# Patient Record
Sex: Female | Born: 1950
Health system: Southern US, Community
[De-identification: ages and names within clinical notes are randomized; demographics above are authoritative.]

## PROBLEM LIST (undated history)

## (undated) DIAGNOSIS — J449 Chronic obstructive pulmonary disease, unspecified: Secondary | ICD-10-CM

## (undated) DIAGNOSIS — Z8719 Personal history of other diseases of the digestive system: Secondary | ICD-10-CM

## (undated) DIAGNOSIS — M545 Low back pain, unspecified: Secondary | ICD-10-CM

## (undated) DIAGNOSIS — IMO0001 Reserved for inherently not codable concepts without codable children: Secondary | ICD-10-CM

## (undated) DIAGNOSIS — Z860100 Personal history of colon polyps, unspecified: Secondary | ICD-10-CM

## (undated) DIAGNOSIS — M1990 Unspecified osteoarthritis, unspecified site: Secondary | ICD-10-CM

## (undated) DIAGNOSIS — I10 Essential (primary) hypertension: Secondary | ICD-10-CM

## (undated) DIAGNOSIS — E669 Obesity, unspecified: Secondary | ICD-10-CM

## (undated) DIAGNOSIS — F450 Somatization disorder: Secondary | ICD-10-CM

## (undated) DIAGNOSIS — N259 Disorder resulting from impaired renal tubular function, unspecified: Secondary | ICD-10-CM

## (undated) DIAGNOSIS — R5383 Other fatigue: Secondary | ICD-10-CM

## (undated) DIAGNOSIS — J45909 Unspecified asthma, uncomplicated: Secondary | ICD-10-CM

## (undated) DIAGNOSIS — J4489 Other specified chronic obstructive pulmonary disease: Secondary | ICD-10-CM

## (undated) DIAGNOSIS — K589 Irritable bowel syndrome without diarrhea: Secondary | ICD-10-CM

## (undated) DIAGNOSIS — I639 Cerebral infarction, unspecified: Secondary | ICD-10-CM

## (undated) DIAGNOSIS — N959 Unspecified menopausal and perimenopausal disorder: Secondary | ICD-10-CM

## (undated) DIAGNOSIS — E785 Hyperlipidemia, unspecified: Secondary | ICD-10-CM

## (undated) DIAGNOSIS — E739 Lactose intolerance, unspecified: Secondary | ICD-10-CM

## (undated) DIAGNOSIS — D509 Iron deficiency anemia, unspecified: Secondary | ICD-10-CM

## (undated) DIAGNOSIS — I2510 Atherosclerotic heart disease of native coronary artery without angina pectoris: Secondary | ICD-10-CM

## (undated) DIAGNOSIS — K219 Gastro-esophageal reflux disease without esophagitis: Secondary | ICD-10-CM

## (undated) DIAGNOSIS — J309 Allergic rhinitis, unspecified: Secondary | ICD-10-CM

## (undated) DIAGNOSIS — F411 Generalized anxiety disorder: Secondary | ICD-10-CM

## (undated) DIAGNOSIS — R609 Edema, unspecified: Secondary | ICD-10-CM

## (undated) DIAGNOSIS — N63 Unspecified lump in unspecified breast: Secondary | ICD-10-CM

## (undated) DIAGNOSIS — R5381 Other malaise: Secondary | ICD-10-CM

## (undated) DIAGNOSIS — E039 Hypothyroidism, unspecified: Secondary | ICD-10-CM

## (undated) DIAGNOSIS — I739 Peripheral vascular disease, unspecified: Secondary | ICD-10-CM

## (undated) HISTORY — DX: Other specified chronic obstructive pulmonary disease: J44.89

## (undated) HISTORY — PX: REPLACEMENT TOTAL KNEE: SUR1224

## (undated) HISTORY — DX: Lactose intolerance, unspecified: E73.9

## (undated) HISTORY — DX: Unspecified lump in unspecified breast: N63.0

## (undated) HISTORY — PX: COLON SURGERY: SHX602

## (undated) HISTORY — DX: Unspecified osteoarthritis, unspecified site: M19.90

## (undated) HISTORY — DX: Hypothyroidism, unspecified: E03.9

## (undated) HISTORY — DX: Allergic rhinitis, unspecified: J30.9

## (undated) HISTORY — DX: Chronic obstructive pulmonary disease, unspecified: J44.9

## (undated) HISTORY — DX: Unspecified asthma, uncomplicated: J45.909

## (undated) HISTORY — DX: Hyperlipidemia, unspecified: E78.5

## (undated) HISTORY — DX: Irritable bowel syndrome, unspecified: K58.9

## (undated) HISTORY — DX: Other fatigue: R53.83

## (undated) HISTORY — DX: Essential (primary) hypertension: I10

## (undated) HISTORY — DX: Low back pain, unspecified: M54.50

## (undated) HISTORY — DX: Somatization disorder: F45.0

## (undated) HISTORY — DX: Low back pain: M54.5

## (undated) HISTORY — DX: Generalized anxiety disorder: F41.1

## (undated) HISTORY — DX: Atherosclerotic heart disease of native coronary artery without angina pectoris: I25.10

## (undated) HISTORY — DX: Disorder resulting from impaired renal tubular function, unspecified: N25.9

## (undated) HISTORY — DX: Peripheral vascular disease, unspecified: I73.9

## (undated) HISTORY — DX: Edema, unspecified: R60.9

## (undated) HISTORY — PX: TONSILLECTOMY: SUR1361

## (undated) HISTORY — PX: CHOLECYSTECTOMY: SHX55

## (undated) HISTORY — DX: Personal history of colon polyps, unspecified: Z86.0100

## (undated) HISTORY — DX: Reserved for inherently not codable concepts without codable children: IMO0001

## (undated) HISTORY — DX: Gastro-esophageal reflux disease without esophagitis: K21.9

## (undated) HISTORY — DX: Obesity, unspecified: E66.9

## (undated) HISTORY — PX: CORONARY STENT PLACEMENT: SHX1402

## (undated) HISTORY — DX: Other fatigue: R53.81

## (undated) HISTORY — PX: ABDOMINAL HYSTERECTOMY: SHX81

## (undated) HISTORY — DX: Personal history of colonic polyps: Z86.010

## (undated) HISTORY — PX: CORONARY ANGIOPLASTY: SHX604

## (undated) HISTORY — DX: Unspecified menopausal and perimenopausal disorder: N95.9

## (undated) HISTORY — DX: Iron deficiency anemia, unspecified: D50.9

## (undated) HISTORY — PX: OOPHORECTOMY: SHX86

## (undated) HISTORY — PX: APPENDECTOMY: SHX54

## (undated) HISTORY — DX: Personal history of other diseases of the digestive system: Z87.19

---

## 1998-10-14 ENCOUNTER — Other Ambulatory Visit: Admission: RE | Admit: 1998-10-14 | Discharge: 1998-10-14 | Payer: Self-pay | Admitting: Gastroenterology

## 1998-10-20 ENCOUNTER — Observation Stay (HOSPITAL_COMMUNITY): Admission: RE | Admit: 1998-10-20 | Discharge: 1998-10-21 | Payer: Self-pay | Admitting: General Surgery

## 1999-11-16 ENCOUNTER — Ambulatory Visit (HOSPITAL_COMMUNITY): Admission: RE | Admit: 1999-11-16 | Discharge: 1999-11-16 | Payer: Self-pay | Admitting: *Deleted

## 2000-01-15 ENCOUNTER — Emergency Department (HOSPITAL_COMMUNITY): Admission: EM | Admit: 2000-01-15 | Discharge: 2000-01-15 | Payer: Self-pay | Admitting: Internal Medicine

## 2000-01-15 ENCOUNTER — Encounter: Payer: Self-pay | Admitting: Emergency Medicine

## 2001-12-19 ENCOUNTER — Encounter: Payer: Self-pay | Admitting: Internal Medicine

## 2001-12-19 ENCOUNTER — Inpatient Hospital Stay (HOSPITAL_COMMUNITY): Admission: EM | Admit: 2001-12-19 | Discharge: 2001-12-25 | Payer: Self-pay | Admitting: Emergency Medicine

## 2001-12-20 ENCOUNTER — Encounter: Payer: Self-pay | Admitting: *Deleted

## 2002-01-28 ENCOUNTER — Encounter: Admission: RE | Admit: 2002-01-28 | Discharge: 2002-01-28 | Payer: Self-pay | Admitting: Internal Medicine

## 2002-01-28 ENCOUNTER — Encounter: Payer: Self-pay | Admitting: Internal Medicine

## 2002-05-20 ENCOUNTER — Ambulatory Visit (HOSPITAL_COMMUNITY): Admission: RE | Admit: 2002-05-20 | Discharge: 2002-05-20 | Payer: Self-pay | Admitting: Specialist

## 2004-10-15 ENCOUNTER — Emergency Department (HOSPITAL_COMMUNITY): Admission: EM | Admit: 2004-10-15 | Discharge: 2004-10-15 | Payer: Self-pay

## 2004-10-23 ENCOUNTER — Ambulatory Visit (HOSPITAL_COMMUNITY): Admission: RE | Admit: 2004-10-23 | Discharge: 2004-10-23 | Payer: Self-pay | Admitting: Urology

## 2004-12-08 ENCOUNTER — Ambulatory Visit: Payer: Self-pay | Admitting: Internal Medicine

## 2004-12-15 ENCOUNTER — Ambulatory Visit: Payer: Self-pay | Admitting: Internal Medicine

## 2004-12-29 ENCOUNTER — Ambulatory Visit: Payer: Self-pay

## 2005-08-07 ENCOUNTER — Emergency Department (HOSPITAL_COMMUNITY): Admission: EM | Admit: 2005-08-07 | Discharge: 2005-08-07 | Payer: Self-pay | Admitting: Emergency Medicine

## 2006-01-18 ENCOUNTER — Ambulatory Visit: Payer: Self-pay | Admitting: Internal Medicine

## 2006-02-15 ENCOUNTER — Ambulatory Visit: Payer: Self-pay | Admitting: Internal Medicine

## 2006-02-21 ENCOUNTER — Ambulatory Visit: Payer: Self-pay | Admitting: Internal Medicine

## 2006-03-22 ENCOUNTER — Ambulatory Visit: Payer: Self-pay | Admitting: Internal Medicine

## 2006-03-25 ENCOUNTER — Inpatient Hospital Stay (HOSPITAL_COMMUNITY): Admission: RE | Admit: 2006-03-25 | Discharge: 2006-03-28 | Payer: Self-pay | Admitting: Orthopedic Surgery

## 2006-06-12 ENCOUNTER — Ambulatory Visit: Payer: Self-pay | Admitting: Cardiology

## 2006-06-12 ENCOUNTER — Inpatient Hospital Stay (HOSPITAL_COMMUNITY): Admission: EM | Admit: 2006-06-12 | Discharge: 2006-06-16 | Payer: Self-pay | Admitting: Cardiology

## 2006-06-19 ENCOUNTER — Ambulatory Visit: Payer: Self-pay | Admitting: Cardiology

## 2006-06-19 ENCOUNTER — Observation Stay (HOSPITAL_COMMUNITY): Admission: EM | Admit: 2006-06-19 | Discharge: 2006-06-20 | Payer: Self-pay | Admitting: Emergency Medicine

## 2006-07-05 ENCOUNTER — Ambulatory Visit: Payer: Self-pay | Admitting: Cardiology

## 2006-07-19 ENCOUNTER — Ambulatory Visit: Payer: Self-pay | Admitting: Cardiology

## 2006-12-13 ENCOUNTER — Ambulatory Visit: Payer: Self-pay | Admitting: Cardiology

## 2006-12-18 ENCOUNTER — Ambulatory Visit: Payer: Self-pay | Admitting: Cardiology

## 2006-12-18 LAB — CONVERTED CEMR LAB
Basophils Absolute: 0.2 10*3/uL — ABNORMAL HIGH (ref 0.0–0.1)
Basophils Relative: 2 % — ABNORMAL HIGH (ref 0.0–1.0)
CO2: 28 meq/L (ref 19–32)
Chloride: 105 meq/L (ref 96–112)
Creatinine, Ser: 0.8 mg/dL (ref 0.4–1.2)
Eosinophils Relative: 1.7 % (ref 0.0–5.0)
Glucose, Bld: 109 mg/dL — ABNORMAL HIGH (ref 70–99)
HCT: 38 % (ref 36.0–46.0)
Hemoglobin: 12.9 g/dL (ref 12.0–15.0)
INR: 0.7 — ABNORMAL LOW (ref 0.9–2.0)
MCHC: 33.8 g/dL (ref 30.0–36.0)
Monocytes Absolute: 0.2 10*3/uL (ref 0.2–0.7)
Neutrophils Relative %: 56.9 % (ref 43.0–77.0)
Potassium: 4.3 meq/L (ref 3.5–5.1)
RBC: 4.59 M/uL (ref 3.87–5.11)
RDW: 15.2 % — ABNORMAL HIGH (ref 11.5–14.6)
Sodium: 140 meq/L (ref 135–145)
WBC: 8.1 10*3/uL (ref 4.5–10.5)
aPTT: 31.7 s (ref 26.5–36.5)

## 2006-12-20 ENCOUNTER — Inpatient Hospital Stay (HOSPITAL_BASED_OUTPATIENT_CLINIC_OR_DEPARTMENT_OTHER): Admission: RE | Admit: 2006-12-20 | Discharge: 2006-12-20 | Payer: Self-pay | Admitting: Cardiology

## 2006-12-20 ENCOUNTER — Ambulatory Visit: Payer: Self-pay | Admitting: Cardiology

## 2007-01-02 ENCOUNTER — Ambulatory Visit: Payer: Self-pay | Admitting: Cardiology

## 2007-01-31 ENCOUNTER — Ambulatory Visit: Payer: Self-pay | Admitting: Cardiology

## 2007-02-12 ENCOUNTER — Ambulatory Visit: Payer: Self-pay | Admitting: Cardiology

## 2007-02-21 ENCOUNTER — Ambulatory Visit: Payer: Self-pay | Admitting: Internal Medicine

## 2007-02-21 LAB — CONVERTED CEMR LAB
ALT: 22 units/L (ref 0–40)
AST: 20 units/L (ref 0–37)
Albumin: 3.1 g/dL — ABNORMAL LOW (ref 3.5–5.2)
Alkaline Phosphatase: 85 units/L (ref 39–117)
Basophils Absolute: 0 10*3/uL (ref 0.0–0.1)
Basophils Relative: 0 % (ref 0.0–1.0)
Calcium: 8.8 mg/dL (ref 8.4–10.5)
Chloride: 109 meq/L (ref 96–112)
Creatinine, Ser: 0.7 mg/dL (ref 0.4–1.2)
Crystals: NEGATIVE
GFR calc non Af Amer: 92 mL/min
HCT: 35.8 % — ABNORMAL LOW (ref 36.0–46.0)
Hemoglobin: 11.9 g/dL — ABNORMAL LOW (ref 12.0–15.0)
MCHC: 33.3 g/dL (ref 30.0–36.0)
Monocytes Absolute: 0.7 10*3/uL (ref 0.2–0.7)
Neutrophils Relative %: 76.4 % (ref 43.0–77.0)
RBC: 4.26 M/uL (ref 3.87–5.11)
RDW: 13.6 % (ref 11.5–14.6)
Specific Gravity, Urine: >=1.03 (ref 1.000–1.03)
Total CHOL/HDL Ratio: 3.6
Total Protein, Urine: 30 mg/dL — AB
Triglycerides: 115 mg/dL (ref 0–149)
VLDL: 23 mg/dL (ref 0–40)
WBC: 11.3 10*3/uL — ABNORMAL HIGH (ref 4.5–10.5)
pH: 6 (ref 5.0–8.0)

## 2007-02-28 ENCOUNTER — Ambulatory Visit: Payer: Self-pay | Admitting: Internal Medicine

## 2007-04-11 ENCOUNTER — Ambulatory Visit: Payer: Self-pay | Admitting: Cardiology

## 2007-06-17 ENCOUNTER — Ambulatory Visit: Payer: Self-pay | Admitting: Cardiovascular Disease

## 2007-06-18 ENCOUNTER — Inpatient Hospital Stay (HOSPITAL_COMMUNITY): Admission: EM | Admit: 2007-06-18 | Discharge: 2007-06-20 | Payer: Self-pay | Admitting: Emergency Medicine

## 2007-06-18 ENCOUNTER — Ambulatory Visit: Payer: Self-pay | Admitting: Cardiology

## 2007-07-02 ENCOUNTER — Ambulatory Visit: Payer: Self-pay | Admitting: Cardiovascular Disease

## 2007-07-02 LAB — CONVERTED CEMR LAB: CK-MB: 1.5 ng/mL (ref 0.3–4.0)

## 2007-07-09 ENCOUNTER — Ambulatory Visit: Payer: Self-pay | Admitting: Internal Medicine

## 2007-07-16 ENCOUNTER — Ambulatory Visit: Payer: Self-pay | Admitting: Cardiovascular Disease

## 2007-08-11 ENCOUNTER — Inpatient Hospital Stay (HOSPITAL_COMMUNITY): Admission: RE | Admit: 2007-08-11 | Discharge: 2007-08-15 | Payer: Self-pay | Admitting: Orthopedic Surgery

## 2007-08-11 ENCOUNTER — Ambulatory Visit: Payer: Self-pay | Admitting: Cardiovascular Disease

## 2007-08-20 ENCOUNTER — Telehealth: Payer: Self-pay | Admitting: Internal Medicine

## 2007-08-20 ENCOUNTER — Telehealth (INDEPENDENT_AMBULATORY_CARE_PROVIDER_SITE_OTHER): Payer: Self-pay | Admitting: *Deleted

## 2007-08-23 ENCOUNTER — Encounter: Payer: Self-pay | Admitting: Internal Medicine

## 2007-08-23 DIAGNOSIS — E669 Obesity, unspecified: Secondary | ICD-10-CM | POA: Insufficient documentation

## 2007-08-23 DIAGNOSIS — I2510 Atherosclerotic heart disease of native coronary artery without angina pectoris: Secondary | ICD-10-CM

## 2007-08-23 DIAGNOSIS — J4489 Other specified chronic obstructive pulmonary disease: Secondary | ICD-10-CM | POA: Insufficient documentation

## 2007-08-23 DIAGNOSIS — E039 Hypothyroidism, unspecified: Secondary | ICD-10-CM | POA: Insufficient documentation

## 2007-08-23 DIAGNOSIS — F450 Somatization disorder: Secondary | ICD-10-CM

## 2007-08-23 DIAGNOSIS — M1990 Unspecified osteoarthritis, unspecified site: Secondary | ICD-10-CM | POA: Insufficient documentation

## 2007-08-23 DIAGNOSIS — E785 Hyperlipidemia, unspecified: Secondary | ICD-10-CM

## 2007-08-23 DIAGNOSIS — I252 Old myocardial infarction: Secondary | ICD-10-CM | POA: Insufficient documentation

## 2007-08-23 DIAGNOSIS — M545 Low back pain: Secondary | ICD-10-CM

## 2007-08-23 DIAGNOSIS — I10 Essential (primary) hypertension: Secondary | ICD-10-CM | POA: Insufficient documentation

## 2007-08-23 DIAGNOSIS — F411 Generalized anxiety disorder: Secondary | ICD-10-CM | POA: Insufficient documentation

## 2007-08-23 DIAGNOSIS — K589 Irritable bowel syndrome without diarrhea: Secondary | ICD-10-CM | POA: Insufficient documentation

## 2007-08-23 DIAGNOSIS — J45909 Unspecified asthma, uncomplicated: Secondary | ICD-10-CM | POA: Insufficient documentation

## 2007-08-23 DIAGNOSIS — J449 Chronic obstructive pulmonary disease, unspecified: Secondary | ICD-10-CM | POA: Insufficient documentation

## 2007-08-23 DIAGNOSIS — IMO0001 Reserved for inherently not codable concepts without codable children: Secondary | ICD-10-CM

## 2007-08-23 HISTORY — DX: Essential (primary) hypertension: I10

## 2007-08-23 HISTORY — DX: Somatization disorder: F45.0

## 2007-08-23 HISTORY — DX: Hyperlipidemia, unspecified: E78.5

## 2008-06-07 ENCOUNTER — Ambulatory Visit: Payer: Self-pay | Admitting: Internal Medicine

## 2008-06-07 ENCOUNTER — Telehealth (INDEPENDENT_AMBULATORY_CARE_PROVIDER_SITE_OTHER): Payer: Self-pay | Admitting: *Deleted

## 2008-06-07 DIAGNOSIS — E739 Lactose intolerance, unspecified: Secondary | ICD-10-CM

## 2008-06-07 DIAGNOSIS — Z8719 Personal history of other diseases of the digestive system: Secondary | ICD-10-CM

## 2008-06-07 DIAGNOSIS — R5383 Other fatigue: Secondary | ICD-10-CM

## 2008-06-07 DIAGNOSIS — K219 Gastro-esophageal reflux disease without esophagitis: Secondary | ICD-10-CM

## 2008-06-07 DIAGNOSIS — J309 Allergic rhinitis, unspecified: Secondary | ICD-10-CM | POA: Insufficient documentation

## 2008-06-07 DIAGNOSIS — R5381 Other malaise: Secondary | ICD-10-CM

## 2008-06-07 DIAGNOSIS — R079 Chest pain, unspecified: Secondary | ICD-10-CM | POA: Insufficient documentation

## 2008-06-07 DIAGNOSIS — D509 Iron deficiency anemia, unspecified: Secondary | ICD-10-CM | POA: Insufficient documentation

## 2008-06-07 DIAGNOSIS — Z860100 Personal history of colon polyps, unspecified: Secondary | ICD-10-CM | POA: Insufficient documentation

## 2008-06-07 LAB — CONVERTED CEMR LAB
Albumin: 3.8 g/dL (ref 3.5–5.2)
Alkaline Phosphatase: 70 units/L (ref 39–117)
BUN: 8 mg/dL (ref 6–23)
Basophils Absolute: 0 10*3/uL (ref 0.0–0.1)
Cholesterol: 262 mg/dL (ref 0–200)
Folate: 6.8 ng/mL
GFR calc Af Amer: 111 mL/min
GFR calc non Af Amer: 92 mL/min
HDL: 41.9 mg/dL (ref >39.0)
Hemoglobin: 13 g/dL (ref 12.0–15.0)
Hgb A1c MFr Bld: 6.5 % — ABNORMAL HIGH (ref 4.6–6.0)
Iron: 31 ug/dL — ABNORMAL LOW (ref 42–145)
Lymphocytes Relative: 31.6 % (ref 12.0–46.0)
MCHC: 34 g/dL (ref 30.0–36.0)
Neutro Abs: 4.8 10*3/uL (ref 1.4–7.7)
Neutrophils Relative %: 58.7 % (ref 43.0–77.0)
Platelets: 373 10*3/uL (ref 150–400)
Potassium: 4.6 meq/L (ref 3.5–5.1)
RDW: 14.3 % (ref 11.5–14.6)
Saturation Ratios: 7.3 % — ABNORMAL LOW (ref 20.0–50.0)
Triglycerides: 176 mg/dL — ABNORMAL HIGH (ref 0–149)
VLDL: 35 mg/dL (ref 0–40)
Vitamin B-12: 221 pg/mL (ref 211–911)

## 2008-06-08 ENCOUNTER — Telehealth (INDEPENDENT_AMBULATORY_CARE_PROVIDER_SITE_OTHER): Payer: Self-pay | Admitting: *Deleted

## 2008-06-14 LAB — CONVERTED CEMR LAB: Vit D, 1,25-Dihydroxy: 17 — ABNORMAL LOW (ref 30–89)

## 2008-06-24 ENCOUNTER — Ambulatory Visit: Payer: Self-pay | Admitting: Cardiovascular Disease

## 2008-08-04 ENCOUNTER — Encounter: Payer: Self-pay | Admitting: Internal Medicine

## 2008-08-06 ENCOUNTER — Encounter: Admission: RE | Admit: 2008-08-06 | Discharge: 2008-08-06 | Payer: Self-pay | Admitting: Gastroenterology

## 2008-08-12 ENCOUNTER — Encounter: Payer: Self-pay | Admitting: Internal Medicine

## 2008-08-18 ENCOUNTER — Encounter: Payer: Self-pay | Admitting: Internal Medicine

## 2008-08-25 ENCOUNTER — Encounter: Payer: Self-pay | Admitting: Internal Medicine

## 2008-08-27 ENCOUNTER — Encounter: Admission: RE | Admit: 2008-08-27 | Discharge: 2008-08-27 | Payer: Self-pay | Admitting: Gastroenterology

## 2008-09-02 ENCOUNTER — Encounter: Admission: RE | Admit: 2008-09-02 | Discharge: 2008-09-02 | Payer: Self-pay | Admitting: Gastroenterology

## 2008-09-02 ENCOUNTER — Encounter: Payer: Self-pay | Admitting: Internal Medicine

## 2008-09-08 ENCOUNTER — Encounter: Payer: Self-pay | Admitting: Internal Medicine

## 2008-09-13 ENCOUNTER — Encounter: Payer: Self-pay | Admitting: Internal Medicine

## 2008-09-17 ENCOUNTER — Ambulatory Visit: Payer: Self-pay | Admitting: Cardiovascular Disease

## 2008-11-04 ENCOUNTER — Encounter (INDEPENDENT_AMBULATORY_CARE_PROVIDER_SITE_OTHER): Payer: Self-pay | Admitting: General Surgery

## 2008-11-04 ENCOUNTER — Inpatient Hospital Stay (HOSPITAL_COMMUNITY): Admission: RE | Admit: 2008-11-04 | Discharge: 2008-11-09 | Payer: Self-pay | Admitting: General Surgery

## 2008-11-29 ENCOUNTER — Inpatient Hospital Stay (HOSPITAL_COMMUNITY): Admission: EM | Admit: 2008-11-29 | Discharge: 2008-12-02 | Payer: Self-pay | Admitting: Emergency Medicine

## 2008-11-29 ENCOUNTER — Encounter: Payer: Self-pay | Admitting: Internal Medicine

## 2009-01-03 ENCOUNTER — Telehealth (INDEPENDENT_AMBULATORY_CARE_PROVIDER_SITE_OTHER): Payer: Self-pay | Admitting: *Deleted

## 2009-02-15 ENCOUNTER — Ambulatory Visit: Payer: Self-pay | Admitting: Internal Medicine

## 2009-02-15 ENCOUNTER — Observation Stay (HOSPITAL_COMMUNITY): Admission: EM | Admit: 2009-02-15 | Discharge: 2009-02-17 | Payer: Self-pay | Admitting: Emergency Medicine

## 2009-02-16 ENCOUNTER — Encounter: Payer: Self-pay | Admitting: Internal Medicine

## 2009-02-18 ENCOUNTER — Telehealth (INDEPENDENT_AMBULATORY_CARE_PROVIDER_SITE_OTHER): Payer: Self-pay | Admitting: *Deleted

## 2009-02-28 ENCOUNTER — Telehealth (INDEPENDENT_AMBULATORY_CARE_PROVIDER_SITE_OTHER): Payer: Self-pay | Admitting: *Deleted

## 2009-03-01 ENCOUNTER — Encounter: Payer: Self-pay | Admitting: Cardiovascular Disease

## 2009-03-01 ENCOUNTER — Ambulatory Visit: Payer: Self-pay

## 2009-03-02 ENCOUNTER — Telehealth (INDEPENDENT_AMBULATORY_CARE_PROVIDER_SITE_OTHER): Payer: Self-pay | Admitting: *Deleted

## 2009-03-02 DIAGNOSIS — E876 Hypokalemia: Secondary | ICD-10-CM | POA: Insufficient documentation

## 2009-03-02 DIAGNOSIS — N259 Disorder resulting from impaired renal tubular function, unspecified: Secondary | ICD-10-CM

## 2009-03-02 HISTORY — DX: Disorder resulting from impaired renal tubular function, unspecified: N25.9

## 2009-03-03 ENCOUNTER — Ambulatory Visit: Payer: Self-pay | Admitting: Cardiovascular Disease

## 2009-03-03 DIAGNOSIS — R0602 Shortness of breath: Secondary | ICD-10-CM | POA: Insufficient documentation

## 2009-03-07 LAB — CONVERTED CEMR LAB
CO2: 30 meq/L (ref 19–32)
Calcium: 9.4 mg/dL (ref 8.4–10.5)
Creatinine, Ser: 0.7 mg/dL (ref 0.4–1.2)
Glucose, Bld: 104 mg/dL — ABNORMAL HIGH (ref 70–99)

## 2009-04-27 ENCOUNTER — Encounter: Admission: RE | Admit: 2009-04-27 | Discharge: 2009-04-27 | Payer: Self-pay | Admitting: General Surgery

## 2009-04-27 ENCOUNTER — Encounter: Payer: Self-pay | Admitting: Internal Medicine

## 2009-05-13 ENCOUNTER — Encounter: Payer: Self-pay | Admitting: Internal Medicine

## 2009-05-16 ENCOUNTER — Encounter: Payer: Self-pay | Admitting: Internal Medicine

## 2009-06-15 ENCOUNTER — Telehealth: Payer: Self-pay | Admitting: Internal Medicine

## 2009-06-15 ENCOUNTER — Encounter: Payer: Self-pay | Admitting: Internal Medicine

## 2009-06-28 ENCOUNTER — Telehealth: Payer: Self-pay | Admitting: Internal Medicine

## 2009-07-01 ENCOUNTER — Telehealth: Payer: Self-pay | Admitting: Cardiovascular Disease

## 2009-07-08 ENCOUNTER — Ambulatory Visit: Payer: Self-pay | Admitting: Internal Medicine

## 2009-07-08 LAB — CONVERTED CEMR LAB
AST: 28 units/L (ref 0–37)
Alkaline Phosphatase: 40 units/L (ref 39–117)
BUN: 21 mg/dL (ref 6–23)
Eosinophils Absolute: 0.2 10*3/uL (ref 0.0–0.7)
GFR calc non Af Amer: 68.31 mL/min (ref >60)
Ketones, ur: NEGATIVE mg/dL
MCHC: 33.4 g/dL (ref 30.0–36.0)
MCV: 88 fL (ref 78.0–100.0)
Monocytes Absolute: 0.5 10*3/uL (ref 0.1–1.0)
Neutrophils Relative %: 53.4 % (ref 43.0–77.0)
Nitrite: NEGATIVE
Platelets: 290 10*3/uL (ref 150.0–400.0)
Potassium: 4.3 meq/L (ref 3.5–5.1)
Sodium: 144 meq/L (ref 135–145)
Specific Gravity, Urine: 1.025 (ref 1.000–1.030)
TSH: 2.79 microintl units/mL (ref 0.35–5.50)
Total Bilirubin: 0.5 mg/dL (ref 0.3–1.2)
Total CHOL/HDL Ratio: 6
Total Protein, Urine: NEGATIVE mg/dL
pH: 6 (ref 5.0–8.0)

## 2009-07-15 ENCOUNTER — Ambulatory Visit: Payer: Self-pay | Admitting: Internal Medicine

## 2009-07-15 DIAGNOSIS — I739 Peripheral vascular disease, unspecified: Secondary | ICD-10-CM

## 2009-07-15 DIAGNOSIS — N959 Unspecified menopausal and perimenopausal disorder: Secondary | ICD-10-CM | POA: Insufficient documentation

## 2009-07-15 HISTORY — DX: Peripheral vascular disease, unspecified: I73.9

## 2009-07-19 ENCOUNTER — Ambulatory Visit: Payer: Self-pay | Admitting: Cardiovascular Disease

## 2009-08-19 ENCOUNTER — Telehealth: Payer: Self-pay | Admitting: Internal Medicine

## 2009-08-19 DIAGNOSIS — N63 Unspecified lump in unspecified breast: Secondary | ICD-10-CM | POA: Insufficient documentation

## 2009-08-26 ENCOUNTER — Encounter: Admission: RE | Admit: 2009-08-26 | Discharge: 2009-08-26 | Payer: Self-pay | Admitting: Internal Medicine

## 2009-11-30 ENCOUNTER — Encounter (INDEPENDENT_AMBULATORY_CARE_PROVIDER_SITE_OTHER): Payer: Self-pay | Admitting: *Deleted

## 2010-07-18 ENCOUNTER — Telehealth: Payer: Self-pay | Admitting: Internal Medicine

## 2010-08-22 ENCOUNTER — Ambulatory Visit: Payer: Self-pay | Admitting: Internal Medicine

## 2010-08-22 LAB — CONVERTED CEMR LAB
Albumin: 3.9 g/dL (ref 3.5–5.2)
Alkaline Phosphatase: 74 units/L (ref 39–117)
Basophils Relative: 0.7 % (ref 0.0–3.0)
CO2: 27 meq/L (ref 19–32)
Chloride: 106 meq/L (ref 96–112)
Direct LDL: 168.9 mg/dL
Eosinophils Absolute: 0.2 10*3/uL (ref 0.0–0.7)
HCT: 38.4 % (ref 36.0–46.0)
Hemoglobin, Urine: NEGATIVE
Hemoglobin: 13 g/dL (ref 12.0–15.0)
Lymphs Abs: 2.5 10*3/uL (ref 0.7–4.0)
MCHC: 33.8 g/dL (ref 30.0–36.0)
MCV: 85.9 fL (ref 78.0–100.0)
Monocytes Absolute: 0.5 10*3/uL (ref 0.1–1.0)
Neutro Abs: 5.2 10*3/uL (ref 1.4–7.7)
RBC: 4.47 M/uL (ref 3.87–5.11)
Sodium: 141 meq/L (ref 135–145)
Total CHOL/HDL Ratio: 6
Total Protein: 7 g/dL (ref 6.0–8.3)
Urine Glucose: NEGATIVE mg/dL
Urobilinogen, UA: 0.2 (ref 0.0–1.0)

## 2010-08-28 ENCOUNTER — Ambulatory Visit: Payer: Self-pay | Admitting: Internal Medicine

## 2010-08-28 ENCOUNTER — Encounter: Payer: Self-pay | Admitting: Internal Medicine

## 2010-08-28 DIAGNOSIS — R609 Edema, unspecified: Secondary | ICD-10-CM

## 2010-11-26 ENCOUNTER — Encounter: Payer: Self-pay | Admitting: Internal Medicine

## 2010-12-07 NOTE — Letter (Signed)
Summary: Appointment - Reminder 2  [facility], Main Office  [address]   [place], [place] [address]   Phone: [phone]  Fax: [phone]     November 30, 2009 MRN: [id number]   [name] [address] [place], [place]  [address]   Dear [name],  Our records indicate that it is time to schedule a follow-up appointment with Dr. Eden Emms. It is very important that we reach you to schedule this appointment. We look forward to participating in your health care needs. Please contact us at the number listed above at your earliest convenience to schedule your appointment.  If you are unable to make an appointment at this time, give [place] a call so we can update our records.   Sincerely,   Migdalia Dk [facility] Scheduling Team

## 2010-12-07 NOTE — Assessment & Plan Note (Signed)
Summary: CPX-LB   Vital Signs:  Patient profile:   60 year old female Height:      60 inches Weight:      208.13 pounds BMI:     40.79 O2 Sat:      94 % on Room air Temp:     98.9 degrees F oral Pulse rate:   83 / minute BP sitting:   122 / 70  (left arm) Cuff size:   regular  Vitals Entered By: Zella Ball Ewing CMA Duncan Dull) (August 28, 2010 3:16 PM)  O2 Flow:  Room air  CC: Adult physical/RE   CC:  Adult physical/RE.  History of Present Illness: overall doing well; has slight pedal edema in the past few months that goes away at night;  Pt denies other CP, worsening sob, doe, wheezing, orthopnea, pnd, palps, dizziness or syncope .  Pt denies new neuro symptoms such as headache, facial or extremity weakness  No fever, wt loss, night sweats, loss of appetite or other constitutional symptoms Pt denies polydipsia, polyuria.  Overall good compliance with meds, trying to follow low chol  diet, wt stable, little excercise however .  hard to lose wt.  Mentions her meds will be more expensive after nov 1 when she loses her incurance coverage as husband is losing his job.  Pt states good ability with ADL's, low fall risk, home safety reviewed and adequate, no significant change in hearing or vision, trying to follow lower chol diet, and occasionally active only with regular excercise.   Preventive Screening-Counseling & Management      Drug Use:  no.    Problems Prior to Update: 1)  Peripheral Edema  (ICD-782.3) 2)  Breast Mass, Left  (ICD-611.72) 3)  Coronary Artery Disease  (ICD-414.00) 4)  Myocardial Infarction, Hx of  (ICD-412) 5)  Hyperlipidemia  (ICD-272.4) 6)  Renal Insufficiency  (ICD-588.9) 7)  Peripheral Vascular Disease  (ICD-443.9) 8)  Shortness of Breath  (ICD-786.05) 9)  Hypertension  (ICD-401.9) 10)  Preventive Health Care  (ICD-V70.0) 11)  Gerd  (ICD-530.81) 12)  Allergic Rhinitis  (ICD-477.9) 13)  Diverticulitis, Hx of  (ICD-V12.79) 14)  Fatigue, Chronic   (ICD-780.79) 15)  Hepatotoxicity, Drug-induced, Risk of  (ICD-V58.69) 16)  Anemia-iron Deficiency  (ICD-280.9) 17)  Chest Pain  (ICD-786.50) 18)  Glucose Intolerance  (ICD-271.3) 19)  Obesity  (ICD-278.00) 20)  Low Back Pain  (ICD-724.2) 21)  Somatization Disorder  (ICD-300.81) 22)  Fibromyalgia  (ICD-729.1) 23)  Ibs  (ICD-564.1) 24)  Osteoarthritis  (ICD-715.90) 25)  Hypothyroidism  (ICD-244.9) 26)  COPD  (ICD-496) 27)  Asthma  (ICD-493.90) 28)  Anxiety  (ICD-300.00) 29)  Colonic Polyps, Hx of  (ICD-V12.72) 30)  Hypokalemia  (ICD-276.8) 31)  Menopausal Disorder  (ICD-627.9)  Medications Prior to Update: 1)  Advair Diskus 250-50 Mcg/dose Misc (Fluticasone-Salmeterol) .... Inhale 1 Puff As Directed Twice A Day 2)  Levothyroxine Sodium 125 Mcg Tabs (Levothyroxine Sodium) .... Take 1 Tablet By Mouth Once A  Day 3)  Proair Hfa 108 (90 Base) Mcg/act Aers (Albuterol Sulfate) .... Inhale 2 Puff Using Inhaler Four Times A Day 4)  Skelaxin 800 Mg  Tabs (Metaxalone) .Marland Kitchen.. 1 By Mouth Two Times A Day As Needed 5)  Zetia 10 Mg  Tabs (Ezetimibe) .... Take 1 Tablet By Mouth Once A Day 6)  Plavix 75 Mg  Tabs (Clopidogrel Bisulfate) .... Take 1 Tablet By Mouth Once A Day 7)  Nitrostat 0.4 Mg  Subl (Nitroglycerin) .... Use Asd As Needed 8)  Isosorbide Mononitrate Cr 60 Mg Xr24h-Tab (Isosorbide Mononitrate) .... Take One Tablet By Mouth Daily 9)  Cozaar 100 Mg Tabs (Losartan Potassium) .Marland Kitchen.. 1 By Mouth Once Daily 10)  Carvedilol 25 Mg  Tabs (Carvedilol) .... 2 By Mouth  Two Times A Day 11)  Omeprazole 20 Mg  Cpdr (Omeprazole) .Marland Kitchen.. 1 By Mouth Two Times A Day 12)  Aspirin Ec 325 Mg Tbec (Aspirin) .... Take One Tablet By Mouth Daily 13)  Tramadol Hcl 50 Mg Tabs (Tramadol Hcl) .Marland Kitchen.. 1 By Mouth Qid As Needed 14)  Nystatin 100000 Unit/ml Susp (Nystatin) .... 5 Cc By Mouth Qid As Needed 15)  Motion Sickness 50 Mg Tabs (Dimenhydrinate) .... As Needed 16)  Singulair 10 Mg Tabs (Montelukast Sodium) .Marland Kitchen.. 1 Tab By  Mouth Once Daily 17)  Ultram 50 Mg Tabs (Tramadol Hcl) .... As Needed  Current Medications (verified): 1)  Advair Diskus 250-50 Mcg/dose Misc (Fluticasone-Salmeterol) .... Inhale 1 Puff As Directed Twice A Day 2)  Levothyroxine Sodium 125 Mcg Tabs (Levothyroxine Sodium) .... Take 1 Tablet By Mouth Once A  Day 3)  Proair Hfa 108 (90 Base) Mcg/act Aers (Albuterol Sulfate) .... Inhale 2 Puff Using Inhaler Four Times A Day 4)  Skelaxin 800 Mg  Tabs (Metaxalone) .Marland Kitchen.. 1 By Mouth Two Times A Day As Needed 5)  Zetia 10 Mg  Tabs (Ezetimibe) .... Take 1 Tablet By Mouth Once A Day 6)  Plavix 75 Mg  Tabs (Clopidogrel Bisulfate) .... Take 1 Tablet By Mouth Once A Day 7)  Nitrostat 0.4 Mg  Subl (Nitroglycerin) .... Use Asd As Needed 8)  Isosorbide Mononitrate Cr 60 Mg Xr24h-Tab (Isosorbide Mononitrate) .... Take One Tablet By Mouth Daily 9)  Cozaar 100 Mg Tabs (Losartan Potassium) .Marland Kitchen.. 1 By Mouth Once Daily 10)  Carvedilol 25 Mg  Tabs (Carvedilol) .... 2 By Mouth  Two Times A Day 11)  Omeprazole 20 Mg  Cpdr (Omeprazole) .Marland Kitchen.. 1 By Mouth Two Times A Day 12)  Aspirin Ec 325 Mg Tbec (Aspirin) .... Take One Tablet By Mouth Daily 13)  Tramadol Hcl 50 Mg Tabs (Tramadol Hcl) .Marland Kitchen.. 1 By Mouth Qid As Needed 14)  Motion Sickness 50 Mg Tabs (Dimenhydrinate) .... As Needed 15)  Singulair 10 Mg Tabs (Montelukast Sodium) .Marland Kitchen.. 1 Tab By Mouth Once Daily 16)  Hydrochlorothiazide 12.5 Mg Caps (Hydrochlorothiazide) .Marland Kitchen.. 1po Once Daily  Allergies (verified): 1)  ! Sulfa 2)  ! * Crestor 3)  ! Lipitor 4)  ! Pravachol 5)  ! * Lovastatin 6)  ! Simvastatin  Past History:  Past Medical History: Last updated: 07/15/2009 Current Problems:  HYPERTENSION (ICD-401.9) CORONARY ARTERY DISEASE (ICD-414.00) PREVENTIVE HEALTH CARE (ICD-V70.0) GERD (ICD-530.81) ALLERGIC RHINITIS (ICD-477.9) DIVERTICULITIS, HX OF (ICD-V12.79) FATIGUE, CHRONIC (ICD-780.79) HEPATOTOXICITY, DRUG-INDUCED, RISK OF (ICD-V58.69) ANEMIA-IRON DEFICIENCY  (ICD-280.9) CHEST PAIN (ICD-786.50) GLUCOSE INTOLERANCE (ICD-271.3) OBESITY (ICD-278.00) MYOCARDIAL INFARCTION, HX OF (ICD-412) LOW BACK PAIN (ICD-724.2) SOMATIZATION DISORDER (ICD-300.81) FIBROMYALGIA (ICD-729.1) IBS (ICD-564.1) OSTEOARTHRITIS (ICD-715.90) HYPOTHYROIDISM (ICD-244.9) HYPERLIPIDEMIA (ICD-272.4) COPD (ICD-496) ASTHMA (ICD-493.90) ANXIETY (ICD-300.00) COLONIC POLYPS, HX OF (ICD-V12.72) RENAL INSUFFICIENCY (ICD-588.9) HYPOKALEMIA (ICD-276.8) Hyperlipidemia Hypertension Hypothyroidism Asthma COPD Peripheral vascular disease  Past Surgical History: Last updated: 07/15/2009 PTCA/stent X2 Hysterectomy Cholecystectomy Oophorectomy - bilat  s/p bilat knee replacements Significant for coronary artery disease status post drug-eluting stent x2 in 2007.  She has repeated catheterization in 2008 which showed patent stents.   status post colon resection with complication of infection and with open wound healing by secondary healing. s/p partial colon resection for diverticulitis complication 01/2009  Family History: Last updated: 06/07/2008 heart disease  Social History: Last updated: 08/28/2010 Married 2 children work - unemployed Never Smoked Alcohol use-no Drug use-no  Risk Factors: Smoking Status: never (06/07/2008)  Family History: Reviewed history from 06/07/2008 and no changes required. heart disease  Social History: Reviewed history from 06/07/2008 and no changes required. Married 2 children work - unemployed Never Smoked Alcohol use-no Drug use-no Drug Use:  no  Review of Systems  The patient denies anorexia, fever, vision loss, decreased hearing, hoarseness, chest pain, syncope, dyspnea on exertion, peripheral edema, prolonged cough, headaches, hemoptysis, abdominal pain, melena, hematochezia, severe indigestion/heartburn, hematuria, muscle weakness, suspicious skin lesions, transient blindness, difficulty walking, depression, unusual  weight change, abnormal bleeding, enlarged lymph nodes, and angioedema.         all otherwise negative per pt -    Physical Exam  General:  alert and overweight-appearing.   Head:  normocephalic and atraumatic.   Eyes:  vision grossly intact, pupils equal, and pupils round.   Ears:  R ear normal and L ear normal.   Nose:  no external deformity and no nasal discharge.   Mouth:  no gingival abnormalities and pharynx pink and moist.   Neck:  supple and no masses.   Lungs:  normal respiratory effort and normal breath sounds.   Heart:  normal rate and regular rhythm.   Abdomen:  soft, non-tender, and normal bowel sounds.   Msk:  no joint tenderness and no joint swelling.   Extremities:  trace to 1+ pedal edema bilat, no ulcers Neurologic:  cranial nerves II-XII intact and strength normal in all extremities.     Impression & Recommendations:  Problem # 1:  Preventive Health Care (ICD-V70.0) Overall doing well, age appropriate education and counseling updated, referral for preventive services and immunizations addressed, dietary counseling and smoking status adressed , most recent labs reviewed with pt, ecg reviewed Orders: EKG w/ Interpretation (93000) I have personally reviewed and have noted 1.   The patient's medical and social history 2.   Their use of alcohol, tobacco or illicit drugs 3.   Their current medications and supplements 4.   The patient's functional ability including ADL's, fall risks, home safety risks and hearing or visual             impairment. 5.   Diet and physical activities 6.   Evidence for depression or mood disorders  The patients weight, height, BMI have been recorded in the chart I have made referrals, counseling and provided education to the patient based review of the above  Problem # 2:  HYPERLIPIDEMIA (ICD-272.4)  Her updated medication list for this problem includes:    Zetia 10 Mg Tabs (Ezetimibe) .Marland Kitchen... Take 1 tablet by mouth once a day pt  declines further tx such as welchol in addition  Labs Reviewed: SGOT: 18 (08/22/2010)   SGPT: 18 (08/22/2010)   HDL:40.10 (08/22/2010), 48.40 (07/08/2009)  LDL:DEL (06/07/2008), DEL (02/21/2007)  Chol:235 (08/22/2010), 281 (07/08/2009)  Trig:198.0 (08/22/2010), 192.0 (07/08/2009)  Problem # 3:  PERIPHERAL EDEMA (ICD-782.3)  Her updated medication list for this problem includes:    Hydrochlorothiazide 12.5 Mg Caps (Hydrochlorothiazide) .Marland Kitchen... 1po once daily treat as above, f/u any worsening signs or symptoms   Problem # 4:  HYPERTENSION (ICD-401.9)  Her updated medication list for this problem includes:    Cozaar 100 Mg Tabs (Losartan potassium) .Marland Kitchen... 1 by mouth once daily    Carvedilol 25 Mg Tabs (Carvedilol) .Marland Kitchen... 2 by mouth  two times  a day    Hydrochlorothiazide 12.5 Mg Caps (Hydrochlorothiazide) .Marland Kitchen... 1po once daily  BP today: 122/70 Prior BP: 130/87 (07/19/2009)  Labs Reviewed: K+: 4.4 (08/22/2010) Creat: : 0.9 (08/22/2010)   Chol: 235 (08/22/2010)   HDL: 40.10 (08/22/2010)   LDL: DEL (06/07/2008)   TG: 198.0 (08/22/2010) stable overall by hx and exam, ok to continue meds/tx as is   Complete Medication List: 1)  Advair Diskus 250-50 Mcg/dose Misc (Fluticasone-salmeterol) .... Inhale 1 puff as directed twice a day 2)  Levothyroxine Sodium 125 Mcg Tabs (Levothyroxine sodium) .... Take 1 tablet by mouth once a  day 3)  Proair Hfa 108 (90 Base) Mcg/act Aers (Albuterol sulfate) .... Inhale 2 puff using inhaler four times a day 4)  Skelaxin 800 Mg Tabs (Metaxalone) .Marland Kitchen.. 1 by mouth two times a day as needed 5)  Zetia 10 Mg Tabs (Ezetimibe) .... Take 1 tablet by mouth once a day 6)  Plavix 75 Mg Tabs (Clopidogrel bisulfate) .... Take 1 tablet by mouth once a day 7)  Nitrostat 0.4 Mg Subl (Nitroglycerin) .... Use asd as needed 8)  Isosorbide Mononitrate Cr 60 Mg Xr24h-tab (Isosorbide mononitrate) .... Take one tablet by mouth daily 9)  Cozaar 100 Mg Tabs (Losartan potassium) .Marland Kitchen.. 1 by  mouth once daily 10)  Carvedilol 25 Mg Tabs (Carvedilol) .... 2 by mouth  two times a day 11)  Omeprazole 20 Mg Cpdr (Omeprazole) .Marland Kitchen.. 1 by mouth two times a day 12)  Aspirin Ec 325 Mg Tbec (Aspirin) .... Take one tablet by mouth daily 13)  Tramadol Hcl 50 Mg Tabs (Tramadol hcl) .Marland Kitchen.. 1 by mouth qid as needed 14)  Motion Sickness 50 Mg Tabs (Dimenhydrinate) .... As needed 15)  Singulair 10 Mg Tabs (Montelukast sodium) .Marland Kitchen.. 1 tab by mouth once daily 16)  Hydrochlorothiazide 12.5 Mg Caps (Hydrochlorothiazide) .Marland Kitchen.. 1po once daily  Other Orders: Admin 1st Vaccine (40347) Flu Vaccine 69yrs + (42595) Pneumococcal Vaccine (63875) Admin of Any Addtl Vaccine (64332)  Patient Instructions: 1)  you had the pneumonia shot today, and the flu shot 2)  Continue all previous medications as before this visit  3)  Please take all new medications as prescribed - the mild fluid pill 4)  All of your medications were sent to the pharmacy 5)  Please schedule a follow-up appointment in 1 year, or sooner if needed Prescriptions: HYDROCHLOROTHIAZIDE 12.5 MG CAPS (HYDROCHLOROTHIAZIDE) 1po once daily  #30 x 11   Entered and Authorized by:   Corwin Levins MD   Signed by:   Corwin Levins MD on 08/28/2010   Method used:   Electronically to        [facility] (retail)       [address]       [place], [place]  [address]       Ph: [phone]       Fax: [phone]   RxID:   [id number] SINGULAIR 10 MG TABS (MONTELUKAST SODIUM) 1 tab by mouth once daily  #30 x 11   Entered and Authorized by:   Corwin Levins MD   Signed by:   Corwin Levins MD on 08/28/2010   Method used:   Electronically to        [facility] (retail)       [address]       [place], [place]  [address]       Ph: [phone]       Fax: [phone]   RxID:   [id number] TRAMADOL HCL 50 MG TABS (TRAMADOL HCL) 1 by mouth qid as needed  #120 x 3   Entered and  Authorized by:   Corwin Levins MD   Signed by:   Corwin Levins MD on 08/28/2010   Method used:   Electronically to        [facility] (retail)       [address]       [place], [place]  [address]       Ph: [phone]       Fax: [phone]   RxID:   [id number] OMEPRAZOLE 20 MG  CPDR (OMEPRAZOLE) 1 by mouth two times a day  #60 x 11   Entered and Authorized by:   Corwin Levins MD   Signed by:   Corwin Levins MD on 08/28/2010   Method used:   Electronically to        [facility].* (retail)       [address]       [place], [place]  [address]       Ph: [phone]       Fax: [phone]   RxID:   [id number] CARVEDILOL 25 MG  TABS (CARVEDILOL) 2 by mouth  two times a day  #120 x 11   Entered and Authorized by:   Corwin Levins MD   Signed by:   Corwin Levins MD on 08/28/2010   Method used:   Electronically to        [facility] (retail)       [address]       [place], [place]  [address]       Ph: [phone]       Fax: [phone]   RxID:   [id number] COZAAR 100 MG TABS (LOSARTAN POTASSIUM) 1 by mouth once daily  #30 x 11   Entered and Authorized by:   Corwin Levins MD   Signed by:   Corwin Levins MD on 08/28/2010   Method used:   Electronically to        [facility] (retail)       [address]       [place], [place]  [address]       Ph: [phone]       Fax: [phone]   RxID:   [id number] ISOSORBIDE MONONITRATE CR 60 MG XR24H-TAB (ISOSORBIDE MONONITRATE) Take one tablet by mouth daily  #30 x 11   Entered and Authorized by:   Corwin Levins MD   Signed by:   Corwin Levins MD on 08/28/2010   Method used:   Electronically to        [facility] (retail)       [address]       [place], [place]  [address]       Ph: [phone]       Fax: [phone]   RxID:    [id number] NITROSTAT 0.4 MG  SUBL (NITROGLYCERIN) use asd as needed  #1 x 11  Entered and Authorized by:   Corwin Levins MD   Signed by:   Corwin Levins MD on 08/28/2010   Method used:   Electronically to        [facility].* (retail)       [address]       [place], [place]  [address]       Ph: [phone]       Fax: [phone]   RxID:   [id number] PLAVIX 75 MG  TABS (CLOPIDOGREL BISULFATE) Take 1 tablet by mouth once a day  #30 x 11   Entered and Authorized by:   Corwin Levins MD   Signed by:   Corwin Levins MD on 08/28/2010   Method used:   Electronically to        [facility] (retail)       [address]       [place], [place]  [address]       Ph: [phone]       Fax: [phone]   RxID:   [id number] ZETIA 10 MG  TABS (EZETIMIBE) Take 1 tablet by mouth once a day  #30 x 11   Entered and Authorized by:   Corwin Levins MD   Signed by:   Corwin Levins MD on 08/28/2010   Method used:   Electronically to        [facility] (retail)       [address]       [place], [place]  [address]       Ph: [phone]       Fax: [phone]   RxID:   [id number] PROAIR HFA 108 (90 BASE) MCG/ACT AERS (ALBUTEROL SULFATE) Inhale 2 puff using inhaler four times a day  #1 x 11   Entered and Authorized by:   Corwin Levins MD   Signed by:   Corwin Levins MD on 08/28/2010   Method used:   Electronically to        [facility] (retail)       [address]       [place], [place]  [address]       Ph: [phone]       Fax: [phone]   RxID:   [id number] LEVOTHYROXINE SODIUM 125 MCG TABS (LEVOTHYROXINE SODIUM) Take 1 tablet by mouth once a  day  #90 x 3   Entered and Authorized by:   Corwin Levins MD   Signed by:   Corwin Levins MD on 08/28/2010   Method used:   Electronically to        [facility]  (retail)       [address]       [place], [place]  [address]       Ph: [phone]       Fax: [phone]   RxID:   [id number] ADVAIR DISKUS 250-50 MCG/DOSE MISC (FLUTICASONE-SALMETEROL) Inhale 1 puff as directed twice a day  #1 x 11   Entered and Authorized by:   Corwin Levins MD   Signed by:   Corwin Levins MD on 08/28/2010   Method used:  Electronically to        [facility] (retail)       [address] [place] [address]       [place], [place]  [address]       Ph: [phone]       Fax: [phone]   RxID:   [id number]    Orders Added: 1)  EKG w/ Interpretation [93000] 2)  Admin 1st Vaccine [90471] 3)  Flu Vaccine 80yrs + [30160] 4)  Pneumococcal Vaccine [907[address]] 5)  Admin of Any Addtl Vaccine [90472] 6)  Est. Patient 40-64 years [99396]   Immunizations Administered:  Pneumonia Vaccine:    Vaccine Type: Pneumovax    Site: right deltoid    Mfr: Merck    Dose: 0.5 ml    Route: IM    Given by: Zella Ball Ewing CMA (AAMA)    Exp. Date: 01/18/2012    Lot #: [id number]    VIS given: 10/10/09 version given August 28, 2010.   Immunizations Administered:  Pneumonia Vaccine:    Vaccine Type: Pneumovax    Site: right deltoid    Mfr: Merck    Dose: 0.5 ml    Route: IM    Given by: Zella Ball Ewing CMA (AAMA)    Exp. Date: 01/18/2012    Lot #: [id number]    VIS given: 10/10/09 version given August 28, 2010. Flu Vaccine Consent Questions     Do you have a history of severe allergic reactions to this vaccine? no    Any prior history of allergic reactions to egg and/or gelatin? no    Do you have a sensitivity to the preservative Thimersol? no    Do you have a past history of Guillan-Barre Syndrome? no    Do you currently have an acute febrile illness? no    Have you ever had a severe reaction to latex? no    Vaccine information given and explained to patient? yes    Are you currently pregnant? no    Lot Number:[id number]    Exp Date:05/05/2011   Site Given  Left Deltoid [id number]

## 2010-12-07 NOTE — Progress Notes (Signed)
  Phone Note Refill Request Message from:  Fax from Pharmacy on July 18, 2010 2:40 PM  Refills Requested: Medication #1:  CARVEDILOL 25 MG  TABS 2 by mouth  two times a day   Dosage confirmed as above?Dosage Confirmed   Last Refilled: 07/2009   Notes: [facility] [address] [place] Initial call taken by: Robin Ewing CMA Duncan Dull),  July 18, 2010 2:40 PM    Prescriptions: CARVEDILOL 25 MG  TABS (CARVEDILOL) 2 by mouth  two times a day  #120 x 0   Entered by:   Scharlene Gloss CMA (AAMA)   Authorized by:   Corwin Levins MD   Signed by:   Scharlene Gloss CMA (AAMA) on 07/18/2010   Method used:   Faxed to ...       [facility] [address]* (retail)       [address]       [place], [place]  [address]       Ph: [phone]       Fax: [phone]   RxID:   [id number]

## 2011-02-14 LAB — BASIC METABOLIC PANEL
BUN: 5 mg/dL — ABNORMAL LOW (ref 6–23)
BUN: 9 mg/dL (ref 6–23)
CO2: 26 mEq/L (ref 19–32)
Chloride: 107 mEq/L (ref 96–112)
Chloride: 111 mEq/L (ref 96–112)
Creatinine, Ser: 1.38 mg/dL — ABNORMAL HIGH (ref 0.4–1.2)
Potassium: 3.3 mEq/L — ABNORMAL LOW (ref 3.5–5.1)
Sodium: 144 mEq/L (ref 135–145)

## 2011-02-14 LAB — CULTURE, BLOOD (ROUTINE X 2)
Culture: NO GROWTH
Culture: NO GROWTH

## 2011-02-14 LAB — BRAIN NATRIURETIC PEPTIDE: Pro B Natriuretic peptide (BNP): 92 pg/mL (ref 0.0–100.0)

## 2011-02-14 LAB — COMPREHENSIVE METABOLIC PANEL
AST: 12 U/L (ref 0–37)
Alkaline Phosphatase: 75 U/L (ref 39–117)
BUN: 7 mg/dL (ref 6–23)
CO2: 27 mEq/L (ref 19–32)
Chloride: 104 mEq/L (ref 96–112)
Creatinine, Ser: 0.95 mg/dL (ref 0.4–1.2)
GFR calc Af Amer: >60 mL/min (ref >60)
GFR calc non Af Amer: >60 mL/min (ref >60)
Potassium: 3.5 mEq/L (ref 3.5–5.1)
Total Bilirubin: 0.4 mg/dL (ref 0.3–1.2)

## 2011-02-14 LAB — POCT CARDIAC MARKERS

## 2011-02-14 LAB — URINE CULTURE: Colony Count: NO GROWTH

## 2011-02-14 LAB — D-DIMER, QUANTITATIVE: D-Dimer, Quant: 0.37 ug/mL-FEU (ref 0.00–0.48)

## 2011-02-14 LAB — DIFFERENTIAL
Basophils Absolute: 0.1 10*3/uL (ref 0.0–0.1)
Basophils Relative: 1 % (ref 0–1)
Eosinophils Absolute: 0.2 10*3/uL (ref 0.0–0.7)
Neutrophils Relative %: 66 % (ref 43–77)

## 2011-02-14 LAB — CBC
HCT: 33.6 % — ABNORMAL LOW (ref 36.0–46.0)
Hemoglobin: 11.3 g/dL — ABNORMAL LOW (ref 12.0–15.0)
MCHC: 33.2 g/dL (ref 30.0–36.0)
MCV: 84.6 fL (ref 78.0–100.0)
MCV: 84.9 fL (ref 78.0–100.0)
Platelets: 277 10*3/uL (ref 150–400)
Platelets: 316 10*3/uL (ref 150–400)
RDW: 15.8 % — ABNORMAL HIGH (ref 11.5–15.5)
WBC: 10.1 10*3/uL (ref 4.0–10.5)
WBC: 7.5 10*3/uL (ref 4.0–10.5)

## 2011-02-14 LAB — APTT: aPTT: 30 seconds (ref 24–37)

## 2011-02-14 LAB — URINALYSIS, ROUTINE W REFLEX MICROSCOPIC
Bilirubin Urine: NEGATIVE
Hgb urine dipstick: NEGATIVE
Specific Gravity, Urine: 1.015 (ref 1.005–1.030)
pH: 6.5 (ref 5.0–8.0)

## 2011-02-14 LAB — CARDIAC PANEL(CRET KIN+CKTOT+MB+TROPI)
Relative Index: INVALID (ref 0.0–2.5)
Total CK: 48 U/L (ref 7–177)
Total CK: 68 U/L (ref 7–177)
Troponin I: <0.01 ng/mL (ref 0.00–0.06)

## 2011-02-14 LAB — SODIUM, URINE, RANDOM: Sodium, Ur: 169 mEq/L

## 2011-02-14 LAB — LIPID PANEL
Cholesterol: 179 mg/dL (ref 0–200)
LDL Cholesterol: 107 mg/dL — ABNORMAL HIGH (ref 0–99)
VLDL: 34 mg/dL (ref 0–40)

## 2011-02-19 LAB — CROSSMATCH: ABO/RH(D): O POS

## 2011-02-19 LAB — DIFFERENTIAL
Basophils Absolute: 0 10*3/uL (ref 0.0–0.1)
Eosinophils Absolute: 0.2 10*3/uL (ref 0.0–0.7)
Eosinophils Relative: 3 % (ref 0–5)
Monocytes Absolute: 0.7 10*3/uL (ref 0.1–1.0)

## 2011-02-19 LAB — CBC
HCT: 20.9 % — ABNORMAL LOW (ref 36.0–46.0)
HCT: 31.4 % — ABNORMAL LOW (ref 36.0–46.0)
Hemoglobin: 10.2 g/dL — ABNORMAL LOW (ref 12.0–15.0)
Hemoglobin: 6.8 g/dL — CL (ref 12.0–15.0)
Hemoglobin: 9.4 g/dL — ABNORMAL LOW (ref 12.0–15.0)
MCHC: 32.2 g/dL (ref 30.0–36.0)
MCV: 83.8 fL (ref 78.0–100.0)
MCV: 84.3 fL (ref 78.0–100.0)
MCV: 86.1 fL (ref 78.0–100.0)
Platelets: 461 10*3/uL — ABNORMAL HIGH (ref 150–400)
RBC: 3.48 MIL/uL — ABNORMAL LOW (ref 3.87–5.11)
RBC: 3.64 MIL/uL — ABNORMAL LOW (ref 3.87–5.11)
RDW: 15.6 % — ABNORMAL HIGH (ref 11.5–15.5)
RDW: 16.1 % — ABNORMAL HIGH (ref 11.5–15.5)
WBC: 15.1 10*3/uL — ABNORMAL HIGH (ref 4.0–10.5)

## 2011-02-19 LAB — BASIC METABOLIC PANEL
Chloride: 106 mEq/L (ref 96–112)
GFR calc Af Amer: >60 mL/min (ref >60)
GFR calc non Af Amer: 60 mL/min — ABNORMAL LOW (ref >60)
Potassium: 3.7 mEq/L (ref 3.5–5.1)
Sodium: 138 mEq/L (ref 135–145)

## 2011-03-20 NOTE — Cardiovascular Report (Signed)
NAMENOHEA, KRAS NO.:  [id number]   MEDICAL RECORD NO.:  [id number]          PATIENT TYPE:  INP   LOCATION:  2025                         FACILITY:  [facility]   PHYSICIAN:  Veverly Fells. Excell Seltzer, MD  DATE OF BIRTH:  Dec 16, 1950   DATE OF PROCEDURE:  06/18/2007  DATE OF DISCHARGE:                            CARDIAC CATHETERIZATION   PROCEDURE:  Left heart catheterization, selective coronary angiography,  left ventricular angiography, StarClose to the right femoral artery.   INDICATIONS:  Ms. Delcastillo is a 60 year old woman with an inferior MI  approximately one year ago.  She had a total occlusion of the right  coronary artery was treated with overlapping drug-eluting stents.  She  has had some ongoing chest pain but presented with crescendo angina.  Her cardiac enzymes are positive.  She was referred for catheterization  in the setting of her acute coronary syndrome.   Risks and indications were explained to the patient.  Informed consent  was obtained.  The right groin was prepped, draped, and anesthetized  with 1% lidocaine.  Using a modified Seldinger technique, a 6-French  sheath was placed in the right femoral artery.  Multiple views of the  right and left coronary arteries were taken using standard 6-French  preformed Judkins catheters.  Following selective coronary angiography,  an angled pigtail catheter was inserted into the left ventricle where  pressures were recorded.  A left ventriculogram was performed.  Pullback  across the aortic valve was done.  At the completion of the procedure, a  StarClose device was used to seal the femoral arteriotomy.  All catheter  exchanges were performed over a guidewire.  There were no immediate  complications.   FINDINGS:  Aortic pressure 159/81 with mean of 116.  Left ventricular  pressure is 158/10.   CORONARY ANGIOGRAPHY:  The left mainstem is angiographically normal.  It  bifurcates into the LAD and left  circumflex.   The LAD has mild diffuse disease.  There is 30% stenosis in the proximal  LAD as well as 30% stenosis after the first septal perforator.  The  midportion of the vessel has a few nonobstructive stenoses of 10% to  20%.  Just after the first diagonal, a small second diagonal is medium  sized and has mild ostial stenosis of 30% to 40%.   The left circumflex has ostial stenosis of approximately 40%.  It  supplies the intermediate branch that has mild proximal stenosis.  The  intermediate branch is small.  There is a large first OM branch that has  mild nonobstructive plaque.  The AV groove circumflex courses down and  supplies a small posterolateral branch.   The right coronary artery is dominant.  It is a large-caliber vessel.  The ostium is calcified.  There are overlapping stents in the mid and  distal portion of the right coronary artery.  The stented segment is  widely patent.  There is mild diffuse in-stent restenosis of no greater  than 10% to 20%.  Beyond the stents in the posterior AV segment there is  a 30% to 40% stenosis going  into a posterolateral branch.  PDA is  relatively small.   Left ventricular function assessed by ventriculography demonstrates  inferior hypokinesis of the basal level of the inferior wall.  Other  segments are hyperdynamic.  The overall LVEF is preserved at 60%.  There  is no mitral regurgitation.   ASSESSMENT:  1. Nonobstructive LAD (left anterior descending artery) stenosis.  2. Nonobstructive left circumflex stenosis.  3. Widely patent stents in the right coronary artery.  4. Mild segmental left ventricular dysfunction with preserved LVEF      (left ventricular ejection fraction).   PLAN:  We will continue medical therapy.  We need to review with the  patient's orthopedist whether she can remain on aspirin for her upcoming  knee surgery.  If she really needs knee surgery and it cannot be  performed with aspirin, then we can stop  her antiplatelet therapy now  that she is 1 year out from her myocardial infarction.      Veverly Fells. Excell Seltzer, MD  Electronically Signed     MDC/MEDQ  D:  06/18/2007  T:  06/19/2007  Job:  [id number]

## 2011-03-20 NOTE — Assessment & Plan Note (Signed)
[facility]                            CARDIOLOGY OFFICE NOTE   NAME:[name], [name]                      MRN:          [id number]  DATE:04/11/2007                            DOB:          11-21-1950    PRIMARY CARE PHYSICIAN:  Dr. Oliver Barre.   HISTORY OF PRESENT ILLNESS:  Ms. [name] is a 60 year old woman who  suffered an inferior myocardial infarction in August 2007. I treated her  with two Taxus drug eluting stents to the right coronary artery. Ever  since then, she has had continued chest discomfort. We did a repeat  cardiac catheterization in December 2007 which demonstrated patency of  the RCA stent and the only significant residual stenosis was in a small  diagonal. It has never been clear to me whether her chest discomfort has  anything to do with myocardial ischemia, however in the absence of a  clear alternative diagnosis, I have been treating it as such. She does  seem to have responded pretty well to Ranexa, but did have worsened  headaches. She is now off of that. She does have occasional chest  discomforts, but says that she is generally pretty comfortable. She is  mainly troubled by bilateral leg pain. She is contemplating a right  total knee replacement to compliment her prior on the left.   CURRENT MEDICATIONS:  1. Aspirin 325 mg daily.  2. Cozaar 100 mg daily.  3. Zetia 10 mg daily.  4. Prilosec OTC.  5. Claritin-D.  6. Tylenol Arthritis 2 every 8 hours.  7. Coreg 18.75 mg twice daily.  8. Skelaxin 800 mg p.r.n.  9. Levothyroxine 125 mcg daily.  10.Advair inhaler.  11.Plavix 75 mg daily.   PHYSICAL EXAMINATION:  GENERAL:  She is obese, otherwise well appearing  in no distress.  VITAL SIGNS:  Heart rate 100, blood pressure 138/72, weight 201 pounds.  Weight is stable since March.  NECK:  She has no jugular venous distension, thyromegaly, or  lymphadenopathy.  LUNGS:  Clear to auscultation.  CARDIAC:  She has a  nondisplaced point of maximal cardiac impulse. There  is a regular rate and rhythm without murmurs, rubs, or gallops.  ABDOMEN:  Obese, soft, nondistended, nontender. No hepatosplenomegaly.  Bowel sounds are normal. I am unable to assess for pulsatile mass due to  habitus.  EXTREMITIES:  Warm without cyanosis, clubbing, edema, or ulceration. DP  and PT pulses are 2+ bilaterally. Carotid pulses are 2+ bilaterally  without bruit.   IMPRESSION/RECOMMENDATIONS:  1. Coronary disease with prior inferior myocardial infarction:      Continue Plavix and beta blocker. EF is normal. Chest pain is      pretty well control, so we will make no changes in her medications.  2. Somatization disorder: The patient had during her hospitalization      with myocardial infarction, she had facial twitching. Neurology      felt that this was due to somatization disorder, as she had been      hospitalized with a similar thing previously.  3. Fibromyalgia.  4. Hypertension.  5. Hypercholesterolemia: She has  not tolerated multiple statins.   DISPOSITION:  We will have her follow up with Dr. Eden Emms who did her  most recent heart catheterization.     Salvadore Farber, MD  Electronically Signed    WED/MedQ  DD: 04/11/2007  DT: 04/12/2007  Job #: [id number]   cc:   Corwin Levins, MD

## 2011-03-20 NOTE — Assessment & Plan Note (Signed)
[facility]                            CARDIOLOGY OFFICE NOTE   NAME:[name], [name] FEDERER                      MRN:          [id number]  DATE:06/24/2008                            DOB:          Feb 06, 1951    [name] returns today for followup.  She had successful right knee surgery  in October by Dr. Priscille Kluver.   She has now had both knees replaced.  She has significant chest pain  syndromes.  She has significant fibromyalgia as well.   We performed a heart cath on her June 18, 2007, where she had a widely  patent stent to the right coronary artery with a normal EF.   A lot of her chest pain is noncardiac.  I think part of it is related to  the fact that she ambulates with 2 canes.   Her previous inferior wall MI was in August 2007, and talking to the  patient, I really do think that her chest pain is atypical, and it is  not likely to be angina.  She will have a followup Myoview in a year.   Her review of systems is remarkable for significant shortness of breath  that sounds like some of her obstructive sleep symptoms and COPD.  She  also has been having fevers at night.  Apparently, she has talked to Dr.  Jonny Ruiz about this.  She does have a history of diverticulitis and seems to  have some left lower quadrant pain.  I have told her to follow up with  him.  Previous blood cultures have been drawn.  She probably needs an  abdominal CT scan to rule out an occult abscess.   She has had some nausea with her current dose of carvedilol, and I have  suggested that she split this up.  Her blood pressure has been little  bit labile.  Dr. Jonny Ruiz had recently increased her carvedilol.  I also  told her that increasing her Cozaar from 50 to 100 would be reasonable.   Her current medications include,  1. Plavix 75 a day.  2. Omeprazole 20 b.i.d.  3. Skelaxin 800 b.i.d.  4. Cozaar 50 a day.  5. Zetia 10 a daily.  6. Levothyroxine 125 a day.  7. Singulair 10 a  day.  8. Advair 250/50 b.i.d.  9. Aspirin.  10.Isosorbide 60 a day.  11.Carvedilol 50 b.i.d.  12.Acetaminophen and meclizine.   PHYSICAL EXAMINATION:  GENERAL:  Remarkable for chronic ill-appearing,  pale white female, in no distress.  VITAL SIGNS:  Weight is 201, blood pressure is 150/88, pulse 76 and  regular, respiratory rate 14, afebrile.  HEENT:  Unremarkable.  Carotids are normal without bruit, no  lymphadenopathy, no thyromegaly, no JVP elevation.  LUNGS:  Clear.  Good diaphragmatic motion.  No wheezing.  S1 and S2.  Normal heart sounds.  PMI not palpable.  ABDOMEN:  Protuberant.  No significant left lower quadrant pain.  No  bruit, no tenderness, no hepatosplenomegaly, no hepatojugular reflux.  EXTREMITIES:  Distal pulses are intact.  Trace edema.  Bilateral knee  replacements.  NEURO:  Nonfocal.  SKIN:  Warm and dry.  MUSCULOSKELETAL:  No muscular weakness.   IMPRESSION:  1. Coronary disease, widely patent stent to the right coronary artery      on catheterization August 2008.  Followup Myoview in a year.      Continued chest pain syndrome, likely secondary to fibromyalgia.  2. Hypertension.  She has just increased her carvedilol dose.  She      will follow up with Dr. Jonny Ruiz.  Continue low-salt diet.  Increase      Cozaar to 100 a day.  3. Hypothyroidism.  Continue levothyroxine 125 mcg a day.  TSH in 6      months.  4. Chronic obstructive pulmonary disease.  Continue Singulair and      Advair.  Follow up primary care.  5. Status post bilateral knee replacement.  She was a bit anemic after      her surgery.  She should probably be on iron therapy.  I think this      may be related to some of her shortness of breath.  Followup      hemoglobin and hematocrit per Dr. Jonny Ruiz.  6. Anxiety, depression with fibromyalgia.  Continue Skelaxin 800      b.i.d.   Overall, I think [name] is doing well.  We spent quite a bit of time  today trying to tease out her chest pain syndromes,  but I really do  think her cardiac status is stable.  She had no complications with her  knee surgery.  I will see her back in 6 months.     Noralyn Pick. Eden Emms, MD, [facility]  Electronically Signed    PCN/MedQ  DD: 06/24/2008  DT: 06/25/2008  Job #: [id number]

## 2011-03-20 NOTE — Assessment & Plan Note (Signed)
[facility]                       [place] CARDIOLOGY OFFICE NOTE   NAME:[name], [name]                      MRN:          [id number]  DATE:07/16/2007                            DOB:          06-19-1951    The patient returns today for follow-up.  I have been trying to clear  her for knee surgery with Dr. Priscille Kluver in October.   Patient has known coronary artery disease.  She has had previous stents  to the right coronary artery.  She also has significant fibromyalgia and  multiple pain syndromes.   The patient had recent heart catheterization with nonobstructive  disease.  She had patent stents in the right coronary artery with an EF  of 60%.  She did have a mild bump in her enzymes and there was a  question of vasospasm.  We started her on nitrates.   I had a long discussion with the patient.  I could not explain the lack  of a culprit lesion and the mild bump in enzymes.  Her clinical syndrome  is consistent with fibromyalgia.   She is on Skelaxin for this.   Since her stents are widely patent and she has normal LV function she  would be considered low risk for knee surgery.  As I recall, she has non-  drug eluting stents that were placed some time ago.   She can stop her Plavix 5-7 days prior to knee surgery.   Risk factors being well modified.  She is on Zetia for  hypercholesterolemia.  She has been intolerant to Statins in the past.   REVIEW OF SYSTEMS:  Primarily remarkable for right knee pain and  otherwise negative.   CURRENT MEDICATIONS:  1. Imdur 30 mg a day which was added after a recent hospital discharge      for a question of spasm.  2. Aspirin.  3. Plavix.  4. Omeprazole 20 b.i.d.  5. Skelaxin 800 b.i.d.  6. Cozaar 50 a day.  7. Zetia 10 a day.  8. Carvedilol three 12.5 mg tablets b.i.d.  9. Synthroid 125 mcg a day.  10.Singulair 10 a day.   PHYSICAL EXAMINATION:  VITAL SIGNS:  Blood pressure 140/80, pulse 80 and  regular, afebrile, respiratory rate 14, weight 202.  GENERAL APPEARANCE:  An obese white female in no distress.  She has a  cane which she walks with.  HEENT:  Normal.  NECK:  Carotids normal without bruit.  No lymphadenopathy, no  thyromegaly.  No JVP elevation.  LUNGS:  Clear with good diaphragmatic motion.  No wheezing.  CARDIOVASCULAR:  S1 and S2 with normal heart sounds.  PMI is normal.  ABDOMEN:  Benign.  Bowel sounds positive.  No AAA, no bruits, no  hepatosplenomegaly or hepatojugular reflux.  No tenderness.  EXTREMITIES:  Cath site is well healed.  No bruits.  Femorals are +3 and  deep and medially placed.  PTs are +2.  No lower extremity edema.  NEUROLOGIC:  Nonfocal.  No muscular weakness.  SKIN:  Warm and dry.   Baseline EKG is totally normal.   IMPRESSION:  1. Coronary disease,  previous stents to the right coronary artery      patent by recent catheterization.  Multiple chest pain syndrome      most likely related to fibromyalgia, lower likelihood of spasms.      She will continue her beta-blocker, Imdur and aspirin up until the      time of surgery.  She will hold her Plavix 5-7 days.  I have asked      that we be consulted to follow her in the hospital.  2. Hypercholesterolemia.  Intolerant to Statins.  Continue Zetia.      Follow up lipid and liver profile in six months.  3. Hypertension.  Currently well controlled on a combination of Imdur      and carvedilol.  4. Seasonal allergies.  Continue Singulair 10 mg a day.  Supplement      with Claritin as needed.  5. Fibromyalgia.  Continue Skelaxin 800 b.i.d.  May need further      referral for this as I think a lot of her chest pain syndrome      involves this.  6. History of reflux secondary to obesity and __________ .  Continue      omeprazole 20 b.i.d.   Overall, I think the patient is low risk for surgery and we will follow  her in the hospital.     Theron Arista C. Eden Emms, MD, [facility]  Electronically Signed     PCN/MedQ  DD: 07/16/2007  DT: 07/17/2007  Job #: [id number]

## 2011-03-20 NOTE — Discharge Summary (Signed)
[name], [name]               ACCOUNT NO.:  [id number]   MEDICAL RECORD NO.:  [id number]          PATIENT TYPE:  INP   LOCATION:  2025                         FACILITY:  [facility]   PHYSICIAN:  Noralyn Pick. Eden Emms, MD, FACCDATE OF BIRTH:  May 22, 1951   DATE OF ADMISSION:  06/18/2007  DATE OF DISCHARGE:  06/20/2007                               DISCHARGE SUMMARY   PRIMARY CARDIOLOGIST:  Theron Arista C. Eden Emms, MD, [facility]   PRIMARY CARE PHYSICIAN:  Dr. Oliver Barre.   PRIMARY ORTHOPEDIC SURGEON:  John L. Rendall, M.D.   PROCEDURES PERFORMED DURING HOSPITALIZATION:  Cardiac catheterization  dated June 18, 2007 per Dr. Tonny Bollman revealing nonobstructive  left anterior descending stenosis, nonobstructive left circumflex  stenosis, widely patent stent to the right coronary artery, mild  segmental left ventricular dysfunction with preserved left ventricular  ejection fraction with continued medical therapy.  The patient should  remain on aspirin and Plavix.  The patient may be experiencing  vasospasms.   PRIMARY DISCHARGE DIAGNOSES:  1. Chest pain.      a.     Known history of coronary artery disease with inferior       myocardial infarction August 2007, with continued chronic chest       discomfort.      b.     Status post stents to the right coronary artery.      c.     Last adenosine Myoview revealing no ischemia or scar, dated       April 20, 2007.  2. History of somatization disorder.  3. Anxiety disorder.  4. Fibromyalgia.  5. Hypertension.  6. Hyperlipidemia (intolerant to statin, causing inflammatory bowel      disease and frequent diarrhea).   SECONDARY DIAGNOSES:  1. Hypothyroidism.  2. Asthma.  3. History of hiatal hernia.  4. Irritable bowel syndrome.  5. Diverticulitis.  6. Status post cholecystectomy.   HOSPITAL COURSE:  This is a 60 year old Caucasian female who presents to  [facility] Emergency Room via private vehicle secondary to continued  chest discomfort.  The  patient had recently been seen by Dr. Priscille Kluver,  orthopedic surgeon, for planned surgery on her right knee and had  aspirin and Plavix stopped approximately 3 days prior to admission as  requested of Dr. Priscille Kluver for the surgery.  She did see Dr. Eden Emms in the  office for preoperative evaluation and was cleared.  Around 7 p.m. on  the night of  June 17, 2007 the patient became very hot, did not feel  well.  She began to have left anterior chest indigestion with the  feeling of sharp pains in her left axilla going down her left arm,  associated with shortness of breath and nausea.  She was unable to find  relief with rest, but she was able to fall asleep.  She woke up 11 p.m.  that night with continued chest discomfort intermittently.  The patient  came to the emergency room.  Her blood pressure was found to be elevated  at 190/100.  The patient took 3 sublingual nitroglycerin around 4 a.m.  prior to coming.  She stated that the symptoms were similar to a  myocardial infarction, and her husband brought her to the emergency  room.   The patient was seen and examined, and cardiac catheterization was  completed by Dr. Tonny Bollman secondary to ongoing chest discomfort.  Cardiac catheterization revealed basal mid-inferior hypokinesis.  Otherwise, normal left ventricular function of 60%, nonobstructive left  anterior descending, normal circumflex, widely patent right coronary  artery stents, mild segmental left ventricular dysfunction with  preserved left ventricular ejection fraction, questionable vasospasms.   The patient the following day was seen and examined by Dr. Theron Arista C.  Nishan.  She had ongoing chest discomfort radiating into her jaw and  both arms.  The patient was reassured by Dr. Eden Emms that the pain was  probably not related to coronary artery disease as her cardiac  catheterization revealed good results.  The patient has been advised to  stop her Claritin D at home.  We have  decreased her Cozaar to 50 mg once  a day and have added Imdur 30 mg once a day to assist with vasospasms  and chronic chest discomfort.  It has also been advised that the patient  postpone right knee surgery for approximately 4 weeks to allow her to  begin to feel better.  Lengthy discussion with the patient was had by  Dr. Eden Emms concerning the patient's symptoms, prognosis and need for  followup.  She verbalized understanding and was willing to return home.   VITAL SIGNS ON DISCHARGE/LABORATORY DATA:  105/63, heart rate 72,  respirations 20, temperature 96.8, O2 sat 95% on room air.  Sodium 140,  potassium 3.6, chloride 104, CO2 of 30, BUN 10, creatinine 0.85, glucose  115, BNP less than 30.  Cardiac enzymes:  The patient's troponin 0.50,  1.20 and 0.82, with a troponin this morning of 0.36.  CK:  130, 168, 136  and 84.  MB:  9.8, 12.0, 8.0 and 2.3, respectively.  Cholesterol 193,  triglycerides 143, HDL 38, LDL 126, hemoglobin A1c 6.3.  EKG revealing  normal sinus rhythm.   DISCHARGE MEDICATIONS:  1. Aspirin 325 mg once a day.  2. Cozaar 50 mg daily (new, lower dose).  3. Zetia 10 mg daily.  4. Prilosec over-the-counter daily.  5. Coreg 37.5 twice a day.  6. Skelaxin 800 mg p.r.n.  7. Levothyroxine 125 mcg daily.  8. Advair inhaler 50/250 twice a day.  9. Plavix 75 mg daily.  10.Tylenol Arthritis 2 tablets every 8 hours as needed.  11.Imdur 30 mg one p.o. daily. (new prescription).  12.Sublingual nitroglycerin p.r.n. pain.   The patient has been advised to stop taking Claritin D.   DISCHARGE PLANS/APPOINTMENTS:  1. The patient will see Dr. Eden Emms in approximately 2 weeks with a      followup CPK, MB and troponin.  The office will call as it is      closed at the time of this dictation.  2. The patient will follow up with Dr. Priscille Kluver to discuss planned      surgery of the right knee after approximately 4 weeks.  The patient      is to call on her own accord.  3. The patient  is to follow up with Dr. Corwin Levins for continued      medical management.  4. The patient has been given post-cardiac catheterization      instructions with particular emphasis on the right groin site for      evidence of hematoma,  bleeding, infection or severe pain.  5. The patient has been advised on new medications and decrease in      Cozaar to 50 mg daily and to stop taking Claritin.   Time spent with the patient to include physician time 50 minutes.   DICTATED BY:  Joni Reining, NP, for Noralyn Pick. Eden Emms, MD, [facility]      Noralyn Pick. Eden Emms, MD, [facility]  Electronically Signed     PCN/MEDQ  D:  06/20/2007  T:  06/20/2007  Job:  [id number]   cc:   Corwin Levins, MD  Carlisle Beers. Rendall, M.D.

## 2011-03-20 NOTE — Op Note (Signed)
[name], [name] NO.:  [id number]   MEDICAL RECORD NO.:  [id number]          PATIENT TYPE:  INP   LOCATION:  1313                         FACILITY:  [facility]   PHYSICIAN:  Adolph Pollack, M.D.DATE OF BIRTH:  November 20, 1950   DATE OF PROCEDURE:  12/01/2008  DATE OF DISCHARGE:                               OPERATIVE REPORT   PREOPERATIVE DIAGNOSIS:  Deep abdominal wound infection.   POSTOPERATIVE DIAGNOSIS:  Deep abdominal wound infection.   PROCEDURE:  Wound exploration and drainage of deep abdominal wound  infection.   SURGEON:  Adolph Pollack, M.D.   ANESTHESIA:  General.   INDICATION:  [name] is a 60 year old female who underwent an  elective laparoscopic partial colectomy on November 04, 2008.  She had  an unremarkable postoperative course and was doing well until earlier  this week when she came to the office on November 29, 2008 with an  obvious wound infection that was drained in the office.  This was  complicated by some bleeding later that night requiring readmission to  the hospital.  We have been doing dressing changes.  However, the wound  infection needs to be completely evacuated.  She now is brought to the  operating room for wound exploration and evacuation.   TECHNIQUE:  She was brought to the operating room and placed supine on  the operating table.  General anesthetic was administered.  Her  abdominal wall was sterilely prepped and draped.  Using electrocautery,  I opened up the full length of the lower midline extraction site wound  and evacuated some purulent material.  There was no necrotic tissue deep  to this.  The fascia was intact.   I subsequently controlled bleeding with electrocautery.  I then packed  the wound with saline-moistened Kerlix, followed by a dry dressing.  She  tolerated procedure without any apparent complications and was taken to  recovery in satisfactory condition.      Adolph Pollack, M.D.  Electronically Signed     TJR/MEDQ  D:  12/01/2008  T:  12/01/2008  Job:  [id number]

## 2011-03-20 NOTE — Assessment & Plan Note (Signed)
[name]                            CARDIOLOGY OFFICE NOTE   NAME:[name], [name]                      MRN:          [id number]  DATE:07/09/2007                            DOB:          May 21, 1951    CARDIOLOGIST:  Noralyn Pick. Eden Emms, M.D.   PRIMARY CARE PHYSICIAN:  Corwin Levins, M.D.   HISTORY OF PRESENT ILLNESS:  [name] is a 60 year old female  patient with a history of coronary artery disease, status post inferior  wall myocardial infarction, August of 2007, treated with overlapping  drug-eluting stents at the RCA, who has had chronic chest pain since her  initial infarct.  She has undergone relook catheterization in February  2008.  She actually saw Dr. Eden Emms for initial evaluation June 17, 2007.  She was cleared for upcoming knee surgery at that time.  However,  the next day she presented to [facility] with complaints of  chest pain.  She actually ruled in for myocardial infarction by enzymes.  Her peak CK-MB was 12, peak troponin was 1.2.  Her cardiac  catheterization revealed nonobstructive disease in LAD and circumflex  and patent stents in the RCA.  Her EF was normal at 60%.  There was  concern raised for vasospasm.  She was placed on Imdur.  She was  discharged to home.  She had a follow-up CK-MB performed August 27,  which was back to normal at 1.5.   She returns to the office today for follow-up.  She continues to note  chronic chest pain.  As noted above, this has been ongoing since her  initial infarct in August of 2007.  She really has several different  kinds of chest pain.  She describes a burning sensation.  She also  describes a pressure.  This can come on with exertion.  It can happen at  rest.  She denies any syncope but has been light-headed at times.  She  denies any PND but does sleep on two pillows.  She denies any  significant pedal edema.   Of note last night she had ongoing chest discomfort with  radiation to  her left arm.  She also had some shortness of breath and nausea.  She  kept taking nitroglycerin without any relief.  Today she notes that she  is doing better without chest discomfort but continues to have some  nausea.  She thought about going to the emergency room but decided not  to go.   CURRENT MEDICATIONS:  1. Imdur 30 mg a day.  2. Plavix 75 mg daily.  3. Zetia 10 mg daily.  4. Claritin D.  5. Tylenol Arthritis.  6. Levothyroxine 125 mcg daily.  7. Advair 250/50.  8. Singulair.  9. Prilosec.  10.Skelaxin.  11.Cozaar 50 mg daily.  12.Coreg 37.5 mg b.i.d.  13.Aspirin 325 mg daily.   ALLERGIES:  1. SULFA.  2. CRESTOR.  3. LIPITOR.  4. ZOCOR.  5. PRAVACHOL.  6. LOVASTATIN.   SOCIAL HISTORY:  Denies tobacco or alcohol abuse.   FAMILY HISTORY:  Significant for coronary artery disease.  REVIEW OF SYSTEMS:  Please see HPI.  Denies any fevers, chills, cough,  melena, hematochezia, hematuria or dysuria.  The rest of the review of  systems are negative.   PHYSICAL EXAMINATION:  She is a well-nourished, well-developed female in  no distress.  Blood pressure is 108/70, pulse 73, weight 206  pounds.  HEENT:  Normal.  NECK:  Without JVD.  CARDIOVASCULAR:  Normal S1/S2.  Regular rate and rhythm.  LUNGS:  Clear to auscultation bilaterally without wheezing, rhonchi, or  rales.  ABDOMEN:  Soft, nontender with normal active bowel sounds.  No  organomegaly.  EXTREMITIES:  Without edema.  Calves are soft, nontender.  SKIN:  Warm and dry.  NEUROLOGIC:  She is alert and oriented x3.  Cranial nerves II-XII are  grossly intact.  Right femoral arteriotomy site without hematoma or  bruit.   Electrocardiogram reveals sinus rhythm with heart rate of 73.  Normal  axis, no acute changes.   IMPRESSION:  1. Chronic chest pain.  2. Coronary artery disease.      a.     Status post inferior wall myocardial infarction, August       2007, treated with overlapping  drug-eluting stents to the RCA.      b.     Status post recent non-ST elevation myocardial infarction -       question secondary to vasospasm.      c.     Recent cardiac catheterization with nonobstructive disease       in the LAD and circumflex and patent stents in the RCA  3. Preserved left ventricular function.  4. Anxiety disorder.  5. Somatization disorder.  6. Fibromyalgia.  7. Hypertension.  8. Hyperlipidemia.      a.     Intolerant to Statins.  9. Hypothyroidism, treated.  10.Asthma/chronic obstructive pulmonary disease.  11.Irritable bowel syndrome.  12.Osteoarthritis.   PLAN:  The patient presents to the office today for follow-up.  As noted  above she has chronic chest pain.  It is very difficult to make sense of  her symptoms of chest pain especially in light of her other concomitant  diagnoses.  She has had caths in the past with nonobstructive disease  and patent stents.  However, she did present recently with chest pain  and positive enzymes.  However, again she had nonobstructive disease and  patent stents.  I discussed her case further with Dr. Gala Romney.  At  this point in time, we plan to:  1. Increase her Imdur to 60 mg a day.  2. Continue medications as listed above.  3. Follow up with Dr. Eden Emms in the next 1-2 weeks.  Will defer on      clearing her for surgery until she sees Dr. Eden Emms back in follow-      up.  4. She knows to go to the emergency room if she has any worsening      symptoms or symptoms more reminiscent of her prior myocardial      infarctions.      Tereso Newcomer, PA-C  Electronically Signed      Bevelyn Buckles. Bensimhon, MD  Electronically Signed   SW/MedQ  DD: 07/09/2007  DT: 07/10/2007  Job #: [id number]   cc:   Corwin Levins, MD  Noralyn Pick. Eden Emms, MD, [facility]

## 2011-03-20 NOTE — H&P (Signed)
[name], SAVOIA NO.:  [id number]   MEDICAL RECORD NO.:  [id number]          PATIENT TYPE:  INP   LOCATION:  1823                         FACILITY:  [facility]   PHYSICIAN:  Michiel Cowboy, MDDATE OF BIRTH:  [birth year]   DATE OF ADMISSION:  02/15/2009  DATE OF DISCHARGE:                              HISTORY & PHYSICAL   Corwin Levins, M.D.   CHIEF COMPLAINT:  Chest pain and low grade fevers and chills.   HISTORY OF PRESENT ILLNESS:  The patient is a 60 year old female with  past medical history significant for coronary artery disease and recent  diverticulitis status post resection.  The patient presents with  complaints of 3 days of chest pain which is somewhat sharp, left-sided.  It does not appear to be worse with movement.  At first was continuous  and now just coming and going.  She has been having some low grade  fevers, she says up to a month or so now and some chills.  She says  sometimes the chest pain is worse with breathing sometimes it does not  really matter if she is breathing or not.  She describes it as possible  spasm.  She did have some indigestion which scared her because last time  she had a heart attack she had indigestion as well.  She tried to take  some nitroglycerin without much improvement, at which point she  presented to the emergency department.   REVIEW OF SYSTEMS:  Significant for fever, fatigue and mild chills for  the past month or so.  Otherwise unremarkable.   PAST MEDICAL HISTORY:  1. Significant for coronary artery disease status post drug-eluting      stent x2 in 2007.  She has repeated catheterization in 2008 which      showed patent stents.  2. Hypertension.  3. Hyperlipidemia.  4. Hypothyroidism.  5. COPD/asthma.  6. Bilateral knee replacement.  7. Fibromyalgia.  8. GERD.  9. IBS.  10.Anxiety.  11.Recent admission for diverticulitis status post colon resection      with complication of infection and with  open wound healing by      secondary healing.   ALLERGIES:  Allergic to SULFA.   MEDICATIONS:  1. Claritin as needed.  2. Cozaar 100 mg daily.  3. Coreg 12.5 mg twice a day.  4. Levothyroxine line 125 mcg.  5. Clonazepam she says 3 times a day but unsure what dose.  6. __________ 5 mg daily.  7. Accolate.  8. Advair 1 puff b.i.d.  9. Lipitor dose unknown daily.  10.Prilosec.  11.Nitroglycerin as needed.  12.Skelaxin 800 mg twice a day.  13.__________ 10 mg daily.  14.Tylenol as needed.  15.Zetia 10 mg daily.  16.ProAir as needed.   SOCIAL HISTORY:  Does not smoke or drink or use drugs.   FAMILY HISTORY:  Noncontributory.   PHYSICAL EXAMINATION:  VITALS:  Temperature of 98.9, blood pressure  194/99 at first, now down to 174/77, pulse 77, respirations 19, satting  98% on room air.  GENERAL:  The patient appears to be currently in no  acute distress.  Slightly obese female.  HEAD:  Nontraumatic.  Moist mucous membranes.  LUNGS:  Clear to auscultation bilaterally.  HEART:  Regular rate and rhythm with no rubs or gallops.  There is noted  to be a mild systolic murmur noted.  ABDOMEN: Soft, nontender, nondistended.  There is an open wound on the  abdomen covered by a clean dressing.  EXTREMITIES:  Lower extremities without clubbing, cyanosis or edema but  somewhat obese.  SKIN:  As described above.  NEUROLOGIC:  Appears to be intact.   LABORATORY DATA:  White blood cell count 10.1, hemoglobin 11, sodium  142, potassium 3.7, creatinine 1.38 which is slightly above her  baseline.  Cardiac enzymes negative.  I-STAT:  BNP 125.  EKG showed  normal sinus rhythm with Q-waves in lead 3.  D-dimer is 0.37.  Chest x-  ray showed mild cardiomegaly and mild chronic bronchitis.  Otherwise  unremarkable.   ASSESSMENT AND PLAN:  1. This is a 60 year old female with multiple medical problems.  There      is an open wound still healing and there are some low grade fevers      at home and  now also chest pain with known history of coronary      artery disease.  2. Chest pain.  Appreciate cardiology consult.  Would need their help      to arrange for a stress test while inpatient.  For now we will      cycle cardiac markers, serial EKGs.  Since the patient is now chest      pain free we will watch in the step-down.  Her chest pain does not      seem to be typical but given risk factors will continue to cycle      markers.  We will check fasting lipid panel, hemoglobin A1c.  Given      low grade fevers we will check 2-D echo to evaluate for any      valvular abnormality or wall motion abnormality, any evidence of      endocarditis.  We will continue Plavix, Lipitor, Zetia and Coreg.  3. Low grade fever could be related to her recent wound.  We will      obtain blood cultures and wound cultures.  Have wound care consult.      The patient currently on no antibiotics.  We will also check UA      plus urine culture.  We will first try to find the source prior to      initiating antibiotics and see if patient is actually febrile in      house.  4. Status post operation for diverticulitis with a sigmoid colectomy      and open incisional hernia repair in December 2009 with still open      wound.  Would appreciate surgical input.  Would recommend surgery      consult in a.m. to evaluate the wound and see if it is healing the      way they would like it to heal.  5. History of fibromyalgia and anxiety and depression.  Will continue      clonazepam at the minimal dose.  6. Creatinine elevation unsure of etiology.  Will obtain renal      ultrasound, orthostatics, urine creatinine and urine sodium, give      gentle IV fluids and follow creatinine.  We will also hold Cozaar.  7. Hypertension.  For now we will put  Cozaar on hold.  We will      continue Coreg.  We may need to increase the dose if the patient is      persistently hypertensive and provide hydralazine p.r.n.  8.  Prophylaxis.  Protonix plus Lovenox.   __________ her partners will assume care in a.m.      Michiel Cowboy, MD  Electronically Signed     AVD/MEDQ  D:  02/15/2009  T:  02/15/2009  Job:  [id number]   cc:   Corwin Levins, MD

## 2011-03-20 NOTE — H&P (Signed)
NAMEMILLISSA, DEESE NO.:  [id number]   MEDICAL RECORD NO.:  [id number]          PATIENT TYPE:  INP   LOCATION:  1829                         FACILITY:  [facility]   PHYSICIAN:  Arturo Morton. Riley Kill, MD, FACCDATE OF BIRTH:  [birth year]   DATE OF ADMISSION:  06/18/2007  DATE OF DISCHARGE:                              HISTORY & PHYSICAL   SUMMARY OF HISTORY:  Ms. Bacha is a 60 year old white female who  presents to the [facility] Emergency Room via private vehicle secondary  to chest discomfort.  She states that she stopped her aspirin and Plavix  on Monday after being instructed by Dr. Sable Feil office in plans for  her upcoming knee replacement on Monday.  She saw Dr. Eden Emms in the  office yesterday for preoperative clearance.  Last night, however,  around 7:00 p.m. she became very hot and did not feel well.  She  attributed this to the weather and being outside given her history of  asthma.  However, shortly thereafter, this was followed by left-sided  anterior chest indigestion-like feeling with sharp pains in her left  axilla and going down her left arm associated with shortness of breath  and nausea.  She laid down and the discomfort went from a 5 to a zero.  She showered, however similar symptoms reoccurred at 11:00 p.m.  She  cannot remember at that time if she took nitroglycerin or not.  She  continued to have intermittent episodes throughout the night; she cannot  describe the duration or the frequency.  She states that she was unable  to rest; some point during the night she took Tums and aspirin without  relief.  At some point, she checked her blood pressure and it was  190/100 with a pulse of 85.  She thinks around 4:00 a.m. she  specifically took three sublingual nitroglycerin and this did not change  her discomfort.  She feels the symptoms are similar to her myocardial  infarction; thus, her husband drove her to the emergency room.   Ms. Rapaport does  relate a history of chronic chest discomfort, but she  does not frequent the emergency room or her physicians with phone calls.  She states that the discomfort last night was different than her usual  pain and felt that it was similar to her myocardial infarction.   PAST MEDICAL HISTORY:  Notable for:  1. SULFA ALLERGY.  2. SHE HAS A STATIN INTOLERANCE WITH CONSTIPATION AND AGGRAVATION OF      HER IRRITABLE BOWEL SYNDROME.   MEDICATIONS PRIOR TO ADMISSION:  Include:  1. ProAir 8.5 mg two tablets q.4 hours p.r.n.  2. Prilosec 20 mg b.i.d.  3. Claritin-D daily.  4. Nose spray q.12 hours.  5. Advair 250/50 b.i.d.  6. Zetia 10 mg q.d.  7. Levothyroxine 125 mcg q.d.  8. Coreg 12.5 one-and-a-half tablets b.i.d.  9. Cozaar 100 mg q.d.  10.Skelaxin 800 mg b.i.d.  11.Nitroglycerin 0.4 p.r.n.  12.Singulair 10 mg q.d.  13.Tylenol Arthritis 650 two tablets q.8 hours.  14.Bonine for nausea.  15.Her Plavix 75 mg q.d. was stopped on  Monday.  16.She states that infrequently she will take aspirin.  She is under      the impression when she takes ibuprofen for her arthritis that this      is the same as aspirin for her heart.   PAST MEDICAL HISTORY:  In addition to the above is notable for:  1. Inferior myocardial infarction in August of 2007 with chronic chest      discomfort.  Last catheterization on December 20, 2006 shows an EF      of 60%, posterior basilar hypokinesis, 30% proximal and mid-LAD, 70      to 80% diagonal 1, 30% ostial ramus, 30% ostial circ, 30% ostial      OM1, 30% in-stent Taxus restenosis of the RCA with a GL PDA, 40%      PLV.  Last adenosine Myoview was on April 19, 2002 which showed an      EF of 84%, no ischemia or scar.  2. History is also notable for somatization disorder.  3. Anxiety disorder,  4. Fibromyalgia.  5. Hypertension; she does check at home, but she does not know a      range.  6. Hyperlipidemia WITH MULTIPLE STATIN INTOLERANCES.  7. Hypothyroidism.   8. Asthma.  9. Hiatal hernia.  10.Irritable bowel syndrome.  11.Diverticulitis.  12.Status post cholecystectomy.  13.Hysterectomy.  14.Possible history of TIA.   SOCIAL HISTORY:  She resides in [place], [place] with her husband.  She has  two children, two grandchildren, and she is a housewife.  She has never  smoked.  She denies alcohol or drugs.  She tries to maintain low fat,  low-salt diet.  She does not exercise.   Her mother is alive at the age of 31, alive and well.  Her father is  deceased with a myocardial infarction.  She has a brother deceased with  diabetes and possible heart problems.  She has a sister alive and well.   REVIEW OF SYSTEMS:  In addition to the above is positive for a 40-pound  weight gain since August of 2007, occasional sweats, occasional  headaches, sinus discharge, chronic dyspnea on exertion which is not  changed recently, dry cough, edema which decreases at night.  Denies  snoring.  She admits to nocturia, postmenopausal, arthralgias all over,  particularly in her knees, GERD, abdominal discomfort.   PHYSICAL EXAMINATION:  GENERAL:  Well-nourished, well-developed,  pleasant, obese white female, no apparent distress.  Husband is present.  VITAL SIGNS:  Temperature is 98.5, initial blood pressure is 158/82 and  regular pulse 18, 97% sats on room air.  HEENT:  Grossly unremarkable.  NECK:  Supple without thyromegaly, adenopathy, JVD or carotid bruits.  CHEST:  Symmetrical excursion.  Clear to auscultation.  HEART:  PMI is not displaced.  Regular rate and rhythm.  Do not  appreciate any murmurs, rubs, click or gallops.  All pulses are  symmetrical and intact without abdominal or femoral bruits.  SKIN:  Integument is intact.  ABDOMEN:  Obese.  Bowel sounds present.  Slight diffuse tenderness  without organomegaly, masses or tenderness.  EXTREMITIES:  Negative cyanosis, clubbing or edema.  Peripheral pulses  are symmetrical and intact.   MUSCULOSKELETAL/NEURO:  Unremarkable.   Chest x-ray shows low lung volumes, no focal airspace disease.   EKG shows normal sinus rhythm, ventricular rate 82, normal axis, normal  intervals, decreased voltages compared to an EKG in August of 2007.   H&H was 12.6 and 37.8, normal indices, platelets 402.  WBC 9.1.  Sodium  139, potassium 4.1, BUN 8, creatinine 0.7, glucose 130.  Normal LFTs.  CK-MB was 130 and 9.8.  Troponin was 0.05.  PTT 128, PT 13.6, INR 1.0.  Lipase 25.   IMPRESSION:  1. Unstable angina with initial EKG that does not show any acute      changes.  2. Point-of-care markers are slightly abnormal with an abnormal CK-MB      and troponin, hypertension.  3. Recent cessation of aspirin and Plavix with upcoming knee surgery.      History is noted as per Past Medical History.   DISPOSITION:  Dr. Riley Kill reviewed the patient's history, spoke with and  examined the patient and agrees with the above.  We will admit her,  continue her on IV heparin, nitroglycerin and add Integrilin, rule her  out for myocardial infarction.  Anticipate cardiac catheterization;  hopefully we will be able to perform this today depending on the  schedule.  We will continue her home medications.  We will resume her  aspirin.  We will continue to hold the Plavix until further evaluation.      Joellyn Rued, PA-C      Arturo Morton. Riley Kill, MD, [facility]  Electronically Signed    EW/MEDQ  D:  06/18/2007  T:  06/18/2007  Job:  [id number]   cc:   Jonny Ruiz L. Rendall, M.D.  Dr. Sima Matas C. Eden Emms, MD, [facility]

## 2011-03-20 NOTE — Op Note (Signed)
[name], [name]               ACCOUNT NO.:  [id number]   MEDICAL RECORD NO.:  [id number]          PATIENT TYPE:  INP   LOCATION:  1313                         FACILITY[facility]   PHYSICIAN:  Sandria Bales. Ezzard Standing, M.D.  DATE OF BIRTH:  Nov 16, 1950   DATE OF PROCEDURE:  DATE OF DISCHARGE:  11/29/2008                               OPERATIVE REPORT   Date of procedure ?   PREOPERATIVE DIAGNOSIS:  Bleeding infected abdominal wound.   POSTOPERATIVE DIAGNOSIS:  Bleeding infected abdominal wound.   OPERATION/PROCEDURE:  Debridement of wound with suture of subcutaneous  bleeders.   SURGEON:  Sandria Bales. Ezzard Standing, M.D.   ANESTHESIA:  Approximately 15 mL 1% Xylocaine plain.   COMPLICATIONS:  None.   INDICATIONS FOR PROCEDURE:  [name] is a 60 year old black female  who sees Dr. Oliver Barre, who is her primary care doctor, Dr. Ritta Slot  from a GI standpoint, Dr. [name] from a cardiac standpoint, had a  laparoscopic sigmoid colectomy by Dr. Avel Peace on November 04, 2008.   Postoperative course has been plagued with a wound infection.  Dr.  Abbey Chatters opened this wound today but she is on Plavix.  Started  bleeding at home.  She says she changed several soaked bandages. They  live in [place], about 45 minutes from [facility].  They met me  in the emergency room for me to evaluate the wound.   PROCEDURE:  In the emergency room, I was able to clean the wound up with  saline.  There is a foul smelling discharge from the wound.  I  identified at least 2 subcutaneous bleeders. I sutured the bleeders with  three 3-0 Vicryl sutures.  Before doing that I infiltrated her  subcutaneous tissues with about 15 mL of 1% Xylocaine.   Because of her distance from home and the amount of bleeding, I will  keep her overnight and check her blood count in the morning.      Sandria Bales. Ezzard Standing, M.D.  Electronically Signed     DHN/MEDQ  D:  11/29/2008  T:  11/30/2008  Job:   [id number]   cc:   Adolph Pollack, M.D.  [address], [address]  [place]  [place] [address]

## 2011-03-20 NOTE — Assessment & Plan Note (Signed)
[facility]                            CARDIOLOGY OFFICE NOTE   NAME:[name], [name]                      MRN:          [id number]  DATE:06/17/2007                            DOB:          07-Feb-1951    REASON FOR VISIT:  [name] seen by me today as a new patient. She  was referred by Dr. Priscille Kluver for preoperative clearance. Actually, her  surgery is scheduled for this Monday. She is to have right knee surgery.   HISTORY OF PRESENT ILLNESS:  The patient is a fairly complicated patient  of Dr. Samule Ohm, as seen in the past. She had inferior wall MI in [place]  of 2007. This actually occurred just after her left knee replacement.  She has 2 Taxus drug eluting stents placed to the right coronary artery.  She had some small branch diagonal disease. Since that time, she has  been re-cathed, I believe 3 times without residual stenosis. The patient  has significant fibromyalgia and chronic pain syndrome.   In talking to the patient, she really is not having exertional angina.  Her pains seem more musculoskeletal in nature. Her fibromyalgia also  bothers her legs but includes her shoulder, mid back, and chest.   The patient's current activity has been limited by her right knee pain.  She does have chronic exertional dyspnea and notes significant weight  gain in the last year.   Her postoperative left knee surgery by Dr. Brynda Greathouse was complicated by an  inferior wall myocardial infarction, however, she had no intraoperative  complications. No DVT, no congestive heart failure.   She has already been instructed to stop her Plavix 2 days ago.   REVIEW OF SYSTEMS:  Otherwise remarkable for some headaches. She has  also noted some flare in her asthma. I do not have PFT's on the patient.  She says she has been compliant with her medications. She has multiple  drug intolerances.   CURRENT MEDICATIONS:  Cozaar 100 daily, Zetia 10 daily, Zartan p.r.n.,  Synthroid 125 mcg daily, Advair 250/500, Singulair, Prilosec over-the-  counter, Coreg 12.5 1 1/2 tablets b.i.d., Skelaxin 800 b.i.d., Plavix to  be held.   PAST MEDICAL HISTORY:  Previous hysterectomy, previous cholecystectomy,  somatization disorder, fibromyalgia, hyperlipidemia, central obesity,  hypothyroidism, hypertension, and osteoarthritis of the knees.   ALLERGIES:  SULFA, CRESTOR, LIPITOR, ZOCOR, PRAVACHOL, LOVASTATIN.   FAMILY HISTORY:  Noncontributory.   SOCIAL HISTORY:  The patient is happily married. Her husband was with  her. She has 2 grown children in their 75's. Her daughter has  interstitial nephritis and has similar somatization problems. She does  not smoke or drink. She is sedentary and does not work.   REVIEW OF SYSTEMS:  Otherwise negative.   PHYSICAL EXAMINATION:  GENERAL:  An overweight white female in no  distress.  NEUROLOGIC:  Affect is appropriate.  VITAL SIGNS:  Weight 204. Blood pressure 130/80, pulse 80 and regular.  Respiratory rate 14. She is afebrile.  HEENT:  Normal. Carotids normal without bruits. No lymphadenopathy, no  thyromegaly, no JVP elevation.  LUNGS:  Clear with  diaphragmatic motion. No active wheezing or asthma.  CARDIOVASCULAR:  S1 and S2. Distant heart sounds.  ABDOMEN:  Protuberant. Bowel sounds positive. No AAA. No tenderness, no  hepatosplenomegaly, or hepatojugular reflux.  EXTREMITIES:  Distal pulses are intact. Femoral's are +3 with no bruits.  PT's are +2. There is trace lower extremity edema.  NEUROLOGIC:  Non-focal.  MUSCULOSKELETAL:  There is no muscular weakness. She has a left knee  replacement scar on the left and decreased range of motion  with some  crepitus in the right knee joint.   LABORATORY DATA:  Her baseline EKG shows left atrial enlargement.  Otherwise, normal.   IMPRESSION:  1. Coronary artery disease, previous inferior wall myocardial      infarction. Stent patent by multiple catheterizations post       intervention in April. The patient is about a year out now from her      IMI adn I think it is reasonable for her to proceed with surgery on      Monday. Her Plavix has been held, as well as her aspirin. She will      continue her beta blocker right up to the time of surgery.  2. Chest pain syndrome, not related to angina. Somatization disorder      with fibromyalgia. She actually may see a fibromyalgia specialist      after her knee surgery. Continue Skelaxin for the time being.  3. Dyspnea related to weight gain and obesity. Question history of      asthma, on multiple inhalers. This actually may be more of a      problem postoperatively. As far as I know, she has not seen a lung      doctor and she may end up needing some pulmonary toilet      postoperatively.  4. Mild lower extremity edema. Currently dependent. She will need deep      vein thrombosis prophylaxis Lovenox. I suspect that      postoperatively, she will need low dose Lasix.  5. She will need to be monitored closely perioperatively, given her      recent IMI after her left knee replacement. I have written her a      note to clear her for surgery and also a note to notify us when she      is in the recovery room, so we can follow her on telemetry.  6. The patient understands that there is always some risk involved      with surgery, particularly      since we are stopping her Plavix but the risk/benefit ratio would      appear to favor her proceeding with right knee replacement with Dr.      Priscille Kluver.     Noralyn Pick. Eden Emms, MD, [facility]  Electronically Signed    PCN/MedQ  DD: 06/17/2007  DT: 06/18/2007  Job #: [id number]

## 2011-03-20 NOTE — Op Note (Signed)
[name], [name]               ACCOUNT NO.:  [id number]   MEDICAL RECORD NO.:  [id number]          PATIENT TYPE:  INP   LOCATION:  0002                         FACILITY:  [facility]   PHYSICIAN:  John L. Rendall, M.D.  DATE OF BIRTH:  17-Dec-1950   DATE OF PROCEDURE:  08/11/2007  DATE OF DISCHARGE:                               OPERATIVE REPORT   PREOPERATIVE DIAGNOSIS:  Osteoarthritis right knee with deformity.   POSTOPERATIVE DIAGNOSIS:  Osteoarthritis right knee with deformity.   SURGICAL PROCEDURES:  Right LCS total knee replacement with computer  navigation assistance.   SURGEON:  John L. Rendall, M.D.   ASSISTANTArlys John D. Petrarca, P.A.-C.   ANESTHESIA:  General with femoral nerve block.   PATHOLOGY:  The patient has central obesity and heart disease, but she  additionally has a bad varus knee estimated at about 7-8 degrees and due  to the size and shape of her thigh it is difficult to determine total  correction, i.e., a straight leg without use of computer navigation.   PROCEDURE:  Under general anesthesia, the right leg was prepared with  DuraPrep and draped as a sterile field.  A sterile proximal thigh  tourniquet is used.  Legs wrapped out with the Esmarch and the  tourniquet is used at 350 mm.  Midline incision is made.  The patella is  everted laterally.  The femur is sized at a medium.  Debridement is done  in preparation for computer mapping.  Once this is completed, Schanz  pins are placed through punctures in the superior medial tibia skin and  in the distal medial femur.  The arrays are set up the femoral head is  identified.  The medial and lateral malleolus are identified.  Proximal  tibia and distal femur are mapped and verified to within less than 1 mm.  Once this is completed, the proximal tibial resection is carried out  using the computer for placement of the cut.  Once this is completed  within 1 degree of anatomic alignment, the ligament balancing is  done.  The knee is initially just pulled out straight in 7 degrees varus,  progressive release of the medial structures including the MCL,  semimembranosus and posterior capsule and posterior capsule on the tibia  are all done.  Still it is not quite enough and piecrust tenotomy of the  pes anserine tendons is then done and this allows correction of the  varus to within about 1-1/2 degrees.  With this completed, the first  femoral cuts are made.  According to the computer, medium is the correct  size.  Anterior and posterior flare of the distal femur are resected  with correct rotation.  Distal femoral cut is made and remade to allow  balance flexion and extension gaps.  Once this is done, lamina spreader  is inserted.  Remnants of menisci are resected along with cruciate  ligaments.  Once this is complete, the recessing guide is used, then the  proximal tibia is exposed.  It is sized at a 1.5 which is quite small.  A center peg hole with  keel are inserted.  A trial of a #10 bearing,  trial of a femoral size medium shows excellent fit, alignment and  stability within 1-1/2 degrees of anatomic alignment, still in slight  varus but obtaining full extension.  The patella is then osteotomized  and three peg holes placed.  The computer arrays are then taken down.  Bony surfaces are prepared with pulse irrigation and all components are  cemented in place.  Once the cement hard is hardened at an hour and 18  minutes, the tourniquet is let down.  Several small vessels are  cauterized.  The wound is then closed in layers with #1 Tycron,  #1 and 2-0 Vicryl and skin clips.  Operative time approximately an hour  and a half.  The patient's cardiac status was relatively stable through  the whole procedure.  She ran a relatively low blood pressure and her  oxygenation varied but remained in a healthy range.  She will be in a  monitored bed due to cardiac history postop.      John L. Rendall, M.D.   Electronically Signed     JLR/MEDQ  D:  08/11/2007  T:  08/11/2007  Job:  [id number]

## 2011-03-20 NOTE — Consult Note (Signed)
NAME:  [name], [name] NO.:  [id number]   MEDICAL RECORD NO.:  [id number]          PATIENT TYPE:  EMS   LOCATION:  MAJO                         FACILITY:  [facility]   PHYSICIAN:  Therisa Doyne, MD    DATE OF BIRTH:  Apr 19, 1951   DATE OF CONSULTATION:  DATE OF DISCHARGE:                                 CONSULTATION   I was asked to see this patient for evaluation of chest pain by the  emergency department physicians.   CHIEF COMPLAINT:  Chest pain.   HISTORY OF PRESENT ILLNESS:  A 60 year old white female with a past  medical history significant for coronary disease status post  percutaneous coronary intervention in 2007 to the right coronary artery  with subsequent cardiac catheterization in 2008 which revealed patent  stents.  She presents for evaluation of 1 month of symptoms of chest  pain.  In talking with the patient she reports she has felt that she has  had bronchitis.  She reports low grade fever for the past 1 month as  well as a mildly productive cough and spasms in her chest.  She reports  mild pleuritic chest pain that has increased in frequency over the past  2-3 days.  It has been unrelieved with nitroglycerin and occurs both  with rest and with exertion.  She self-reports a 58-month history of low  grade fevers with temperatures ranging up to 100-101 as well as  intermittent fatigue.  Of note, she recently had abdominal surgery for  diverticular disease which was complicated by an abdominal wound which  is currently healing by secondary intention.  For her low grade fever,  she was seen and evaluated by 1 of her primary care providers.  She as  started on ciprofloxacin for possible urinary tract infection.   REVIEW OF SYSTEMS:  Also is reviewed and negative except as mentioned  above is history of present illness.   PAST MEDICAL HISTORY:  1. Coronary artery disease status post drug-eluting stent placement to      RCA in 2007.  She had a cardiac  catheterization in 2008 which      revealed patent stents in the right coronary artery, nonobstructive      disease in the left system with normal left ventricular function.  2. Hypertension.  3. Hyperlipidemia.  4. Hypothyroidism.  5. COPD on no medical treatment.  6. Bilateral knee replacements.  7. Fibromyalgia.  8. Gastroesophageal reflux disease.  9. Irritable bowel syndrome.  10.Anxiety.   SOCIAL HISTORY:  The patient lives at home with husband.  She denies any  tobacco, alcohol or drugs.   FAMILY HISTORY:  The patient's father has an extensive history of  coronary disease.   ALLERGIES:  1. SULFA.  2. LIPITOR.   MEDICATIONS:  1. Aspirin 325 mg daily.  2. Plavix 75 mg daily.  3. Cozaar 100 mg daily.  4. Coreg 50 mg b.i.d.  5. Synthroid 125 mcg daily.  6. Prilosec 30 mg b.i.d.  7. Skelaxin 500 mg b.i.d.  8. Zetia 10 mg daily.  9. Cipro 500 mg b.i.d.  10.Imdur 60 mg daily.   PHYSICAL EXAMINATION:  VITAL SIGNS:  Temperature afebrile at 98.9, blood  pressure is 197/99 with a repeat blood pressure of 156/70, pulse 77,  respirations 18, oxygen saturation 95% on room air.  GENERAL:  No acute distress.  HEENT:  Normocephalic, atraumatic.  Pupils are equal, round and reactive  to light and accommodation.  Extraocular movements are intact.  Oropharynx is pink and moist without any lesions.  NECK:  Supple.  There is 5 cm of jugular distention above the clavicle.  CARDIOVASCULAR:  Regular rate and rhythm with a 2/6 systolic murmur  heard at the left upper sternal border and 1/6 diastolic murmur heard at  the left upper sternal border.  CHEST:  Clear to auscultation bilaterally.  ABDOMEN:  Positive bowel sounds, Soft, nontender, nondistended.  EXTREMITIES:  Cyanosis, clubbing or edema.  Dorsalis pedis pulses 2+  bilaterally.  SKIN:  No rash.  BACK:  No CVA tenderness.   DATA:  1. Per laboratory data shows elevated creatinine of 1.4 which is up      from her baseline of  0.9, hemoglobin of 11 with a normal MCV but an      elevated RDW, indicating a possible mixed microcytic and macrocytic      anemia.  Troponin less than 0.005.  2. EKG shows normal sinus rhythm at 82 beats per minute with no acute      ST or T wave changes.  3. Chest x-ray shows no acute cardiopulmonary disease.   ASSESSMENT/PLAN:  A 60 year old white female with a past medical history  significant for coronary disease status post PCI in 2007 as well as  cardiac catheterization in 2008 revealing nonobstructive coronary  disease who recently underwent diverticular surgery which was  complicated by an abdominal wound who now presents with 1 month of low  grade fevers, pleuritic chest pain, anemia, acute renal failure and now  chest pain.   1. Chest pain.  The patient's symptomatology is somewhat complex and I      doubt that her chest pain is primarily related to an acute coronary      syndrome.  Her symptoms are somewhat atypical for coronary disease      and her EKG and negative cardiac biomarkers makes acute coronary      syndrome less likely.  However, the patient has risk factors and I      would recommend ruling the patient out for myocardial infarction      and continue on medical therapy with aspirin, Plavix, beta-blocker      and Zetia and it would be prudent to perform a stress test prior      discharge or as an outpatient depending on her clinical course.  I      would recommend checking an echocardiogram as she has evidence of      new systolic and diastolic heart murmurs.  Certainly there are      other etiologies for her chest pain including related to her      elevated systolic blood pressures as well as bronchitis or an acute      pulmonary infection.   1. Low grade fevers.  The patient reports that she has had a history      of fevers but more recently in the past 1 month she has had      progressive low grade fevers.  In the setting of this recent      abdominal wound  I am  concerned about some systemic pathology and      would recommend medicine evaluation.  I have spoken with Dr.      Therisa Doyne who has agreed to see the patient for admission      and cardiology will be following.  Would recommend an      echocardiogram to rule out any evidence of endocarditis in light of      her murmurs and valvular heart disease.   1. Anemia, per medicine.  Likely a mixed microcytic and macrocytic      anemia.   1. Acute renal failure.  Check urinalysis.   1. We will continue to follow along with you.   Thank you for allowing [place] to participate in this patient's care.  Please  feel free to contact us if you have any further questions.      Therisa Doyne, MD  Electronically Signed     SJT/MEDQ  D:  02/15/2009  T:  02/15/2009  Job:  [id number]

## 2011-03-20 NOTE — Discharge Summary (Signed)
[name], [name]               ACCOUNT NO.:  [id number]   MEDICAL RECORD NO.:  [id number]          PATIENT TYPE:  OBV   LOCATION:  3736                         FACILITY:  [facility]   PHYSICIAN:  Valerie A. Felicity Coyer, MDDATE OF BIRTH:  01-Jun-1951   DATE OF ADMISSION:  02/15/2009  DATE OF DISCHARGE:  02/17/2009                               DISCHARGE SUMMARY   DISCHARGE DIAGNOSES:  1. Chest pain, likely pleurisy, cardiac evaluation negative.  See      details below.  2. History of coronary artery disease, status post right coronary      artery stent in 2007, status post Cardiology evaluation without      acute cardiac issues.  Continue medical management.  3. Cardiac murmur with trivial aortic stenosis on echo.  No evidence      of subacute bacterial endocarditis or other valvular abnormality.  4. Chronic abdominal wound, was healing.  Status post colectomy      December 2009, for complicated diverticulitis, status post      resection.  Status post evaluation by Dr. Abbey Chatters this      hospitalization without acute issue.  Outpatient followup next week      as scheduled.  5. Hypokalemia, replaced and normalized.  6. Dehydration with renal insufficiency at time of admission, status      post IV fluid hydration that resolved, discharge creatinine 0.6.  7. Hypertension.  8. Hypothyroidism.  9. Dyslipidemia.  10.History of fibromyalgia with anxiety and depression.  11.Gastroesophageal reflux disease.  12.Status post remote bilateral knee replacement.   DISCHARGE MEDICATIONS:  1. Indocin 25 mg p.o. b.i.d. x5 days scheduled and p.r.n. b.i.d.      pleuritic pain.  2. Anusol-HC suppository for hemorrhoids 1 q.6 h. p.r.n. symptoms.   OTHER MEDICATIONS:  As prior to admission without change that include:  1. Plavix 75 mg daily.  2. Advair 250/50 b.i.d.  3. Protonix 40 mg daily.  4. Zetia 10 mg daily.  5. Synthroid 125 mcg daily.  6. Cozaar 100 mg daily.  7. Skelaxin 800 mg b.i.d.  8. Singulair 10 mg daily.  9. Lipitor 20 mg daily.  10.Aspirin 81 mg daily.  11.Coreg 12.5 mg b.i.d.   DISPOSITION:  The patient is discharged home in medically stable and  improved condition.   Hospital followup is scheduled with [facility] for an exercise  stress test on February 22, 2009, at 8:15 a.m.  The patient to call [phone] to confirm test per Dr. Eden Emms prior to time of appointment.  We  will schedule to then follow up with cardiologist Dr. Eden Emms on March 03, 2009, 3:15 p.m. to review results of stress test and other ongoing  medical management.  Follow up will be with surgeon Dr. Abbey Chatters next  week as scheduled and with primary care physician Dr. Oliver Barre as  needed [phone], the patient to call for appointment.   CONDITION ON DISCHARGE:  Still with mild pleuritic chest pain, but  symptomatically better, hemodynamically stable and afebrile.   HOSPITAL COURSE:  Chest pain with pleurisy.  The patient is a 60-year-  old woman with history of known coronary artery disease, status post  stent in 2007.  Also, with chronic abdominal wound since December 2009,  following complicated diverticulitis with partial colectomy and wound  infection,, which has been healing by secondary intention, came to the  emergency room complaining of chest pain associated with low-grade fever  and chills.  She was seen in consultation by [facility], but  felt to be non-acutely cardiac and deferred to the hospital service for  admission because of the associated reported fever and chills.  Blood  cultures were obtained, they were unfortunately negative for any growth.  Cardiology did ascertain what appeared to be a new echocardiogram and  she underwent a 2-D echo for further evaluation with possible SBE in the  setting of known abdominal wound with reported fever and chills, but  there was no vegetations appreciated only trivial AS, which may account  for her murmur symptoms.   A D-dimer was performed and within normal  range, so exclusion of a PE was made on this diagnosis.  She was treated  with anti-inflammatories and morphine for control of her pain and the  symptoms have improved during this hospitalization.  She has been  without further fevers during this hospitalization and is felt medically  stable for discharge home having been cleared by Cardiology as well as  General Surgery during this time.  Further outpatient stress testing and  exercise Cardiolite, which has been arranged by Cardiology next week as  described, the patient to call and confirm with her cardiologist Dr.  Eden Emms prior to test and follow up as needed on these issues.  The  patient's other chronic medical issues are stable and as listed above.  No changes have been made in her regimen except for the addition of  Indocin and Anusol as described.      Valerie A. Felicity Coyer, MD  Electronically Signed     VAL/MEDQ  D:  02/17/2009  T:  02/18/2009  Job:  [id number]

## 2011-03-20 NOTE — Discharge Summary (Signed)
[name], [name]               ACCOUNT NO.:  [id number]   MEDICAL RECORD NO.:  [id number]          PATIENT TYPE:  INP   LOCATION:  1410                         FACILITY:  [facility]   PHYSICIAN:  John L. Rendall, M.D.  DATE OF BIRTH:  04/21/1951   DATE OF ADMISSION:  08/11/2007  DATE OF DISCHARGE:  08/15/2007                               DISCHARGE SUMMARY   ADMISSION DIAGNOSES:  1. End-stage osteoarthritis right knee, status post left knee      replacement.  2. Hypertension.  3. Hypercholesterolemia.  4. Coronary artery disease with history of two myocardial infarctions.  5. Asthma.  6. Gastroesophageal reflux disease.  7. Chronic low back pain.  8. Hypothyroidism.  9. Fibromyalgia.  10.Irritable bowel syndrome.  11.Diverticulosis.   DISCHARGE DIAGNOSES:  1. End-stage osteoarthritis right knee, status post right total knee      arthroplasty.  2. Acute blood loss anemia secondary to surgery.  3. Preoperative urinary tract infection picked up on preoperative      laboratories.  4. Constipation.  5. Hypotension, now resolved.  6. History of osteoarthritis left knee, status post left knee      replacement.  7. Hypertension.  8. Hypercholesterolemia.  9. Coronary artery disease with history of two myocardial infarctions.  10.Asthma.  11.Gastroesophageal reflux disease.  12.Chronic low back pain.  13.Hypothyroidism.  14.Fibromyalgia.  15.Irritable bowel syndrome.  16.Diverticulosis.   SURGICAL PROCEDURES:  On August 11, 2007 [name] underwent a right  total knee arthroplasty with computer navigation by Dr. Jonny Ruiz L.  Rendall  assisted by Oris Drone. Petrarca, P.A.-C.  She had an LCS complete RP  insert, size medium, size 10 mm thickness, placed with a metal back  patella, LCS cemented size medium, an LCS complete primary femoral  component cemented, size medium right,  and a DePuy NBT keel tibial tray  cemented, size 1.5.  Complications:  None.   CONSULTANT:  1.  Physical therapy and case management consult, August 12, 2007.  2. Occupational therapy consult, August 13, 2007.  3. Cardiology consult, August 15, 2007.   HISTORY OF PRESENT ILLNESS:  This is a 60 year old white female patient,  presented to Dr. Priscille Kluver with a history of a left knee replacement in  2007 which has done well.  She has now had a 5 to 10-year history of  gradual onset progressively worsening right knee pain with a history of  knee scoped in 1984 and 2002.  Pain in the right knee is now  intermittent, sharp, stabbing sensation over the anterior and posterior  knee without radiation.  It increases with walking and decreases with  rest and elevation.  The knee pops, catches, grinds, locks, gives way,  swells and keeps her up at night.  She has failed conservative treatment  and x-rays show end-stage arthritic changes.  Because of this she is  presenting for a right knee replacement.   HOSPITAL COURSE:  Mrs. [name] tolerated her surgical procedure well  without any immediate postoperative complications.  She was transferred  to  the orthopedic floor.  Dr. Priscille Kluver did consult cardiology to see her  postoperatively.  On postop day #1, she reported one episode of chest  pain the night before which is kind of routine for her which resolved  without meds.  She was afebrile, vitals stable.  Hemoglobin 10.6,  hematocrit 31.4.  Preop UA had shown greater than 100,000 colonies per  mL of Klebsiella which was sensitive to Ancef and Cipro so she was  resumed on her Cipro which she had been started on preoperatively.  She  did have some nausea which is chronic for her and her meds were adjusted  to help that.  She was started on therapy per protocol.   Postoperative day #2, she was feeling better.  Pain was better  controlled.  She did have another episode of chest pain the night before  that did not require meds.  Foley was able to be discontinued.  T-max  101.1, vitals were stable.   Hemoglobin 10.1, hematocrit 30.4.  Right  knee dressing was changed, Hemovac discontinued, incision was well-  approximated with staples.  She was switched to p.o. pain meds, started  on some albuterol nebs for her asthma, and plans were made for discharge  home in the next several days.   On postoperative day #3, she had had some complaints of dizziness the  day before when out of bed.  She had had one episode of the chest pain  requiring nitroglycerin.  Her hemoglobin had dropped to 8.9 with  hematocrit of 26.5.  She was subsequently started on the transfusion of  packed red blood cells.  She did get hypotensive during that  transfusion.  A workup was obtained which was negative for a transfusion  reaction.  EKG and cardiac enzymes were all normal.  That transfusion  was held and 1 unit of blood was transfused after resolution of that  episode.  She did have some complaints of constipation that was treated  with a laxative.   On postoperative day #4, she was feeling much better.  Again one episode  of chest pain the night before requiring one nitroglycerin which  resolved it.  T-max 99.5, pulse 87, BP 106/64.  Right knee incision was  benign.  She still had a bit of hypertension.  Chest x-ray  was obtained  due to some shortness of breath which was negative for any active  disease.  Laxative was given for a bowel movement.  Dr. Daleen Squibb did see the  patient at that time and felt that she was stable from a cardiac  standpoint for discharge home.  She did well enough with therapy that  she was able to be discharged home later that day.   DISCHARGE INSTRUCTIONS:  Diet:  She can resume her regular  prehospitalization cardiac diet.   </   DISCHARGE MEDICATIONS:  She may resume her preop meds except no Plavix  at this time while on Arixtra.  No aspirin for arthritis pain or Ultram  while on other pain meds.  Home meds at this time include  1. Coreg 12.5 mg 3 tablets p.o. b.i.d.  2. Imdur  60 mg 1 tablet p.o. q.a.m.  3. Cozaar 50 mg 1 tablet p.o. q.p.m.  4. Plavix 75 mg 1 tablet p.o. q. a.m. to resume on August 17, 2007.  5. Singulair 10 mg 1 tablet p.o. q.a.m.  6. Zetia 10 mg 1 tablet p.o. q.a.m.  7. Skelaxin 800 mg 1 tablet p.o. b.i.d. p.r.n.  8. Aspirin 325 mg 1 tablet p.o. q.a.m. to be resumed on October  12.  9. Arthritis pain with Tylenol 2 pills p.o. q.8h. to be on hold at      this time.  10.NitroQuick 0.4 mg sublingually q. 5 minutes p.r.n. chest pain.  11.Ultram 50 mg 1-2 tablets p.o. q.4h. p.r.n. for pain. Not to be      taken while on other pain meds.  12.Levothyroxine 125 mcg 1 tablet p.o. q.a.m.  13.Advair discus 250/50 1-2 puffs twice a day as needed.  14.Nasal spray, oxymetazoline hydrochloride 1 spray each nostril twice      a day.  15.Albuterol inhaler inhaled 1-2 puffs q.4h. p.r.n.  16.Omeprazole 20 mg 1 tablet p.o. b.i.d.  17.Meclizine 25 mg 1 tablet p.o. b.i.d.   Additional meds at this time include:  1. Arixtra 2.5 mg subcu q. 9 p.m. with the last dose to be on October      12.  She is given two with no refill.  2. OxyContin 10 mg 1 tablet p.o. q.12h., 22 with no refill.  3. Percocet 5/325 mg 1 to 2 tablets p.o. q.4h. p.r.n. for pain, 60      with no refill.   ACTIVITY:  She can be out of bed weightbearing as tolerated on the right  leg with use of walker.  She is to have home CPM 0 to 100 degrees, 6 to  8 hours a day and home health PT per [facility].  No  lifting or driving for 6 weeks.  Please see the blue total knee  discharge sheet for further activity instructions.   FOLLOW UP:  She needs to follow up with Dr. Priscille Kluver in our office on  Tuesday, October 21 and needs to call [phone] for that appointment.   WOUND CARE:  She may shower after no drainage from the wound for 2 days.  Please see the blue total knee discharge sheet for further wound care  instructions.   LABORATORY DATA:  Hemoglobin/hematocrit have ranged from  11.6 and 35.4  on the October 2, to 8.9 and 26.5 on the October 9, to 10.1 and 29.9 on  October 10.  White count went from 6.3 on the October 2, to 11 on the  October 8, to 10.5 on the October 10.  Platelets went from 411 on the  October 2 to 352 on the October 10.   Glucose ranged from 131 on the October 2,  to a high of 140 on the  October 7, to 115 on the night.  BUN dropped to a low of 4 on October 7  but otherwise was within normal limits.   Urinalysis done on October 2 showed trace leukocyte esterase, few  epithelials, 0-2 white cells and red cells and the culture from October  2 grew out greater than 100,000 colonies per mil of Klebsiella  pneumoniae which was resistant only to the ampicillin and Macrodantin.      Legrand Pitts Duffy, P.A.      John L. Rendall, M.D.  Electronically Signed    KED/MEDQ  D:  08/28/2007  T:  08/29/2007  Job:  [id number]   cc:   Noralyn Pick. Eden Emms, MD, [facility]  [address]  [address]  [place]  [place] [address]

## 2011-03-20 NOTE — Assessment & Plan Note (Signed)
[facility]                            CARDIOLOGY OFFICE NOTE   NAME:[name], [name]                      MRN:          [id number]  DATE:09/17/2008                            DOB:          28-Sep-1951    [name] is a 60 year old patient with known coronary artery disease.  She  has had previous stenting of the right coronary artery.   She had a cath in August 2008, for her chest pain syndrome and her stent  was widely patent.   She has had recent right knee surgery in October 2008, by Dr. Priscille Kluver,  and this was successful.  In fact, post cardiac stent, she has had 2  knee surgeries and done well.  Unfortunately, she has developed a  sigmoid mass with diverticulitis.  She has had significant abdominal  pain and nausea.  She is having difficulty having bowel movements.  Apparently, Dr. Kinnie Scales could not pass a colonoscope.   The patient is to have surgery by Dr. Abbey Chatters.  In talking to [place],  I think she has at reasonable risk.  She has done well with her recent  surgery.  She is not having significant angina.  Her stent was widely  patent on cath and in a way she has good LV function.   Review of systems is remarkable for some dyspnea.  This is likely  related to some COPD and sleep apnea as well as her central obesity.   The patient does take Plavix.   The patient should stop her Plavix 5-7 days before the surgery.   Past medical history is otherwise remarkable for:  1. Bilateral knee replacements.  2. History of reflux.  3. History of diverticulitis.  4. History of hypertension.  5. Hyperlipidemia.  6. Hypothyroidism.  7. Seasonal allergies.  8. COPD.   The patient does not smoke or drink.  She is happily married.  Her  husband is with her today.  He is actually a previous patient of Dr.  Samule Ohm and needs to follow up for PVD.   The patient's ambulatory status is improved after her knee surgeries.   She has no known allergies.   Her current meds include:  1. Plavix 75 a day.  2. Omeprazole 20 b.i.d.  3. Skelaxin 800 b.i.d.  4. Cozaar 50 a day.  5. Zetia 10 a day.  6. Synthroid 125 mcg a day.  7. Singulair 10 a day.  8. Advair.  9. Aspirin.  10.Isordil 60 a day.  11.Vitamin E.  12.Carvedilol 50 b.i.d.  13.Acetaminophen.  14.Meclizine.   PHYSICAL EXAMINATION:  GENERAL:  Remarkable for an overweight white  female in no distress.  VITAL SIGNS:  Her weight is 201, blood pressure 160/80, pulse 76 and  regular, respiratory rate 14, afebrile.  HEENT:  Unremarkable.  NECK:  Carotids are normal without bruit.  No lymphadenopathy,  thyromegaly, or JVP elevation.  LUNGS:  Clear.  Good diaphragmatic motion.  No wheezing.  CARDIOVASCULAR:  S1, S2.  Normal heart sounds.  PMI normal.  ABDOMEN:  She has some lower quadrant tenderness.  No rebound.  Bowel  sounds positive.  No AAA.  No tenderness.  No bruits.  No  hepatosplenomegaly.  No hepatojugular reflux.  EXTREMITIES:  Distal pulses were intact.  No edema.  NEUROLOGIC:  Nonfocal.  SKIN:  Warm and dry.  MUSCULOSKELETAL:  No muscular weakness.   IMPRESSION:  1. Preoperative clearance for diverticular surgery and/or colonic      mass.  The patient will stop her Plavix 5-7 days before her      surgery.  She will continue her beta-blocker right up until the      morning of surgery.  The fact that she did well last year with her      knee replacements and is not having chest pain would bode well for      her doing well, we would be happy to see her in consultation or to      follow her in the hospital at [facility].  2. Hypercholesterolemia.  Continue current dose of statin and Zetia.      Lipid and liver profile in 6 months.  3. Hypothyroidism.  Continue Synthroid 124 mcg a day.  TSH was      recently normal.  4. She does have a degree of chronic obstructive pulmonary disease.  I      think that she does not need PFTs prior to her surgery.  She will       continue her inhalers.  5. Hypertension, currently well controlled.  Continue current dose of      Cozaar.   I will call Dr. Maris Berger office today to make sure he knows that she  is cleared for surgery and that her Plavix needs to be held 5-7 days  before.     Noralyn Pick. Eden Emms, MD, [facility]  Electronically Signed    PCN/MedQ  DD: 09/17/2008  DT: 09/17/2008  Job #: [id number]

## 2011-03-20 NOTE — Op Note (Signed)
NAMEVINCENTINA, [name] NO.:  [id number]   MEDICAL RECORD NO.:  [id number]          PATIENT TYPE:  INP   LOCATION:  0007                         FACILITY:  [facility]   PHYSICIAN:  Adolph Pollack, M.D.DATE OF BIRTH:  1951-05-28   DATE OF PROCEDURE:  11/04/2008  DATE OF DISCHARGE:                               OPERATIVE REPORT   PREOPERATIVE DIAGNOSES:  Sigmoid diverticulitis with sigmoid neoplasm.   POSTOPERATIVE DIAGNOSES:  Sigmoid diverticulitis with sigmoid neoplasm  (inflammatory neoplasm by gross pathologic evaluation).   PROCEDURE:  1. Laparoscopic lysis of adhesions times 1 hour.  2. Laparoscopic-assisted partial cecectomy  3. Laparoscopic-assisted sigmoid colectomy.  4. Incisional umbilical hernia repair.  5. Mobilization of splenic flexure.   SURGEON:  Adolph Pollack, M.D.   ASSISTANT:  Th[place]. Daphine Deutscher, MD.   ANESTHESIA:  General.   INDICATIONS:  This is a 60 year old female with intermittent left lower  quadrant pain and low grade fever for 6 months who had been diagnosed  with diverticulitis and had been placed on antibiotics.  She had an  attempted colonoscopy October of this year.  However, there was a severe  stricture and it could not be completed.  She underwent a virtual  colonoscopy demonstrating a soft tissue mass partially obstructing and  concerning for malignancy.  CT scan of the abdomen and pelvis was also  performed demonstrating the soft tissue mass, as well as diverticulosis  and umbilical hernia from a previous umbilical incision containing fat.  She now presents for elective resection.   TECHNIQUE:  She was seen in the holding area and brought to the  operating room, placed supine on the operating table and general  anesthetic was administered.  She was placed in the lithotomy position.  A Foley catheter was inserted.  The abdominal wall and perineum were  sterilely prepped and draped.  A small incision was made in the  left  upper quadrant and using a 5 mm OptiView trocar I gained access into the  peritoneal cavity and created pneumoperitoneum.  Upon introduction of  the camera I inspected the area underneath the trocar and noted no  evidence of organ injury or bleeding.   I noted adhesions in the hernia to the umbilical area where she had a  supraumbilical incision for a laparoscopic cholecystectomy.  I placed  two 5 mm trocars into the right mid abdomen and then began lysing  adhesions between the omentum and anterior abdominal wall and reducing  the omentum out of the umbilical hernia and was able to do this all the  way down to the lower midline and bladder cuff region.  I then added a  third trocar in the right upper quadrant.   Following this, I then mobilized the descending colon and the splenic  flexure dividing the lateral attachments and attachments between the  colon and the spleen and sweeping the splenic flexure of the colon  inferiorly using sharp and blunt dissection.  No injury to the spleen  was noted.  I then carried this down to the pelvis mobilizing the  sigmoid colon until I  came to a very firm inflammatory type mass.   I then made a extraction site lower abdominal incision to include the  umbilical hernia defect.  A wound protection device was placed.  I felt  a very hard mass and also noted that the cecum was adherent to this  inflammatory process at the area of the appendiceal stump.  I sharply  and bluntly removed the cecum from inflammatory process and then with  the appendiceal stump  exposed I did a partial cecectomy using a linear  non-cutting stapler and sent this specimen separately to pathology.   Following this, I used sharp dissection and stayed close to the colon  and noticed a very dense inflammatory tissue and scar as I was able to  divide with tissue scissors and used blunt dissection to free up a large  inflammatory type mass.  Distal to this mass at the  rectosigmoid  junction was soft, normal colon and I divided this using the linear  cutting stapler.  Proximal to the mass was the descending sigmoid colon  junction which I divided using the layer linear cutting stapler.  I  mobilized the descending colon some so that the distal portion would  reach the pubic bone.   The ureter was identified.  The mesentery was then divided using the  LigaSure.  The specimen was sent to pathology for gross inspection.  Gross inspection was consistent with diverticulosis and diverticulitis  and no obvious malignancy.   Following this, I removed the staple line to the descending colon and  placed a 2-0 Prolene pursestring suture, full-thickness.  A size 29 EEA  anvil was then placed and then the pursestring suture tied down around  it.  A rigid sigmoidoscopy had been performed and we could easily reach  the rectal stump.  The handle the 29 EEA was then placed through the  rectum after dilatation and then the anastomosis was performed for the  anterior side of the rectum to the descending colon using the EEA  stapler.  Two complete donuts were noted.  The anastomosis was placed  under water occluded proximally and placed under tension and no leak was  noted.   Following this, all gloves were changed and copious irrigation was  performed of the abdominal cavity.  No obvious bleeding was noted.  I  removed all of the trocars and the right lower quadrant trocar and  inserted a 19 Blake drain and placed it in the pelvis.  It was anchored  to the skin with 3-0 nylon suture.   At this point in time I then closed the extraction site lower midline  incision fascia with a  running double looped #1 PDS suture with two  retention sutures of #5 Ethibond.  All needle, sponge and instrument  counts were reported to be correct.  The subcutaneous tissue was  irrigated.  In the fascial closure it should be noted that I closed the  umbilical hernia defect as well.    The trocar site and skin incisions and the extraction site skin incision  were then closed with staples and sterile dressings were applied.   She tolerated procedure without apparent complications and was taken to  recovery in satisfactory condition.      Adolph Pollack, M.D.  Electronically Signed     TJR/MEDQ  D:  11/04/2008  T:  11/04/2008  Job:  [id number]   cc:   Griffith Citron, M.D.  Fax: [phone]   Corwin Levins,  MD  [address]  [place]  [place] [address]

## 2011-03-23 NOTE — Discharge Summary (Signed)
[name], [name] NO.:  [id number]   MEDICAL RECORD NO.:  [id number]          PATIENT TYPE:  INP   LOCATION:  2016                         FACILITY:  [facility]   PHYSICIAN:  Willa Rough, M.D.     DATE OF BIRTH:  1951/09/11   DATE OF ADMISSION:  06/12/2006  DATE OF DISCHARGE:  06/16/2006                           DISCHARGE SUMMARY - REFERRING   DISCHARGE DIAGNOSES:  1. Acute inferior myocardial infarction.  2. Status post Taxus drug-eluting stents x2 to the right coronary artery.  3. Hyperlipidemia with a trial of Lipitor.  4. Iron deficiency anemia.  5. Facial twitching of uncertain etiology.  6. Hypotension related to #1.  7. History as noted below.   PROCEDURES PERFORMED:  Emergent cardiac catheterization by Dr. Samule Ohm on  June 12, 2006 with two Taxus drug-eluting stents placed to the RCA.   BRIEF HISTORY:  [name] is a 60 year old white female who presented to  [facility] on the afternoon of admission with 8/10 chest discomfort.  EKG  showed ST-segment elevation inferiorly.  She was transferred to [facility] for  emergent catheterization.   History is notable for hypertension, hyperlipidemia, hypothyroidism,  seasonal allergies, anxiety, asthma, fibromyalgia, GERD, hiatal hernia,  osteoarthritis with DJD, status post left TKA, irritable bowel syndrome,  diverticulitis, status post cholecystectomy, question history of TIA,  negative adenosine Myoview in February 2006.   ALLERGIES:  1. SULFA.  2. CODEINE.  3. LIPITOR.  4. VYTORIN.  5. TETRACYCLINE.   LABORATORY DATA:  Admission total CK was 865 with MB of 174.4 and a relative  index 20.2, troponin 9.74.  Second CK was 1484 with an MB of 243.4, relative  index 16.4 and a troponin of 41.59.  Subsequent enzymes were declining.  Fasting lipids showed a total cholesterol 223, triglycerides 67, HDL 46, LDL  164.  TSH was 1.407.  Iron on the 11th was 29 which was low, TIBC 314, % sat  9, ferritin 30.   Admission sodium was 142, potassium 3.9, BUN 8, creatinine  0.9, glucose 83.  AST was slightly elevated at 94.  Admission H&H was 10.4  and 31.6, normal indices, platelets 384, WBCs 12.0.  Subsequent hematologies  were unremarkable.  Subsequent EKGs throughout her admission showed evolving  inferior myocardial infarction.   HOSPITAL COURSE:  Ms. [name] was initially evaluated by Christain Sacramento and  Dr. Samule Ohm.  She was taken emergently to the catheterization lab by Dr.  Samule Ohm.  This revealed 100% mid-RCA, 90% distal RCA, 90% diagonal-1, 50%  proximal ramus, 40% proximal OM-1, 30% proximal circumflex.  Utilizing two  Taxus stents that overlapped Dr. Samule Ohm opened the RCA lesion from 190% to  0%.  She was placed on low dose Lipitor, to check her tolerance.  Dr. Samule Ohm  noted that she had a history of nausea with it prior but she is agreeable to  try this.  Dopamine was continued given her hypotension.  She will need  aspirin and Plavix indefinitely.  No beta blocker at this time secondary to  hypertension.  Post sheath removal on bed rest.  Patient did not have  a  hematoma but she had substantial ecchymosis in the catheterization site.  Later that evening she complained of chest discomfort and Lavella Hammock  assessed.  EKG did not show any acute changes.  She remained on dopamine  given her hypotension, however, this had improved from cardiac  catheterization.  Nursing noted frequent complaints of indigestion, gas,  stomach discomfort, vague descriptions of numbness and tingling, chest  discomfort throughout her admission.  By the 9th her dopamine was  discontinued, her blood pressure had improved and a low dose Coreg was added  to her medical regimen.  On June 14, 2006 Lavella Hammock notes that  patient was complaining of sudden onset facial drawing, left facial  numbness, left arm numbness.  She has had prior episodes previous to her  admission associated with severe headaches.  Patient  attributed this to  TIAs.  It is not clear if she has had prior evaluations.  Neurological  consult was obtained with Dr. Pearlean Brownie on June 14, 2006.  Both he and Dr.  Samule Ohm, after review, did not feel that this was an episode of an acute  stroke as that there were no signs of objective weakness or neurological  deficit.  Neuro did note decreased sensation in left facial region and left  upper extremity.  They felt this was possibly related to spasm versus  anxiety.  They would like to do an MRI but given recent MI and stent  insertion this will not be able to be done for approximately 6-8 weeks.  They prescribed Celexa and Xanax 0.25 t.i.d.  Cardiac rehab assisted with  ambulation and education.  Medications were continued to be adjusted.  By  June 16, 2006 it was felt patient was stable to be discharged.   DISCHARGE DISPOSITION:  She was discharged home, asked to maintain a low  salt, fat, cholesterol diet.  Her activity in regards to lifting, driving,  sexual activity or heavy exertion is restricted for approximately two weeks.  Wound care will be restricted per the supplemental post catheterization  sheet.  She was asked to bring all medications to all appointments.   New prescriptions include:  1. Aspirin 325 mg daily.  2. Plavix 75 mg daily.  3. Nitroglycerin 0.4 as needed.  4. Lipitor 80 mg q.h.s.  5. Coreg 6.25 mg twice a day.  6. Celexa 10 mg daily.  7. Clonazepam 0.25 mg t.i.d.   She was asked to continue her:  1. Cozaar 150 daily.  2. Claritin 10 daily.  3. Synthroid 115 mcg daily.  4. Prilosec 20 daily.  5. Accolate 20 mg twice a day.   She was instructed not to take her Norvasc.  She will need blood work in 6-8  weeks in regards to FLP and LFTs.  She was asked to call and make a followup  appointment with neurologist, Dr. Pearlean Brownie, at his office.  She was also asked to call Dr. Melinda Crutch office to arrange a two week appointment.      Joellyn Rued, P.A. [facility]     ______________________________  Willa Rough, M.D.    EW/MEDQ  D:  06/16/2006  T:  06/17/2006  Job:  [id number]

## 2011-03-23 NOTE — Assessment & Plan Note (Signed)
[facility]                            CARDIOLOGY OFFICE NOTE   NAME:OCONNORPsalms, Olarte                      MRN:          [id number]  DATE:02/12/2007                            DOB:          May 27, 1951    PRIMARY CARE PHYSICIAN:  Dr. Oliver Barre.   HISTORY OF PRESENT ILLNESS:  Miss Biller is a 60 year old woman who  suffered inferior myocardial infarction in August 2007. I treated her  with 2 Taxus drug-eluting stents to the right coronary artery.  Ever  since then she has had continued chest discomfort. We repeated  catheterization in December 2007 which demonstrated patency of her RCA  stents. The only significant residual stenosis was in a small diagonal.  It has always been unclear whether her chest discomfort is actually due  to myocardial ischemia or not. We have been treating it as such. We had  started her on Ranexa a few weeks ago after she had had headache on  Imdur. She then presented to Dr. Antoine Poche with headache while I was  away. He stopped her Ranexa. She says her headaches are somewhat better  and she is not having much chest discomfort.   CURRENT MEDICATIONS:  1. Cozaar 100 mg daily.  2. Aculeate 20 mg twice daily.  3. Zetia 10 mg daily.  4. Prilosec 20 mg twice daily.  5. Claritin D.  6. Tylenol.  7. Coreg 18.75 mg twice daily.  8. Advair.  9. Plavix 75 mg daily.  10.Skelaxin 800 mg twice daily.  11.Levothyroxine 125 mcg daily.   PHYSICAL EXAMINATION:  She is generally well-appearing in no distress  with heart rate 84, blood pressure 128/90, weight 201 pounds.  SKIN EXAM: Remarkable for a bad sunburn on the distal portion of both  the shins.  She has no jugular venous distension or thyromegaly.  LUNGS: Clear to auscultation.  She has a non displaced point of maximal cardiac impulse. There is a  regular rate and rhythm without murmur, rub, or gallop.  ABDOMEN: Soft, nondistended, nontender. There is hepatosplenomegaly.  Bowel sounds are normal.   IMPRESSION/RECOMMENDATIONS:  1. Coronary disease, prior inferior myocardial infarction. Continue      Plavix and beta blocker. Ejection fraction normal. With her angina      well controlled we will make no changes in her medications.  2. Somatization disorder.  3. Fibromyalgia.  4. Hypertension.  5. Hypercholesterolemia, has not tolerated multiple statins. Therefore      trying Zetia.     Salvadore Farber, MD  Electronically Signed    WED/MedQ  DD: 02/13/2007  DT: 02/13/2007  Job #: [id number]   cc:   Corwin Levins, MD

## 2011-03-23 NOTE — Op Note (Signed)
[name], [name]               ACCOUNT NO.:  [id number]   MEDICAL RECORD NO.:  [id number]          PATIENT TYPE:  INP   LOCATION:  0005                         FACILITY:  [facility]   PHYSICIAN:  John L. Rendall, M.D.  DATE OF BIRTH:  02-02-1951   DATE OF PROCEDURE:  03/25/2006  DATE OF DISCHARGE:                                 OPERATIVE REPORT   INDICATION AND JUSTIFICATION FOR PROCEDURE:  Chronic medial left knee pain  with crepitation, grinding, swelling, unresponsive to conservative measures  and bone against bone on regular x-rays.   PREOPERATIVE DIAGNOSIS:  End-stage osteoarthritis, left knee with deformity.   SURGICAL PROCEDURES:  Left LCS total knee replacement with computer  navigation assistance.   POSTOPERATIVE DIAGNOSIS:  End-stage osteoarthritis, left knee with  deformity.   SURGEON:  John L. Rendall, M.D.   ASSISTANT:  Arnoldo Morale, [facility]   ANESTHESIA:  General with femoral nerve block.   JUSTIFICATION FOR COMPUTER NAVIGATION:  6 mm varus with 5 degrees flexion  deformity preop requiring release of the whole pes anserinus posterior  medial corner and semimembranosus.   PROCEDURE:  Under general anesthesia the left leg was prepared with DuraPrep  and draped as sterile field.  A sterile tourniquet is used.  Leg was wrapped  out with the Esmarch and the tourniquet is used at 350 mm.  Midline incision  is made.  The patella was everted.  The femur was sized as a medium.  Debridement was done in preparation for computer mapping.  Schanz pins were  then placed in the proximal medial tibia and distal medial femur and the  arrays were set up for computer navigation.  Femoral head was found first  the medial and lateral malleoli second.  The proximal tibia is mapped and  the distal femur are mapped all within 0.7 mm of reproducible accuracy.  Once this was complete, the first guide is used with the computer for  proximal tibial resection at a level of 8 millimeters below  the joint line.  Once this was done, the computer shows it is done within 1 degree of  anatomic alignment and the tensioner is then inserted.  At first after  excision of spurs along the medial tibia, there is still 4 mm fixed varus  and progressive releases were done until the computer and tensioner show it  comes within 0 degrees of deformity from the anatomic access.  Once this was  completed, the tensioner was then used to measure flexion gaps and once this  was completed, planning for the remainder of the procedure was carried out  using a medium femur and a size 2 tibia.  First the computer is used for  anterior and posterior flare of the distal femur within 1 degree of  anatomical axis.  The distal femoral cut was then made and then the  recessing guide cut was made.  The lamina spreader was inserted.  Remnants  of the menisci and cruciates were resected and spurs were taken off the back  of the femoral condyle.  Once this was done, the [place] were  inserted and the proximal tibia was carefully exposed.  Center hole with  keel are inserted and trial seating of a #2 tibia, 10 bearing and medium  femur are done.  These revealed excellent fit and alignment on the tibia and  femur.  The 10 mm bearing is found to give excellent varus valgus stability  and drawer test stability.  The patella was then osteotomized, three peg  holes taken.  The overall alignment is within 1 degree of anatomical axis  and within 1 degree of full extension.  The Schanz pins were then removed.  Bony surfaces were prepared with pulse irrigation.  Permanent components  were cemented in place.  Once cement was hardened, the tourniquet is let  down at an hour and 10 minutes, excess cement was removed with osteotome and  mallet.  Multiple small vessels were cauterized.  The knee is then closed in  layers with #1 Tycron, #1-0 and #2-0 Vicryl and skin clips over a medium  Hemovac drain.  A sterile  compression bandage was applied.  The patient  returned to recovery in good condition.  It should be noted that PA  assistant was there throughout the case and assisted in all phases of  computer mapping and application of the prosthesis and closure of the wound.      John L. Rendall, M.D.  Electronically Signed     JLR/MEDQ  D:  03/25/2006  T:  03/26/2006  Job:  [id number]

## 2011-03-23 NOTE — Assessment & Plan Note (Signed)
[name]                            CARDIOLOGY OFFICE NOTE   NAME:[name], [name]                      MRN:          [id number]  DATE:12/13/2006                            DOB:          01/15/1951    PRIMARY CARE PHYSICIAN:  [name] Levins, MD.   HISTORY OF PRESENT ILLNESS:  [name] is a 60 year old lady who  suffered inferior myocardial infarction in August 2007.  I had treated  her with 2 Taxus drug-eluting stent in the right coronary artery.  Her  post-infarct course was complicated by facial twitching which was felt  by neurology to represent a somatization disorder.  Re-look  catheterization a week after her myocardial infarction showed patency of  her stents and no other changes in her coronary anatomy.   Ever since then she has continued to have chest discomfort.  At times  she has described this as occurring not with exertion.  She presents  today for 1-month followup.  She says for the past 6 months she has had  multiple daily episodes of chest discomfort.  She says it comes on  fairly reproducibly with exercise such as walking up a flight of stairs.  However, it also occurs very frequently at rest. Sometimes it is in her  substernal region.  Other times it begins in the right upper chest and  radiates up the right side of her neck.  There is no associated  diaphoresis, nausea or shortness of breath.  She does take nitroglycerin  for it, though the response to the nitroglycerin is unclear.   PAST MEDICAL HISTORY:  1. Hypertension.  2. Hypercholesterolemia.  3. Anxiety disorder.  4. Fibromyalgia.  5. Hypothyroidism.  6. Asthma.  7. Somatization disorder.  8. Coronary disease, status post inferior infarct.  9. Hiatal hernia.  10.Osteoarthritis with degenerative joint disease, for which she is      status post total knee replacement on the left and hopes for a      total knee replacement on the right.  11.Irritable bowel  syndrome.  12.History of diverticulitis.  13.Status post cholecystectomy.  14.Questionable history of a transient ischemic attack.   ALLERGIES:  SULFA, LOVASTATIN - REACTION UNKNOWN.   CURRENT MEDICATIONS:  1. Coreg 6.25 mg twice per day.  2. Cozaar 100 mg per day.  3. Accolate 20 mg twice per day.  4. Zetia 10 mg per day.  5. Prilosec OTC 20 mg twice per day.  6. Advair.  7. Skelaxin 800 mg twice per day p.r.n.  8. Plavix 75 mg daily.  9. Levothyroxine 125 mcg per day.   SOCIAL HISTORY:  [place]  [place]band.  Does not work.  Two grown children.  Never smoked tobacco.  Denies alcohol and illicit drug use.  Gets essentially no exercise.   FAMILY HISTORY:  Mother is alive at age 67.  Father died at 19 of  myocardial infarction.  His first MI was at 25.  Brother died of a  questionable history of heart disease, also had diabetes.  A sister has  diabetes.   REVIEW OF SYSTEMS:  Remarkable for diffuse pains, particularly in her  right knee but also across her back and right neck and shoulder.  She  attributes these to a combination of osteoarthritis and fibromyalgia.  Review of systems is otherwise negative in detail except as above.   PHYSICAL EXAMINATION:  She is generally well-appearing, no distress,  with heart rate of 94, blood pressure of 128/86, weight of 194 pounds.  NECK:  She has no jugular venous distention, thyromegaly or  lymphadenopathy.  HEENT:  Normal.  MUSCULOSKELETAL:  Exam is normal except for some crepitus in her right  knee with motion.  SKIN:  Exam is normal.  LUNGS:  Clear to auscultation.  Respiratory effort is normal.  CARDIAC:  She has a nondisplaced point of maximal cardiac impulse.  There is a regular rate and rhythm without murmur, rub or gallop.  ABDOMEN:  Soft, nondistended and nontender.  There is no  hepatosplenomegaly.  Bowel sounds are normal.  EXTREMITIES:  Warm without clubbing, cyanosis, edema or  ulceration.  Carotid pulses are 2+ bilaterally without bruit.  Femoral pulses are 2+  bilaterally without bruit.  She is alert and oriented x3 with normal affect and normal neurologic  exam.   Electrocardiogram demonstrates normal sinus rhythm with Qs in lead III  alone.   IMPRESSION/RECOMMENDATIONS:  I find it difficult to interpret Ms.  [name]'s chest discomfort.  Obviously she has coronary disease and has  suffered an inferior myocardial infarction in the past.  However, she  also has multiple other pain syndromes.  Much of her pain is clearly  noncardiac.  I am concerned, however, by the exertional nature of some  of her pain.  After extensive discussion with she and her husband, I  have recommended proceeding to cardiac catheterization for definitive  exclusion of coronary disease as the etiology of her chest  discomfort.  In the interim we will increased her Coreg to 12.5  milligrams twice per day and make no other changes in her medications.     Salvadore Farber, MD  Electronically Signed    WED/MedQ  DD: 12/13/2006  DT: 12/14/2006  Job #: [id number]

## 2011-03-23 NOTE — Discharge Summary (Signed)
NAMEANISTON, CHRISTMAN               ACCOUNT NO.:  [id number]   MEDICAL RECORD NO.:  [id number]          PATIENT TYPE:  INP   LOCATION:  1512                         FACILITY:  [facility]   PHYSICIAN:  John L. Rendall, M.D.  DATE OF BIRTH:  08/22/1951   DATE OF ADMISSION:  03/25/2006  DATE OF DISCHARGE:  03/28/2006                                 DISCHARGE SUMMARY   ADMITTING DIAGNOSES:  Osteoarthritis left knee.   DISCHARGE DIAGNOSES:  1.  Osteoarthritis left knee.  2.  Post hemorrhagic anemia.  3.  Hypo-osmolality.  4.  Hypertension.  5.  Hypothyroidism.  6.  Asthma.   PROCEDURE:  Left total knee arthroplasty with computer navigation.   HISTORY:  A 60 year old white married female with longstanding left knee  pain for 20+ years.  She has had multiple falls over the years.  Has had  multiple surgeries also noted.  Pain is now constant and severe and wakes  her at night.  She has difficulty with ambulation.  Radiographic end-stage  DJD for left total knee arthroplasty.   HOSPITAL COURSE:  A 60 year old female admitted Mar 25, 2006 after  appropriate laboratory studies were obtained as well as 1 g Ancef IV on-call  to the operating room.  Was taken to the operating room where she underwent  a left total knee arthroplasty with computer navigation.  She tolerated the  procedure well.  She was continued with Ancef 1 g IV q.8h. x3 doses.  A  Foley was placed intraoperatively.  She was begun on Arixtra 2.5 mg  subcutaneous every 8 a.m. for seven days.  Consultations to PT, OT, and care  management were made.  Weightbearing as tolerated on the left knee.  A PCA  Dilaudid pump was used on a full dose for pain management.  She was allowed  out of bed to chair the following day.  Foley was discontinued postoperative  day one.  She was ambulated b.i.d.  She did have elevation of temperature  and a chest x-ray was ordered.  Respiratory therapy for good pulmonary  toilet was also ordered.   Albuterol inhaler bedside was ordered.  She may  use her own inhaler.  She was weaned off her PCA pump.  Maintain O2  saturations greater than 92% if chest pain and call M.D.  Home health PT,  OT, and CPM were ordered.  CTA of the lung to rule out PE was ordered.  She  was begun on OxyContin on the 23rd at 20 mg p.o. q.12h. also.  She was  instructed in Arixtra injections.  Remainder of her hospital course was  uneventful and the CTA did not show a pulmonary embolus and she was  discharged on the 24th to return back to the office in two weeks for follow-  up.  EKG showed sinus tachycardia, otherwise normal EKG.   LABORATORIES:  Hemoglobin 12.9, hematocrit 39.5%, white count 11,400,  platelets 154,000.  Discharge hemoglobin 10.2, hematocrit 30.3%, white count  12,600, platelets 429,000.  Admission pro time 12.5, INR 0.9, PTT 30.  Chemistries:  Preoperative sodium 142, potassium 3.7,  chloride 109, CO2 25,  glucose 129, BUN 8, creatinine 0.8, calcium 9.3, total protein 7, albumin  3.9, AST 20, ALT 20, ALP 74, total bilirubin 0.6.  Discharge sodium 133,  potassium 3.6, chloride 99, CO2 27, glucose 138, BUN 4, creatinine 0.7, and  calcium 8.7.  Urinalysis benign for voided urine.  Blood type is O+.  Antibody screen negative.   RADIOGRAPHIC STUDIES:  Chest x-ray of May 16 reveals no active lung disease.  Chest x-ray of May 22 reveals development of right lower lobe and minimal  right upper lobe atelectasis.  CTA revealed no evidence of pulmonary  embolus.  There is bilateral lower lobe atelectasis.   DISCHARGE INSTRUCTIONS:  There is no restriction in diet.  No driving or  lifting for six weeks.  May increase activity slowly.  Crutches or walk or  weightbearing as tolerated left leg.  May shower.  Follow blue total knee  instruction sheet.  May resume preoperative medications, but no ibuprofen.  Arixtra 2.5 mg inject subcutaneous 8 a.m. daily.  Her last dose on Apr 01, 2006.  She is then to  begin one baby aspirin a day on May 29.  Percocet  prescription 5/325 one to two tablets every four hours as needed for pain,  OxyContin 20 mg one tablet every 12 hours for pain, Phenergan 25 mg one  tablet every eight hours as needed for pain/nausea.  She will follow back up  with Dr. Priscille Kluver in two weeks from surgery.  Take her temperature four times  a day and call if greater than 101.  Use her CPM 0-90 degrees.  Discharged  improved condition.      Oris Drone Petrarca, P.A.-C.      Carlisle Beers. Rendall, M.D.  Electronically Signed    BDP/MEDQ  D:  04/16/2006  T:  04/16/2006  Job:  [id number]

## 2011-03-23 NOTE — Consult Note (Signed)
[facility]  Patient:    [name], [name] Visit Number: [id number] MRN: [id number]          Service Type: MED Location: 3W [id number] Attending Physician:  Tresa Garter Dictated by:   Marlan Palau, M.D. Proc. Date: 12/23/01 Admit Date:  12/19/2001   CC:         Sonda Primes, M.D. [facility]             [facility], [address]                          Consultation Report  HISTORY OF PRESENT ILLNESS:  [name] is a 60 year old, right-handed, white female born on [birth year], with a history of obesity, hypertension, and irritable bowel syndrome.  This patient was admitted for chest pain and chest pressure, but has a myriad of other somatic complaints that have been persistent throughout this admission.  The patient has had episodes of her eyes "rolling back."  The patient may feel thast the tongue is swollen.  She ha choking sensations.  The patient has numbness on the face bilaterally, left greater than right.  She has left greater than right body numbness.  She feels drawing in the left face.  Difficulty opening the left eye.  The patient has a sensation of weakness on all fours, claiming that the extremities will not do what she wants them to do.  The patient will have some back pain from arching the back.  She will have difficulty talking.  She feels chest pressure, chest burning, burning in the extremities, left greater than right, and tingling throughout.  The patient has some neck pain and headaches that go into the back of the head and behind the eyes.  The patient claims to have headaches for the last four weeks or so.  Neurology was asked to see this patient for further evaluation.  A CT scan of the head was unremarkable.  A carotid Doppler study was negative.  An EEG study has been ordered, but has not yet been done.  PAST MEDICAL HISTORY:  Significant for:  1. History of obesity.  2. Hypertension.  3.  Hypercholesterolemia.  4. Fibromyalgia.  5. History of asthma.  6. Irritable bowel syndrome.  7. Diverticulitis.  8. History of gastroesophageal reflux disease.  9. History of hiatal hernia. 10. Status post gallbladder resection. 11. Hysterectomy. 12. History of multiple somatic complaints as above, likely conversion     reaction.  Suspect underlying anxiety and depression. 13. History of hypothyroidism.  HABITS:  The patient does not smoke or drink.  ALLERGIES:  She states an allergy to SULFA DRUGS and CODEINE.  CURRENT MEDICATIONS: 1. Synthroid 0.1 mg daily. 2. Lipitor 20 mg a day. 3. Norvasc 10 mg a day. 4. Toprol XL 25 mg one a day, which has been stopped this admission. 5. Effexor 150 mg a day. 6. Vioxx 25 mg a day. 7. Nexium 40 mg a day.  SOCIAL HISTORY:  The patient is married.  She lives in the [place], area.  She has two children who are alive and well.  The patient apparently has been caring for one of her daughters.  FAMILY MEDICAL HISTORY:  Notable that her mother is alive and well.  Her father died with CVA and MI.  The patient has one brother with heart disease and one sister with diabetes.  REVIEW OF SYSTEMS:  Notable for multiple somatic complaints and chills.  The patient has drawing of the left face, headache in the back of the head and behind the eyes, neck stiffness, neck pain, low back pain, left arm and left leg burning greater than the right side, chest burning, blurred vision, a choking sensation, shortness of breath, difficulty with talking, and nausea. No vomiting.  No problems controlling the bowels or the bladder.  Tingling in the extremities.  The patient also complains of difficulty with walking.  PHYSICAL EXAMINATION:  VITAL SIGNS:  The blood pressure is 127/64, heart rate 98, respiratory rate 18, and temperature afebrile.  GENERAL APPEARANCE:  This patient is a moderately to markedly obese white female who is alert and  cooperative at the time of examination.  HEENT:  Head:  Atraumatic.  Eyes:  The pupils are equal, round, and reactive to light.  The disks are flat bilaterally.  NECK:  Supple.  No carotid bruits noted.  RESPIRATORY:  Examination is clear.  CARDIOVASCULAR:  A grade 1-2/6 systolic ejection murmur at the aortic area. The patient has no severe edema in the extremities.  NEUROLOGIC:  Cranial nerves as above.  Facial symmetry was seen.  The patient notes a symmetric pinprick sensation bilaterally and vibratory sensation bilaterally on the face.  The patient has full extraocular movements.  Visual fields are full.  Speech is fairly well enunciated.  At times, the patient generates choppy short speech, blurting things out.  No aphasia is noted.  The patient has at poor effort with the motor examination of the extremities, but appears otherwise to have normal strength.  The patient will not give any effort when asked to push down with the arms or legs, keeping her extremities up in the air, claiming that the extremities "wont do what she wants them to do."  The patient has fair finger-nose-finger and toe-to-finger bilaterally. The deep tendon reflexes are depressed, but symmetric.  The toes are neutral to downgoing bilaterally.  The patient has decreased pinprick, soft touch, and vibratory sensation on the left side compared to the right, but claims to have depressed sensation throughout.  The patient is able to ambulate with somewhat of a limping gait with the left leg.  Romberg negative.  No evidence of a pronator drift is seen.  LABORATORY DATA:  CPK 153, troponin I 0.01.  Vitamin B12 618, folate 11.6. RPR nonreactive.  TSH slightly high at 10.2.  The patient has a white count of 7.8, hemoglobin 12.8, hematocrit 36.8, MCV 82.0, and platelets 311. Coagulation times unremarkable.  Sodium 140, potassium 3.9, chloride 105, CO2 27, glucose 114, BUN 14, creatinine 0.9, calcium 9.9, total  protein 7.1, albumin 3.7, AST 20, ALT 16, ALP 79, total bilirubin 0.3.   The CT of the head is unremarkable.  The EKG reveals normal sinus rhythm, normal EKG, and heart rate of 74.  IMPRESSION:  History of multiple somatic complaints, likely a conversion reaction.  This patient has a very functional examination at this point.  She has no evidence of any focal neurologic deficits.  This patient claims to be under a lot of stress with caring for her daughter.  I suspect there is a significant degree of anxiety and depression underlying her current symptomatology.  Would recommend psychiatric input at this point.  PLAN: 1. Will check the EEG report when available. 2. Would not pursue any further neurologic work-up at this point. 3. Consider a psychiatric consultation. Dictated by:   Marlan Palau, M.D. Attending  Physician:  Tresa Garter DD:  12/23/01 TD:  12/23/01 Job: [id number]

## 2011-03-23 NOTE — Discharge Summary (Signed)
NAMESHAQUANNA, LYCAN NO.:  [id number]   MEDICAL RECORD NO.:  [id number]          PATIENT TYPE:  INP   LOCATION:  1536                         FACILITY:  [facility]   PHYSICIAN:  Adolph Pollack, M.D.DATE OF BIRTH:  1951-05-12   DATE OF ADMISSION:  11/04/2008  DATE OF DISCHARGE:  11/09/2008                               DISCHARGE SUMMARY   FINAL DISCHARGE DIAGNOSIS:  1. Sigmoid diverticulitis with diverticulosis.   SECONDARY DIAGNOSIS:  1. Coronary artery disease.  2. Asthma.  3. Hyperlipidemia.  4. Hypertension.  5. Hypothyroidism.  6. Peripheral artery disease.   PROCEDURES:  Laparoscopic lysis of adhesions with sigmoid colectomy,  partial cecectomy and open incisional hernia repairs November 04, 2008.   REASON FOR ADMISSION:  This is a 60 year old female who has had repeated  episodes of acute diverticulitis in the sigmoid colon region.  Complete  colonoscopy could not be done due to severe diverticulosis and a very  tortuous colon.  A CT virtual colonoscopy demonstrated a soft tissue  mass partially obstructing the sigmoid colon concerning for malignancy  and she was admitted for the above procedure.   HOSPITAL COURSE:  She underwent the procedure which she tolerated fairly  well.  She began resuming bowel activity on the second postoperative  day.  She had her diet slowly advanced to liquids and to solids.  Foley  was removed on the fourth postoperative day.  By fifth postoperative day  the wound was clean, she had a drain that was just putting out serous  output and was removed.  She is able to be discharged.   DISPOSITION:  Discharged to home November 09, 2008 in satisfactory  condition.  She will come back to the office November 12, 2008 for wound  check and staple removal.  She was given discharge instructions and pain  medication, and was told to continue all of her pre-hospitalization  medications.  She was in satisfactory condition.      Adolph Pollack, M.D.  Electronically Signed     TJR/MEDQ  D:  01/18/2009  T:  01/18/2009  Job:  [id number]   cc:   Griffith Citron, M.D.  Fax: [phone]   Noralyn Pick. Eden Emms, MD, [facility]  [address]  [address]  [place]  [place] [address]   Corwin Levins, MD  [address]  [place]  [place] [address]

## 2011-03-23 NOTE — H&P (Signed)
NAMELURA, FALOR NO.:  [id number]   MEDICAL RECORD NO.:  [id number]          PATIENT TYPE:  OIB   LOCATION:  2807                         FACILITY:  [facility]   PHYSICIAN:  Salvadore Farber, M.D. LHCDATE OF BIRTH:  1951/10/22   DATE OF ADMISSION:  06/12/2006  DATE OF DISCHARGE:                                HISTORY & PHYSICAL   PRIMARY CARE PHYSICIAN:  Corwin Levins, M.D.   PRIMARY CARDIOLOGIST:  The patient is new to [facility], seen by Dr.  Randa Evens.   PATIENT PROFILE:  A 60 year old white female without prior history of CAD  who presents with acute inferior ST-elevation MI.   PROBLEMS:  1. Acute ST-elevation myocardial infarction.  2. Hypertension.  3. Hyperlipidemia.  4. Hypothyroidism.  5. Seasonal allergies.  6. Anxiety.  7. Asthma.  8. Fibromyalgia.  9. Gastroesophageal reflux disease.  10.Hiatal hernia.  11.Osteoarthritis with degenerative joint disease status post left total      knee arthroplasty.  12.Irritable bowel syndrome.  13.Diverticulitis.  14.Status post cholecystectomy.  15.Questionable history of transient ischemic attack.  16.Negative adenosine Myoview with an ejection fraction of  86% December 29, 2004.   HISTORY OF PRESENT ILLNESS:  A 60 year old white female without prior to CAD  began to experience intermittent chest pain, waxing and waning throughout  the day yesterday, August 7.  She finally presented to the [facility] ED  this afternoon with 8/10 chest pain.  EKG revealed 3-4 mm ST-segment  elevation in leads II, III, and aVF with ST depression in I, aVL, V2 and V3.  She was transferred to [facility] catheterization  lab for emergent cardiac  catheterization.  Chest pain currently 6/10.  Onset of chest pain was  sometime yesterday. Arrival at [facility] ED was [phone].  Arrival at [facility] catheterization lab was 1513.   ALLERGIES:  SULFA, CODEINE, LIPITOR, VITORIN, TETRACYCLINE.   HOME  MEDICATIONS:  1. Claritin.  2. Cozaar 50 mg daily.  3. Norvasc 10 mg daily.  4. Synthroid 150 mcg daily.  5. Prilosec 20 mg daily.  6. Accolate 20 mg b.i.d.   FAMILY HISTORY AND SOCIAL HISTORY:  Are deferred at the time being secondary  to the patient's condition and need for emergent catheterization.   REVIEW OF SYSTEMS:  Positive chest pain, history of fibromyalgia and GERD.  All other systems reviewed and negative.   PHYSICAL EXAMINATION:  VITAL SIGNS:  She is afebrile, heart rate 100,  respirations 12, blood pressure 137/89, pulse oximetry 100% on 2 liters.  GENERAL:  Pleasant white female with complaint of chest pain.  NECK:  No bruits or JVD.  LUNGS:  Respirations regular and unlabored.  CARDIAC:  Regular S1 and S2 with a 2/6 systolic ejection murmur at the right  upper sternal border.  ABDOMEN:  Soft, nontender, nondistended.  Bowel sounds present x4.  EXTREMITIES:  Warm, dry, pink.  No clubbing, cyanosis or edema.  Dorsalis  pedis and posterior tibial pulses 2+ and equal bilaterally.   Chest x-ray is pending.   EKG shows  sinus rhythm with rate of 91, and 3-4 mm inferior ST-segment  elevation.   Lab work is pending at this time.   IMPRESSION:  1. Acute inferior ST-elevation myocardial infarction.  Patient to      catheterization lab for emergent catheterization with Dr. Samule Ohm and      Dr. Excell Seltzer.  Aspirin, statin and eventually beta blocker.  We are      going to have to hold her ARB and Norvasc for the time being secondary      to hypotension that occurred after reperfusion.  Beta blocker on hold      secondary to bradycardia following reperfusion.  2. Hypertension.  All medications currently on hold secondary to      bradycardia or hypotension requiring dopamine therapy.  3. Hyperlipidemia.  Check lipids and LFTs.  Will add a statin..  She      previously has tried Lipitor and Vitorin with intolerance to both of      those.  Will try Pravachol.  4.  Hypothyroidism.  Check TFTs, continue Synthroid.  5. Gastroesophageal reflux disease.  Continue PPI.  6. Asthma.  Not currently wheezing.  Continue home regimen.      Ok Anis, NP      Salvadore Farber, M.D. [facility]  Electronically Signed    CRB/MEDQ  D:  06/12/2006  T:  06/12/2006  Job:  [id number]

## 2011-03-23 NOTE — Assessment & Plan Note (Signed)
[facility]                            CARDIOLOGY OFFICE NOTE   NAME:[name], Doble                      MRN:          [id number]  DATE:01/31/2007                            DOB:          October 22, 1951    PRIMARY:  Dr. Oliver Barre.   REASON FOR PRESENTATION:  Evaluate the patient with chest pain and  coronary artery disease.   HISTORY OF PRESENT ILLNESS:  The patient presents for followup.  She had  her last catheterization in December 20, 2006.  She was found to have  LAD 30% stenosis.  It was a small diagonal, which had 70-80% osteal  stenosis.  There was a ramus intermediate with 30% stenosis.  Circumflex  had 30% osteal stenosis.  There was a marginal with osteal 40% stenosis.  The right coronary artery had previously been stented.  It had 30% in-  stent restenosis.  The PDA had been previously jailed by the stent.  There was a posterior left ventricular branch with 40% stenosis.  The  patient was started on Imdur.  However, she had worsening headache with  this.  She was started on Ranexa and now returns with continued  headaches.  These are worse than her chronic.  She has had some slight  improvement in her chest pain.  She has multiple somatic complaints.  She cannot get around because of her knees.  She has light-headedness.  She says her blood pressure fluctuates.   PAST MEDICAL HISTORY:  Coronary artery disease, as described.  Hypertension.  Hyperlipidemia.  Hypothyroidism.  Seasonal allergies.  Anxiety.  Asthma.  Fibromyalgia.  Gastroesophageal reflux disease.  Hiatal hernia.  Osteoarthritis.  Status post left knee arthroplasty.  Irritable bowel syndrome.  Diverticulitis.  Status post cholecystectomy.   ALLERGIES:  SULFA, CODEINE, LIPITOR, VYTORIN, TETRACYCLINE.   MEDICATIONS:  1. Tylenol.  2. Claritin.  3. Ranexa 500 mg b.i.d.  4. Coreg 12.5 mg b.i.d.  5. Prilosec over-the-counter.  6. Zetia 10 mg daily.  7. Cozaar 100 mg  daily.  8. Accolate 20 mg b.i.d.   REVIEW OF SYSTEMS:  As stated in the HPI.  Negative for other systems.   PHYSICAL EXAMINATION:  The patient is in no acute distress.  Blood pressure 170/96, heart rate 83 and regular.  Weight 201 pounds,  body mass index 45.  HEENT:  Eyelids unremarkable.  Pupils equal, round, and reactive to  light.  Fundi not visualized.  Oral mucosa unremarkable.  NECK:  No jugular venous distension.  Wave form within normal limits.  Carotid upstroke brisk and symmetric.  No bruits.  No thyromegaly.  LYMPHATICS:  No cervical, axillary, or inguinal adenopathy.  LUNGS:  Clear to auscultation bilaterally.  BACK:  No costovertebral angle tenderness.  CHEST:  Unremarkable.  HEART:  PMI not displaced or sustained, S1 and S2 within normal limits,  no S3, no S4, no clicks, rubs, murmurs.  ABDOMEN:  Obese, positive bowel sounds, normal in frequency and pitch,  no bruits, rebound, guarding.  No midline pulsatile mass, hepatomegaly,  splenomegaly.  SKIN:  No rashes, no nodules.  EXTREMITIES:  With  2+ pulses throughout, no edema, cyanosis, clubbing.  NEURO:  Oriented to person, place, and time, cranial nerves 2-12 grossly  intact, motor grossly intact throughout.   EKG:  Sinus rhythm, rate 83, axis within normal limits, intervals within  normal limits, no acute ST-T wave changes.   ASSESSMENT AND PLAN:  1. Chest discomfort.  The patient's chest discomfort is improved, but      I her headache is worse.  At this point, I think we need to      discontinue the Ranexa because headache is a common side effect.  I      am going to try to increase her Coreg to 18.75 mg b.i.d.  There is      no further intervention that needs to be planned at this point      given her recent catheterization.  2. Hypertension.  Blood pressure will be managed with adjustment to      her medications as listed.  3. Followup.  We will have her come back in a few weeks to see Dr.      Samule Ohm or  sooner if she has any acute problems.     Rollene Rotunda, MD, [facility]  Electronically Signed    JH/MedQ  DD: 01/31/2007  DT: 01/31/2007  Job #: [id number]   cc:   Salvadore Farber, MD  Corwin Levins, MD

## 2011-03-23 NOTE — Consult Note (Signed)
NAMEYOSHIKO, [name]               ACCOUNT NO.:  [id number]   MEDICAL RECORD NO.:  [id number]          PATIENT TYPE:  INP   LOCATION:  2016                         FACILITY:  [facility]   PHYSICIAN:  Pramod P. Pearlean Brownie, MD    DATE OF BIRTH:  03-31-1951   DATE OF CONSULTATION:  06/14/2006  DATE OF DISCHARGE:                                   CONSULTATION   REASON FOR REFERRAL:  Left facial twitching, numbness, and headache.   HISTORY OF PRESENT ILLNESS:  [name] is a 60 year old pleasant  Caucasian lady who was admitted with chest pain and underwent cardiac stent  a few days ago.  She stated that this morning she developed the sudden onset  of pain in the jaw as well as numbness along the jaws and both sides of the  face.  Subsequently, the numbness went down the left side of the face into  the neck and upper extremity and fingertips.  She also described a headache,  moderate intensity.  No photophobia, no nausea.  The headache has fluctuated  back and forth and she still has the numbness.  She has also noticed some  involuntary twitching of the left side of her face and her left eye has been  closing and the left face has been twitching intermittently.  The patient  admits she is under significant stress from her recent knee surgery as well  as cardiac stent, however, in the past, she has had problems with stress.  She has been on Cymbalta in the past.  She was seen by Dr. Sandria Manly in April for  similar symptoms and at that time, was found to have significant anxiety and  depression and possible conversion symptoms.  The CT scan of the head at  that time had shown only an incidental arachnoid cyst in the right frontal  region. MRI scan had not been done.  The patient states, in addition to the  above complaint, she has had some memory difficulties of late which are  bothersome.  She has also had symptoms of hole left body numbness.  She  denies history of stroke or TIA, however, she does  have vascular risk  factors of hypertension and hyperlipidemia.   PAST MEDICAL HISTORY:  Also includes anxiety, fibromyalgia, gastroesophageal  reflux disease, hiatal hernia, osteoarthritis, irritable bowel syndrome,  diverticulosis, migraines.   PAST SURGICAL HISTORY:  Hysterectomy, kidney stones, tonsillectomy,  cholecystectomy.   SOCIAL HISTORY:  [place] her [place].  She is a Futures trader.  She does not smoke or drink.   HOME MEDICATIONS:  Aspirin, Lipitor, Colace, Plavix, Lovenox, Levothyroxine  Claritin, Singulair, Protonix, Sudafed.   FAMILY HISTORY:  Not significant for any neurological problems.   PHYSICAL EXAM:  GENERAL:  Reveals a middle-aged pleasant Caucasian lady who is not in  distress.  She appears anxious.  VITAL SIGNS:  She is afebrile, pulse rate is 88 per minute and regular,  blood pressure 105/48, respiratory rate 16 per minute, saturations 86% on  room air.  HEENT:  Head is nontraumatic.  Neck is supple without  bruit.  ENT exam  unremarkable.  CARDIAC EXAM: No murmur or gallop.  LUNGS:  Clear to auscultation.  NEUROLOGICAL EXAM: She is awake, alert, oriented x3, with normal speech and  language function.  There is no aphasia or dysarthria.  Pupils equal and  reactive.  No nystagmus.  Face is symmetric.  She does have intermittent  twitching on the left side of the face with spasm barely.  The twitching  started beneath the left eye and the left nasolabial fold.  At times, the  face is quite symmetric.  She is able to look up and down quite well.  There  is no facial weakness.  She has subjective decreased sensation on both sides  of the face and then tongue.  There is no upper extremity drift.  She has  symmetric strength, tone, reflexes.  Finger-to-nose coordination is  accurate.  Gait was not tested.   DATA REVIEWED:  CT scan of the head dated September 03, 2005, shows small  arachnoid cyst on right frontal head  region.   Dr. Anne Hahn consultation note from February 2003 was reviewed.   IMPRESSION:  60 year old lady with multifocal symptoms consisting of face  and left body numbness, headache and facial twitching of unclear etiology.  She certainly has underlying significant anxiety and stress which may  contribute.  Migraine is also contributing.  Left facial twitching may  represent anxiety and stress or new onset of hemifacial spasms.   PLAN:  At the present time, I will focus on treating her anxiety and stress  by using Klonopin 0.25 three times a day and starting her on Celexa 10 mg a  day.  The patient would benefit, at some point, with having an MRI scan of  the brain, but because of a cardiac stent two days ago, it may be prudent to  wait for a week or two until the cardiologist feels it is safe to do so.  Treat her headaches with Ultram 50 to 100 mg as needed q.6h.  I had a long  discussion with the patient and her husband regarding her symptoms.  I  discussed the plan for evaluation and treatment.  I answered questions.  Kindly call for questions.           ______________________________  Sunny Schlein. Pearlean Brownie, MD     PPS/MEDQ  D:  06/14/2006  T:  06/14/2006  Job:  [id number]

## 2011-03-23 NOTE — Discharge Summary (Signed)
NAMESHANI, FITCH NO.:  [id number]   MEDICAL RECORD NO.:  [id number]            PATIENT TYPE:   LOCATION:                                 FACILITY:   PHYSICIAN:  Joellyn Rued, P.A. [facility]      DATE OF BIRTH:   DATE OF ADMISSION:  DATE OF DISCHARGE:                           DISCHARGE SUMMARY - REFERRING   ADDENDUM:  I just dictated discharge summary [phone] and I forgot at the end  of it to list a discharge time which is greater than 30 minutes.      Joellyn Rued, P.A. [facility]     EW/MEDQ  D:  06/16/2006  T:  06/17/2006  Job:  [id number]

## 2011-03-23 NOTE — Cardiovascular Report (Signed)
NAMEBETHLEHEM, [name] NO.:  [id number]   MEDICAL RECORD NO.:  [id number]          PATIENT TYPE:  INP   LOCATION:  2003                         FACILITY:  [facility]   PHYSICIAN:  [name] Pick. Nishan, MD,FACCDATE OF BIRTH:  1951/09/12   DATE OF PROCEDURE:  06/19/2006  DATE OF DISCHARGE:                              CARDIAC CATHETERIZATION   INDICATIONS:  A 60 year old patient with inferior wall MI a week ago.  Case  was complicated by Bezold-Jarisch reflex hypotension, requiring temporary  pacemaker and dopamine.   The patient had a large ecchymosis and hematoma on the right groin.  She  also had ecchymoses from Lovenox injections in both lower quadrants of her  abdomen and pannus.   We elected to use the left femoral artery and vein.   A 5-French sheath was advanced into the right femoral vein without  difficulty.  A 6-French catheter was advanced in the right femoral artery  without difficulty.   Left main coronary artery had a 20% discrete stenosis.   Left anterior descending artery had 40% tubular disease in the mid vessel.  We did give 200 mcg IC nitroglycerin since we knew she had moderate disease  from her previous cath and wanted to get the best angiographic views.   The second diagonal branch was small with an 80% ostial lesion.   There was an intermediate branch with a 40-50% ostial lesion.   The circumflex coronary artery was nondominant.  First obtuse marginal  branch had a 40% ostial lesion.   The right coronary artery was dominant.  The proximal portion was normal.  The stents in the midportion were widely patent and looked beautiful.  The  distal RCA had 30% tubular disease.  The PDA and PLA were normal.   RAO ventriculography showed persistent inferobasal wall hypokinesis as was  seen during her MI.  The EF was 50%.  There was no significant MR.   Aortic pressure is 131/77, LV pressure is 130/5 with a post A-wave EDP of  25.   IMPRESSION:   Although the patient has moderate left sided disease, this  should not be causing rest pain.  I do not think that there is any  intervention indicated in the LAD or diagonal.   The patient will have an outpatient Myoview in 2-3 months to assess this and  viability of inferior wall.  She will continue her aspirin and Plavix.   The stents in the right coronary artery are widely patent.  We will watch  her overnight, since she already has had a large hematoma on the right.  We  did Angio-Seal the right femoral artery at the end of the case with good  hemostasis.  We will use this Syvek patch for the venous sheath.   Overall, the patient tolerated procedure well with no hemodynamic  compromise.           ______________________________  [name] Pick. Eden Emms, MD,FACC     PCN/MEDQ  D:  06/19/2006  T:  06/19/2006  Job:  [id number]

## 2011-03-23 NOTE — Cardiovascular Report (Signed)
[name], BUSSER NO.:  [id number]   MEDICAL RECORD NO.:  [id number]          PATIENT TYPE:  OIB   LOCATION:  2914                         FACILITY:  [facility]   PHYSICIAN:  Salvadore Farber, M.D. LHCDATE OF BIRTH:  1951/07/02   DATE OF PROCEDURE:  06/12/2006  DATE OF DISCHARGE:                              CARDIAC CATHETERIZATION   PROCEDURES:  1. Left heart catheterization.  2. Left ventriculography.  3. Coronary angiography.  4. Drug-eluting stent placement x2 to the mid and distal right coronary      artery.  5. Temporary placement of right ventricular pacing wire.  6. AngioSeal closure of the right common femoral arteriotomy site.   INDICATIONS:  Ms. [name] is a 60 year old woman with hypertension,  hypercholesterolemia, who presents with acute inferior myocardial  infarction.  She describes pain onset as yesterday morning.  Pain has waxed  and waned but not been completely resolved in the succeeding hours.  She  finally presented to [facility] Emergency Room at 1411.  She was diagnosed  with acute inferior myocardial infarction and transferred via EMS to [facility] Cardiac Catheterization lab for percutaneous revascularization.   PROCEDURAL TECHNIQUE:  Informed consent was obtained.  Under 1% lidocaine  local anesthesia, a 6-French sheath was placed in the right common femoral  artery and a 6-French sheath in the right common femoral vein using the  modified Seldinger technique.  Diagnostic angiography and ventriculography  were performed using JL4, JR4, and pigtail catheters.  These images  demonstrated the culprit lesion to be a total occlusion at the mid right  coronary artery.  Decision was made to proceed to percutaneous  revascularization.   Anticoagulation was initiated with heparin to achieve and maintain an ACT of  greater than 200 seconds.  CHAMPION study drugs were administered.  Double  bolus eptifibatide was administered.  Had  difficulty advancing the wire  beyond the occlusion as it favored a branch just proximal to the occlusion.  I was able to advance a Whisper wire across the occlusion into the distal  right coronary.  I then opened the vessel using a 2.5 x 12 mm Maverick  balloon at 6 atmospheres for a total of 4 inflations.   With this reperfusion, the patient became hypotensive and bradycardic.  Temporary pacing wire was advanced via the venous sheath and positioned in  the RV apex.  Pacing was confirmed.  Atropine was administered. The patient  had hypotension with systolic blood pressure of approximately 60.  Dopamine  was initiated at 15 mcg/kg per minute with improvement in the blood pressure  to approximately 85 systolic.  Dopamine was continued throughout the  remainder of the case.   I then exchanged out via the balloon lumen for a Prowater wire.  I then  proceeded to stent the distal portion of the long lesion using a 2.5 x 24 mm  TAXUS stent deployed at 14 atmospheres.  I then stented the more proximal  part of the lesion using an overlapping 2.75 x 28 mm TAXUS deployed at 14  atmospheres.  I then postdilated the  distal portion of the stent using a  2.75 x 18 mm PowerSail at 16 atmospheres at the distal margin and 18  atmospheres at the region of stent overlap.  I then further dilated the  region of stent overlap using a 3.0 x 18 mm PowerSail at 18 atmospheres.  This same balloon was then used to postdilate the proximal edge of the stent  also at 18 atmospheres.  Final angiography demonstrated no residual  stenosis, no dissection, and TIMI-3 flow to the distal vasculature.   The arteriotomy was then closed using an AngioSeal device.  Complete  hemostasis was obtained.  The patient did have a small hematoma which had  occurred during retching during which the sheath backed out of the vessel  with the guide remaining intact, resulting in some blood loss around the  guide.  Further pressure was  held.   At the completion of the case, she remained on dopamine at 15 mcg/kg per  minute.  Heart rate was approximately 100 beats per minute and blood  pressure up to 100 mmHg systolic.  She was transferred to the CCU in stable  condition.   COMPLICATIONS:  Cardiogenic shock.   FINDINGS:  1. LV: 138/1/11.  EF 60% with posterior vasal akinesis  2. No aortic stenosis or mitral regurgitation.  3. Left main: Angiographically normal.  4. LAD: Moderate-size vessel giving rise to 2 diagonals.  The first      diagonal has a 90% ostial stenosis.  This is a very small vessel.  The      LAD has minor luminal irregularities along its course.  5. Ramus intermedius: Moderate-size vessel.  There is a 50% stenosis      beginning in its ostium and extending for approximately 15 mm.  6. Circumflex: Moderate-size vessel giving rise to a single obtuse      marginal.  There is a 30% stenosis at its ostium.  There is a 40%      stenosis in the proximal portion of the obtuse marginal.  7. RCA: Large, dominant vessel. There was total occlusion of the vessel      after the second acute marginal.  There is a long segment of disease      beginning with this occlusion extending distally which is treated with      2 overlapping drug-eluting stents.  There was no residual stenosis.   IMPRESSION AND PLAN:  Successful reperfusion of the occluded right coronary  artery.  The patient is in shock which appears to be vasodilatory as left  ventricular systolic function is preserved, and no major right ventricular  branches are involved in the infarction.  We will, therefore, continue  dopamine with goal to wean this overnight tonight.   She will be continued on aspirin and Plavix indefinitely given the long  segment of drug-eluting stents.  We will avoid beta blocker for the present  due to her current hypotension.  The patient states that she has a history of nausea to multiple statins.  We will Lipitor as a trial  with careful observation.      Salvadore Farber, M.D. [facility]  Electronically Signed     WED/MEDQ  D:  06/12/2006  T:  06/12/2006  Job:  [id number]   cc:   Corwin Levins, M.D. [facility]

## 2011-03-23 NOTE — Assessment & Plan Note (Signed)
[facility]                            CARDIOLOGY OFFICE NOTE   NAME:OCONNORMadell, [name]                      MRN:          [id number]  DATE:01/02/2007                            DOB:          [birth year]    PRIMARY CARE PHYSICIAN:  [name], M.D.   HISTORY OF PRESENT ILLNESS:  Ms. [name] is a 60 year old lady who  suffered an inferior myocardial infarction in August 2007.  I treated  her with two Taxus drug-eluting stents to the right coronary artery.  Post infarct course was complicated by facial twitching which neurology  felt represented a somatization disorder.   Ever since then, she has continued to have chest discomfort.  This  occurs primarily without exertion, but also sometimes with exertion.  We  took her to repeat catheterization on December 14.  That demonstrated  patency of her RCA stents.  The only significant stenosis was in a small  diagonal.  While I was not sure that this was the cause of her symptoms,  I did begin Imdur.  She thinks that may have helped with her chest  discomfort but it has caused worsening of her chronic headaches.  She  tried taking 15 mg twice daily but had persistent headaches and chest  discomfort.   Her current medications therefore are:  1. Imdur 30 mg once daily.  2. Cozaar 100 mg daily.  3. Accolate.  4. Zetia 10 mg daily.  5. Prilosec OTC.  6. Coreg 12.5 mg twice daily.  7. Advair.  8. Skelaxin 800 mg twice daily.  9. Plavix 75 mg daily.  10.Levothyroxine 125 mcg daily.   PHYSICAL EXAMINATION:  GENERAL:  She is overweight, in no distress, with  heart rate 88, blood pressure 148/88, and weight of 197 pounds.  She has  gained 17 pounds since August.  She has no jugular venous distention or  thyromegaly.  LUNGS:  Clear to auscultation.  HEART:  She has a nondisplaced point of maximal cardiac impulse.  There  is a regular rate and rhythm without murmur, rub, or gallop.  ABDOMEN:  Soft,  nontender, nondistended.  There is no  hepatosplenomegaly.  Bowel sounds are normal.  EXTREMITIES:  Warm without clubbing, cyanosis, edema or ulceration.  Femoral pulses 2+ without bruit.   Electrocardiogram demonstrates a normal sinus rhythm and is normal EKG.  Corrected QT interval is 423 milliseconds.   IMPRESSION/RECOMMENDATION:  1. Chest pain:  This may be angina.  She does think it improved with      nitrate.  Due to the headache I will stop the Imdur.  Will      substitute Ranexa 500 mg twice daily.  Follow up in 2 weeks with      electrocardiogram at that time.  2. Prior myocardial infarction:  Continue aspirin, beta blocker and      ARB.  EF is normal.  3. Somatization disorder.  4. Fibromyalgia.  5. Hypertension:  Blood pressure a bit elevated today; will reassess      when I see her in 2 weeks.  6. Hypercholesterolemia:  Continue Zetia.  Does not tolerate statins.     [name] Farber, MD  Electronically Signed    WED/MedQ  DD: 01/02/2007  DT: 01/02/2007  Job #: [id number]   cc:   [name] Levins, MD

## 2011-03-23 NOTE — Discharge Summary (Signed)
[facility]  Patient:    [name], [name] Visit Number: [id number] MRN: [id number]          Service Type: MED Location: 3W [id number] [id number] Attending Physician:  Tresa Garter Dictated by:   Corwin Levins, M.D. [facility] Admit Date:  12/19/2001 Disc. Date: 12/25/01                             Discharge Summary  DISCHARGE DIAGNOSES:  1. Psychosomatism, anxiety/depression.  2. Hypertension.  3. Morbid obesity.  4. Hypercholesterolemia.  5. Fibromyalgia.  6. History of asthma.  7. Irritable bowel syndrome.  8. Diverticulitis.  9. History of gastroesophageal reflux disease. 10. Hiatal hernia. 11. Status post cholecystectomy. 12. Status post hysterectomy. 13. History of hypothyroidism  CONSULTATIONS:  1. Neurology, Dr. Anne Hahn.  2. Psychiatry, Dr. Jeanie Sewer.  PROCEDURES:  1. Electroencephalogram within normal limits.  2. February 15 stress Cardiolite with no evidence of left ventricular     myocardial ischemia or scar.  Left ventricular ejection fraction 84%.     Normal left ventricular wall motion and thickening.  3. February 14 brain MRI with multiple small nonspecific foci of increased     signal intensity in the cerebral white matter statistically due to     incidental mild chronic small vessel disease, right parietal vertex     extra-axial arachnoid cyst consistent with chronic lesion.  No     hydrocephalus.  Partial empty cella.  4. February 14 head CT:  No intracranial abnormality.  HISTORY AND PHYSICAL:  See that dictated date of admission December 19, 2001.  HOSPITAL COURSE:  The patient is a 60 year old white female presented with initial chest pain, cold sweats, facial numbness, left facial pulling.  She was initially placed on telemetry.  Cardiac enzymes were negative.  Telemetry remained essentially normal throughout.  Multiple testing obtained as above, within normal limits; also B12, folate, VDRL, negative.  TSH approximately 10 and  Synthroid adjusted.  She was seen in consultation by neurology with essentially normal examination.  EEG followup was normal.  She remained afebrile throughout and she did have some minor rhinitis, for which she was started a nasal steroid.  Blood pressure remained controlled throughout chronic medications.  She developed no new symptoms throughout.  She was seen by psychiatry and felt that the addition of Risperdal 0.25 b.i.d. would be helpful, as well as followup with Dr. Andee Poles, whom her daughter sees and for whom she would be a new patient.  She would continue the Effexor and her other home chronic medications.  At the time of discharge, it was felt she had deemed maximum benefit from this hospitalization and is to be discharged home.  DISPOSITION:  Discharged to home in good condition.  She is to follow a low-sodium diet.  There are no activity restrictions.  DISCHARGE MEDICATIONS: 1. Synthroid 0.15 mg p.o. q.d. 2. Lipitor 20 mg p.o. q.d. 3. Norvasc 10 mg p.o. q.d. 4. Effexor XR 150 mg p.o. q.d. 5. Vioxx 25 mg p.o. q.d. 6. Nexium 40 mg p.o. q.d. 7. Risperdal 0.25 mg b.i.d. 8. Valium 2 mg t.i.d. p.r.n. 9. Avapro 150 mg p.o. q.d. which is a new medication.  FOLLOWUP:  She is to follow up with myself, Dr. Jonny Ruiz, in 2-3 weeks, as well as Dr. Nolen Mu, as above. Dictated by:   Corwin Levins, M.D. [facility] Attending Physician:  Tresa Garter DD:  12/25/01 TD:  12/25/01 Job:  [id number]

## 2011-03-23 NOTE — H&P (Signed)
NAMEMarland Kitchen  [name], [name] NO.:  [id number]   MEDICAL RECORD NO.:  [id number]          PATIENT TYPE:  EMS   LOCATION:  MAJO                         FACILITY:  [facility]   PHYSICIAN:  Jonelle Sidle, MD DATE OF BIRTH:  March 04, 1951   DATE OF ADMISSION:  06/19/2006  DATE OF DISCHARGE:                                HISTORY & PHYSICAL   PRIMARY CARDIOLOGIST:  Salvadore Farber, M.D. [facility]   PRIMARY CARE PHYSICIAN:  Corwin Levins, M.D. [facility]   REASON FOR ADMISSION:  Recurrent chest pain.   HISTORY OF PRESENT ILLNESS:  [name] is a 60 year old woman with a  history of hyperlipidemia, hypertension, anxiety, fibromyalgia,  hypothyroidism, asthma, and recently diagnosed coronary artery disease in  the setting of an acute inferior wall myocardial infarction.  She was taken  urgently to the cardiac catheterization lab by Dr. Samule Ohm on August 8, and  noted at that time to have an occluded mid right coronary artery.  She was  treated with two drug-eluting stents to the mid and distal right coronary  artery and also required temporary pacemaker placement due to bradycardia as  well as dopamine due to hypotension.  Her ejection fraction was noted to be  60% with posterior basal akinesis at the time of catheterization.  Left  system disease was noted including a very small first diagonal branch with a  90% ostial stenosis, 50% ramus intermedius, 30% circumflex, and 40% obtuse  marginal.  These areas were to be managed medically.  The patient's hospital  was notable for some recurrent episodes of nausea and indigestion with  abdominal discomfort and also chest discomfort despite no  electrocardiographic changes.  She also was noted to have some facial  twitching and was seen by neurology without clear findings of an acute  cerebrovascular event.  It was felt that this was perhaps due to anxiety or  muscle spasm and it was anticipated that an MRI scan be obtained down the  road  further away from her recent stent placement.  [name] was  discharged home on August 12, and has been compliant with her medications  since then.  She reports that she has continued to have episodes of  recurrent chest burning and tightness with some radiation to the arms and  neck and particularly noted a more severe episode this morning at 5 a.m.  that woke her from sleep.  She took three sublingual nitroglycerin tablets  over the course of 20 minutes and did not feel better.  Subsequently  referred to the emergency department by her husband.  She states that she is  still having discomfort at this point, although looks relatively  comfortable.  Her electrocardiogram does not demonstrate acute changes, in  fact showing nonspecific ST T wave changes that have improved somewhat to  her last tracing.  Initial point of care troponin I level was normal.   ALLERGIES:  SULFA DRUGS, CODEINE, STATINS including VYTORIN and possibly  LIPITOR, although she has initially tolerated a recent reinstitution of high  dose Lipitor following her event.  Also allergy to TETRACYCLINE.  DISCHARGE MEDICATIONS:  1. Aspirin 325 mg p.o. daily.  2. Plavix 75 mg p.o. daily.  3. Sublingual nitroglycerin 0.4 mg p.r.n.  4. Lipitor 80 mg p.o. q.h.s.  5. Coreg 6.25 mg p.o. b.i.d.  6. Celexa 10 mg p.o. daily.  7. Clonazepam 0.25 mg p.o. t.i.d.  8. Cozaar 150 mg p.o. daily.  9. Claritin 10 mg p.o. daily.  10.Synthroid 115 mcg p.o. daily.  11.Prilosec 20 mg p.o. daily.  12.Accolate 20 mg p.o. b.i.d.   PAST MEDICAL HISTORY:   SOCIAL HISTORY:   FAMILY HISTORY:  All reviewed and noted in the recent history and physical  from June 12, 2006.   REVIEW OF SYSTEMS:  As described in history of present illness.  She has not  had any major bleeding problems, palpitations, or syncope.  She does report  more frequent indigestion and does take a proton pump inhibitor as well as  TUMS at home.  Her husband states  that she had an endoscopy in the past, but  has never been diagnosed with any peptic ulcer disease.   PHYSICAL EXAMINATION:  VITAL SIGNS:  The patient is presently stable with  systolic blood pressure in the 120's, heart rate 70-80 in sinus rhythm,  oxygen saturation within normal limits, and respiratory rate of 18.  GENERAL:  The patient is comfortable at rest.  NECK:  No elevated jugular venous pressure or loud bruits.  No thyromegaly  is noted.  LUNGS:  Clear without labored breathing.  HEART:  Regular rate and rhythm without loud murmur, pericardial rub, or  obvious S3 gallop.  ABDOMEN:  Soft and nontender with no hepatomegaly.  Bowel sounds are  present.  EXTREMITIES:  No significant pitting edema.  Distal pulses are 2+.   LABORATORY DATA:  Hemoglobin 11.6, hematocrit 34.0, sodium 141, potassium  3.5, chloride 112, glucose 117, BUN 9, creatinine 0.7.  Point of care  troponin I less than 0.05, point of care CK-MB 1.1, LDL from recent hospital  stay 164.   Chest x-ray is pending.   IMPRESSION:  1. Recurrent chest pain as outlined above.  The patient states that this      is very similar to her presenting symptoms with her acute infarct,      although, her electrocardiogram at this point is fairly reassuring as      are her point of care cardiac markers.  She is status post recent      inferior wall myocardial infarction treated with drug-eluting stents to      the mid and distal right coronary artery and reports compliance with      her aspirin, Plavix, and remaining medications.  At this point, acute      stent thrombosis seems unlikely based on present information.  It is      certainly possible that some other process is to be considered such as      reflux, ulcer disease, or perhaps even post infarct pericarditis.  I      would say that her electrocardiogram is however not particularly     consistent with pericarditis, nor does she have pericardial rub on       examination.  2. Hypertension, controlled at this point.  3. Hyperlipidemia, placed on high dose Lipitor recently, with LDL      cholesterol of 164.  4. History of anxiety and fibromyalgia.  5. History of recent facial twitches as outlined above, and to be followed      by Pramod P.  Pearlean Brownie, M.D. of neurology with an MRI scan further down the      road.  She was placed on Celexa during her recent hospital stay.  6. History of iron deficiency anemia.   PLAN:  1. I discussed the situation with the patient and her husband.  I have      recommended admission to the hospital for further evaluation.  We will      place her in the stepdown unit and continue to follow cardiac markers.      She has already been placed on intravenous nitroglycerin and I will      also provide Zofran and morphine for both nausea and pain control.  At      this particular point I am not planning on beginning anticoagulation.      I will review the situation with Dr. Samule Ohm.  It may be that a relook      angiogram should be undertaken to exclude any major changes in her      coronary anatomy and if reassuring, consider other evaluation from      there.  An echocardiogram will also be ordered to evaluate the      pericardium and left ventricular function.  We will plan to continue      proton pump inhibitor therapy as well.  Further plans to follow.      Jonelle Sidle, MD  Electronically Signed     SGM/MEDQ  D:  06/19/2006  T:  06/19/2006  Job:  [id number]   cc:   Salvadore Farber, MD  Corwin Levins, MD

## 2011-03-23 NOTE — Discharge Summary (Signed)
NAMEKATHLEN, SAKURAI NO.:  [id number]   MEDICAL RECORD NO.:  [id number]          PATIENT TYPE:  INP   LOCATION:  2003                         FACILITY:  [facility]   PHYSICIAN:  Jonelle Sidle, MD DATE OF BIRTH:  [birth year]   DATE OF ADMISSION:  06/19/2006  DATE OF DISCHARGE:  06/20/2006                                 DISCHARGE SUMMARY   PRIMARY CARE PHYSICIAN:  Is Dr. Oliver Barre.   PRIMARY CARDIOLOGIST:  Is Dr. Randa Evens.   REASON FOR ADMISSION:  Chest pain, concerning for unstable angina pectoris.   DISCHARGE DIAGNOSES:  Chest pain, etiology unclear, likely noncardiac.    DICTATION ENDED AT THIS POINT.     ______________________________  Tereso Newcomer, PA-C      Jonelle Sidle, MD     SW/MEDQ  D:  06/20/2006  T:  06/20/2006  Job:  [id number]   cc:   Corwin Levins, MD

## 2011-03-23 NOTE — Assessment & Plan Note (Signed)
[facility]                              CARDIOLOGY OFFICE NOTE   NAME:[name], [name]                      MRN:          [id number]  DATE:07/05/2006                            DOB:          [birth year]    PRIMARY CARE PHYSICIAN:  Corwin Levins, MD   HISTORY OF PRESENT ILLNESS:  Ms. [name] is a 60 year old woman who  suffered an inferior myocardial infarction on June 12, 2006.  I placed two  Taxus drug-eluting stents in the right coronary artery.  Her post-infarction  course was complicated by facial twitching which was felt by neurology to  represent somatization disorder.  (She has had a history of this for a  number of years.)  She was re-hospitalized the week after her myocardial  infarction with similar chest discomfort.  Repeat cardiac catheterization  demonstrated patency of her stents and no other changes in her coronary  anatomy.  It was felt that her symptoms were most likely due to her  somatization disorder.   She has continued to be troubled by chest discomfort primarily at night.  It  has not responded to increased doses of her Prilosec.  She has had some  nausea and vomiting which she attributed to her Lipitor, causing her to stop  it.  She tells me she has not tolerated five other statins in the past.   Last night she had six or seven hours of ongoing 4-5/10 substernal chest  discomfort, radiating to her gums.  Resolved just an hour or two ago.  She  is currently comfortable.   CURRENT MEDICATIONS:  1. Cozaar 50 mg daily.  2. Advair.  3. Albuterol.  4. Accolate.  5. Skelaxin 800 mg b.i.d.  6. Protonix 40 mg b.i.d.  7. Plavix 75 mg daily.  8. Aspirin 325 mg daily.  9. Levothyroxine 125 mcg daily.  10.Citalopram 10 mg daily.  11.Coreg 0.625 mg b.i.d.   PHYSICAL EXAMINATION:  GENERAL:  She is well-appearing, in no distress.  VITAL SIGNS:  Heart rate 83, blood pressure 138/82, weight 180 pounds.  NECK:  She has no jugular  venous distention, thyromegaly or lymphadenopathy.  LUNGS:  Clear to auscultation.  Her respiratory effort is normal.  HEART:  She has a non-displaced point of maximal cardiac impulse.  There is  a regular rate and rhythm without murmur, rub or gallop.  ABDOMEN:  Soft, non-distended, nontender.  There is no hepatosplenomegaly.  Bowel sounds are normal.  EXTREMITIES:  Warm and without edema.   Electrocardiogram demonstrates normal sinus rhythm.  There is a Q-wave in  lead III alone.   IMPRESSION/RECOMMENDATIONS:  1. Coronary artery disease, status post inferior myocardial infarction in      early August:  I think she is doing well.  I think her symptoms are      most likely due to her somatization disorder.  This is obviously      difficult.  It is fortuitous that she has had prolonged pain last night      before coming in today.  Will check her troponin.  If the  troponin is      normal, that will effectively exclude myocardial ischemia as the      etiology of her most current chest pain.  She should continue on      aspirin and Plavix indefinitely, given the drug-eluting stent.  2. Hypertension:  Continue beta blocker at the present dose.  Increase      Cozaar to 100 mg daily.  3. Ejection fraction of 50%.  4. Somatization disorder.  5. Hypothyroidism.  6. Anxiety.  7. Fibromyalgia.  8. Gastroesophageal reflux disease.  9. Irritable bowel syndrome.  10.Hypercholesterolemia:  The patient is unwilling to take any statins.      Will try Zetia.  I let her know that this does have GI side effects in      a minority of patients.  None-the-less, I think it is worth a try.                                 Salvadore Farber, MD    WED/MedQ  DD:  07/05/2006  DT:  07/05/2006  Job #:  [id number]   cc:   Corwin Levins, MD

## 2011-03-23 NOTE — Discharge Summary (Signed)
NAMENAVIA, LINDAHL NO.:  [id number]   MEDICAL RECORD NO.:  [id number]          PATIENT TYPE:  INP   LOCATION:  2003                         FACILITY:  [facility]   PHYSICIAN:  Salvadore Farber, MD  DATE OF BIRTH:  03-23-1951   DATE OF ADMISSION:  06/19/2006  DATE OF DISCHARGE:  06/20/2006                                 DISCHARGE SUMMARY   PRIMARY CARDIOLOGIST:  Dr. Randa Evens.   PRIMARY CARE PHYSICIAN:  Dr. Oliver Barre.   REASON FOR ADMISSION:  Chest pain concerning for unstable angina pectoris.   DISCHARGE DIAGNOSES:  1. Chest pain, etiology unclear - likely noncardiac.      a.     Differential diagnosis likely gastroesophageal reflux disease       versus fibromyalgia versus somatization disorder.  2. Coronary artery disease.      a.     Status post acute inferior ST-elevation myocardial infarction       June 12, 2006, treated with TAXUS stent x2 to the mid and distal       right coronary artery - complicated by cardiogenic shock.      b.     Catheterization this admission with patent right coronary artery       stents and moderate nonobstructive disease in the left system -       medical therapy.  3. Preserved left ventricular function with an ejection fraction of 50% by      catheterization this admission.  4. Hypertension.  5. Hyperlipidemia.      a.     Lipitor recently started.  6. History of facial paresthesias.      a.     Evaluated by neurology in the past (Dr. Pearlean Brownie).  7. Treated hypothyroidism.  8. Seasonal allergies.  9. Anxiety.  10.Asthma.  11.Fibromyalgia.  12.Gastroesophageal reflux disease/hiatal hernia.  13.Osteoarthritis.  14.Irritable bowel syndrome.  15.Diverticulosis.  16.History of cholecystectomy.  17.Questionable history of transient ischemic attacks in the past.   PROCEDURE PERFORMED THIS ADMISSION:  Cardiac catheterization by Dr. [name] on June 19, 2006.  Please see his dictated note for complete  details.   Briefly:  Left main 20%, LAD 40%, mid second diagonal 80% ostial  (small), intermediate 40% to 50% ostial circumflex to OM1 40% ostial RCA  proximal mid widely patent stents, 30% distal.  LV gram basal hypokinesis.  EF 50%.   BRIEF HISTORY:  Ms. Wayne Sever is a 60 year old female who recently was  discharged after suffering an inferior ST-elevation myocardial infarction  treated with drug-eluting stents x2 to the RCA.  Her presentation was  complicated by cardiogenic shock.  Her EF at the time of her catheterization  was 60%.  She presented back to [facility] on June 19, 2006 with  complaints chest discomfort, similar to her previous myocardial infarction  pain.  She was admitted for further evaluation.  Her EGD was stable and her  pulmonary care markers were negative.  She was set up for cardiac  catheterization on the same day of admission.   HOSPITAL COURSE:  The patient underwent  cardiac catheterization by Dr.  Eden Emms as noted above.  She had patent stents in the RCA and moderate  nonobstructive disease in the left system.  This was not felt to be  contributing to her pain and medical therapy was warranted.  On the morning  of June 20, 2006, she was continuing to complain of chest discomfort.  This was somewhat similar to her acid reflux that she has had in the past as  well.  She said that Protonix works better for her than Prilosec and we will  change her to Protonix and increased her dose twice a day.  Dr. Samule Ohm saw  the patient and felt that she was stable enough for discharge to home.  It  is felt that her pain is not cardiac and may be secondary to either GERD  versus fibromyalgia versus somatization disorder.  She has been asked to  followup with her primary care physician.  She is discharged to home in  stable condition.   LABS AND ANCILLARY DATA AT DISCHARGE:  Hemoglobin 9.9, hematocrit 30.2,  white count 10,000, platelet count 450,000, sodium 141, potassium  3.3, BUN  9, creatinine 0.7, glucose 107, CK-MB negative x2.  Troponin-I 0.8, 0.11  (these are trending down since her previous MI).  Sed rate mildly elevated  at 30.  Chest x-ray, no active disease.   DISCHARGE MEDICATIONS:  1. Aspirin 325 mg daily.  2. Plavix 75 mg daily.  3. Lipitor 80 mg q.h.s.  4. Coreg 6.25 mg b.i.d.  5. Cozaar 50 mg daily.  6. Protonix 40 mg b.i.d.  7. Synthroid 0.5 mg daily.  8. Clonazepam 0.25 mg three times a day.  9. Celexa 10 mg daily.  10.Claritin 10 mg daily.  11.Accolate as taken previously.  12.Advair 250/50 b.i.d.  13.Nitroglycerin p.r.n. chest pain.   FOLLOWUP:  The patient will see Dr. Samule Ohm July 05, 2006 at 9:15 a.m.  She  has been asked to followup with Dr. Jonny Ruiz and she will him for an  appointment.   DIET:  Low-fat, low-sodium.   ACTIVITY:  She is to increase her activity slowly.  No driving, heavy  lifting or sexual activity for 2 weeks.  She may shower.  She may walk up  steps.   WOUND CARE:  She should call our office for any groin signs of bleeding,  bruising or fever.   TOTAL PHYSICIAN AND PA TIME:  Greater than 30 minutes on this discharge.     ______________________________  Tereso Newcomer, PA-C      Salvadore Farber, MD  Electronically Signed    SW/MEDQ  D:  06/20/2006  T:  06/20/2006  Job:  [id number]   cc:   Corwin Levins, MD

## 2011-03-23 NOTE — Discharge Summary (Signed)
[name], BEJAR NO.:  [id number]   MEDICAL RECORD NO.:  [id number]          PATIENT TYPE:  INP   LOCATION:  1313                         FACILITY:  [facility]   PHYSICIAN:  Adolph Pollack, M.D.DATE OF BIRTH:  06-Jul-1951   DATE OF ADMISSION:  11/29/2008  DATE OF DISCHARGE:  12/02/2008                               DISCHARGE SUMMARY   PRINCIPAL DISCHARGE DIAGNOSIS:  Deep abdominal wound infection.   SECONDARY DIAGNOSES:  1. Wound bleeding.  2. Hypertension.  3. Coronary artery disease.  4. Asthma.  5. Gastroesophageal reflux disease.  6. Hypothyroidism.  7. Fibromyalgia.  8. Obesity.   PROCEDURE:  Wound exploration and drainage, December 01, 2008.   REASON FOR ADMISSION:  This 60 year old female underwent a laparoscopic-  assisted sigmoid colectomy for recurrent sigmoid diverticulitis on  November 04, 2008.  She developed a late postoperative wound infection,  which was drained in the office on November 29, 2008.  She did well, but  late in the evening began having bleeding.  She presented to the  emergency department, was admitted, and had to have some sutures placed.  Her hemoglobin dipped as low as 6.8.  She was given 2 units of packed  red blood cells for that.   HOSPITAL COURSE:  Retention site suture wounds as well as her abdominal  wound appeared to be infected.  The wound infection was incompletely  draining.  She was taken to the operating room on December 01, 2008 for  opening of the wound and better drainage.  She felt much better  following this and was starting on dressing changes.  She had some  transient shortness of breath, and I ordered a spiral CT that was  negative for pulmonary embolism.  After this and after ambulating her  breathing improved.  She was able to be discharged on November 24, 2008.   DISPOSITION:  Discharged to home on December 02, 2008.  Arranged for home  health nursing to change the dressings in the 4 open areas.  She  will  resume her Plavix in about 4 days.  She was given Percocet for pain and  Phenergan for nausea; and given nasal cannula oxygen per home health  nurse as needed with activity.   I will see her back in the office in about 4 days.      Adolph Pollack, M.D.  Electronically Signed    TJR/MEDQ  D:  01/03/2009  T:  01/03/2009  Job:  [id number]

## 2011-03-23 NOTE — H&P (Signed)
NAMEMARGRETE, DELUDE NO.:  [id number]   MEDICAL RECORD NO.:  [id number]          PATIENT TYPE:  OIB   LOCATION:  2807                         FACILITY:  [facility]   PHYSICIAN:  Salvadore Farber, M.D. LHCDATE OF BIRTH:  02/01/1951   DATE OF ADMISSION:  06/12/2006  DATE OF DISCHARGE:                                HISTORY & PHYSICAL   ADDENDUM:  The previous Job Number was [id number].   SOCIAL HISTORY:  The patient is from [place], [place].  She lives with  her husband.  She does not work.  She has two grown children.  She has never  smoked tobacco, does not use any alcohol or drugs.  Does not routinely  exercise.   FAMILY HISTORY:  Mother is age 73, alive and well.  Father died at age 52 of  a massive MI.  His first MI was at age 58.  She has a brother who had  questionable history of heart problems and diabetes.  She also has a sister  who has diabetes.   ALLERGIES:  Please add LOVASTATIN.      Ok Anis, NP      Salvadore Farber, M.D. [facility]  Electronically Signed    CRB/MEDQ  D:  06/12/2006  T:  06/12/2006  Job:  [id number]

## 2011-03-23 NOTE — Op Note (Signed)
[facility]  Patient:    ELNORE, COSENS Visit Number: [id number] MRN: [id number]          Service Type: DSU Location: DAY Attending Physician:  Rocky Crafts Dictated by:   Michael Litter. Montez Morita, M.D. Proc. Date: 05/20/02 Admit Date:  05/20/2002 Discharge Date: 05/20/2002                             Operative Report  PREOPERATIVE DIAGNOSIS:  Rule out meniscal tear, medial, left knee.  POSTOPERATIVE DIAGNOSIS:  Medial meniscal tear with aberrant plica and medial femoral arthritis.  OPERATIVE PROCEDURE:  Partial medial meniscectomy and debridement of anteromedial plica.  SURGEON:  Philips J. Montez Morita, M.D.  DESCRIPTION OF PROCEDURE:  After suitable general anesthesia, the knee was prepped and draped routinely, put in a left holder, and upper thigh tourniquet inflated to 380 mmHg.  The tourniquet was up for about 30 minutes, and a lateral parapatellar inflow portal and the anterolateral portal for the scope, and an anteromedial portal under direct vision.  Pertinent pathology is confined strictly to the medial joint space where there is a vertical cleavage tear in the posterior horn.  It is trimmed out and a good bit of articular cartilage wear of the medial femoral condyle corresponding to a very thickened and virtually synovium plica to have out with lower meniscotome as well.  A punch forceps is used to trim back the meniscus and smoothed off with a lower meniscotome.  The lateral meniscus, ACL, and patellofemoral all looked good. Routine closure of the three puncture wounds, compression dressing, and she goes to the recovery room in good condition. Dictated by:   [employer] Montez Morita, M.D. Attending Physician:  Rocky Crafts DD:  05/20/02 TD:  05/25/02 Job: [id number]

## 2011-08-03 ENCOUNTER — Encounter: Payer: Self-pay | Admitting: Internal Medicine

## 2011-08-10 LAB — DIFFERENTIAL
Basophils Absolute: 0 10*3/uL (ref 0.0–0.1)
Eosinophils Relative: 3 % (ref 0–5)
Lymphocytes Relative: 40 % (ref 12–46)
Lymphs Abs: 2.3 10*3/uL (ref 0.7–4.0)
Monocytes Absolute: 0.3 10*3/uL (ref 0.1–1.0)
Monocytes Relative: 6 % (ref 3–12)
Neutro Abs: 3 10*3/uL (ref 1.7–7.7)

## 2011-08-10 LAB — COMPREHENSIVE METABOLIC PANEL
AST: 17 U/L (ref 0–37)
Albumin: 3.1 g/dL — ABNORMAL LOW (ref 3.5–5.2)
BUN: 7 mg/dL (ref 6–23)
Calcium: 8.6 mg/dL (ref 8.4–10.5)
Creatinine, Ser: 0.76 mg/dL (ref 0.4–1.2)
GFR calc Af Amer: >60 mL/min (ref >60)
Total Protein: 6.1 g/dL (ref 6.0–8.3)

## 2011-08-10 LAB — CBC
MCHC: 32.7 g/dL (ref 30.0–36.0)
MCV: 85.5 fL (ref 78.0–100.0)
Platelets: 331 10*3/uL (ref 150–400)
RBC: 4.31 MIL/uL (ref 3.87–5.11)

## 2011-08-10 LAB — CEA: CEA: 1 ng/mL (ref 0.0–5.0)

## 2011-08-10 LAB — TYPE AND SCREEN
ABO/RH(D): O POS
Antibody Screen: NEGATIVE

## 2011-08-15 ENCOUNTER — Inpatient Hospital Stay (HOSPITAL_COMMUNITY)
Admission: EM | Admit: 2011-08-15 | Discharge: 2011-08-21 | DRG: 282 | Disposition: A | Discharge status: Home / Self Care | Payer: Self-pay | Attending: Cardiology | Admitting: Cardiology

## 2011-08-15 ENCOUNTER — Emergency Department (HOSPITAL_COMMUNITY): Payer: Self-pay

## 2011-08-15 DIAGNOSIS — E039 Hypothyroidism, unspecified: Secondary | ICD-10-CM | POA: Diagnosis present

## 2011-08-15 DIAGNOSIS — R079 Chest pain, unspecified: Secondary | ICD-10-CM

## 2011-08-15 DIAGNOSIS — Z9861 Coronary angioplasty status: Secondary | ICD-10-CM

## 2011-08-15 DIAGNOSIS — B3731 Acute candidiasis of vulva and vagina: Secondary | ICD-10-CM | POA: Diagnosis present

## 2011-08-15 DIAGNOSIS — Z7982 Long term (current) use of aspirin: Secondary | ICD-10-CM

## 2011-08-15 DIAGNOSIS — E785 Hyperlipidemia, unspecified: Secondary | ICD-10-CM | POA: Diagnosis present

## 2011-08-15 DIAGNOSIS — I2510 Atherosclerotic heart disease of native coronary artery without angina pectoris: Secondary | ICD-10-CM

## 2011-08-15 DIAGNOSIS — Z7902 Long term (current) use of antithrombotics/antiplatelets: Secondary | ICD-10-CM

## 2011-08-15 DIAGNOSIS — I10 Essential (primary) hypertension: Secondary | ICD-10-CM | POA: Diagnosis present

## 2011-08-15 DIAGNOSIS — B373 Candidiasis of vulva and vagina: Secondary | ICD-10-CM | POA: Diagnosis present

## 2011-08-15 DIAGNOSIS — I252 Old myocardial infarction: Secondary | ICD-10-CM

## 2011-08-15 DIAGNOSIS — G4733 Obstructive sleep apnea (adult) (pediatric): Secondary | ICD-10-CM | POA: Diagnosis present

## 2011-08-15 DIAGNOSIS — I2119 ST elevation (STEMI) myocardial infarction involving other coronary artery of inferior wall: Principal | ICD-10-CM | POA: Diagnosis present

## 2011-08-15 DIAGNOSIS — J45909 Unspecified asthma, uncomplicated: Secondary | ICD-10-CM | POA: Diagnosis present

## 2011-08-15 DIAGNOSIS — IMO0001 Reserved for inherently not codable concepts without codable children: Secondary | ICD-10-CM | POA: Diagnosis present

## 2011-08-15 LAB — CBC
MCH: 28.6 pg (ref 26.0–34.0)
MCHC: 32.8 g/dL (ref 30.0–36.0)
Platelets: 363 10*3/uL (ref 150–400)
RBC: 4.69 MIL/uL (ref 3.87–5.11)

## 2011-08-15 LAB — POCT I-STAT, CHEM 8
Creatinine, Ser: 0.8 mg/dL (ref 0.50–1.10)
HCT: 42 % (ref 36.0–46.0)
Hemoglobin: 14.3 g/dL (ref 12.0–15.0)
Sodium: 136 mEq/L (ref 135–145)
TCO2: 23 mmol/L (ref 0–100)

## 2011-08-16 DIAGNOSIS — R072 Precordial pain: Secondary | ICD-10-CM

## 2011-08-16 LAB — CBC
HCT: 36.5 % (ref 36.0–46.0)
HCT: 31.4 — ABNORMAL LOW
Hemoglobin: 11.7 g/dL — ABNORMAL LOW (ref 12.0–15.0)
Hemoglobin: 11.6 — ABNORMAL LOW
MCHC: 32.1 g/dL (ref 30.0–36.0)
MCHC: 32.7
MCHC: 33.2
MCHC: 33.4
MCHC: 33.7
MCHC: 33.8
MCV: 87.1 fL (ref 78.0–100.0)
MCV: 82.9
MCV: 82.9
MCV: 83.9
MCV: 85.1
Platelets: 316
RBC: 3.18 — ABNORMAL LOW
RBC: 3.6 — ABNORMAL LOW
RBC: 3.79 — ABNORMAL LOW
RBC: 4.22
RDW: 15.7 % — ABNORMAL HIGH (ref 11.5–15.5)
RDW: 14.4 — ABNORMAL HIGH
RDW: 15.1 — ABNORMAL HIGH
WBC: 10.8 — ABNORMAL HIGH
WBC: 6.3
WBC: 9.9

## 2011-08-16 LAB — COMPREHENSIVE METABOLIC PANEL
ALT: 14
AST: 17
CO2: 27
Calcium: 9
Chloride: 105
GFR calc non Af Amer: >60
Glucose, Bld: 131 — ABNORMAL HIGH
Sodium: 141
Total Bilirubin: 0.5

## 2011-08-16 LAB — BASIC METABOLIC PANEL
BUN: 7 mg/dL (ref 6–23)
BUN: 4 — ABNORMAL LOW
BUN: 8
CO2: 24 mEq/L (ref 19–32)
CO2: 24
CO2: 25
Calcium: 8.2 — ABNORMAL LOW
Calcium: 8.2 — ABNORMAL LOW
Chloride: 101 mEq/L (ref 96–112)
Chloride: 105
Chloride: 107
Creatinine, Ser: 0.73 mg/dL (ref 0.50–1.10)
Creatinine, Ser: 0.61
Creatinine, Ser: 0.65
Creatinine, Ser: 0.67
GFR calc Af Amer: >60
GFR calc non Af Amer: >60
Glucose, Bld: 125 mg/dL — ABNORMAL HIGH (ref 70–99)
Potassium: 4.3
Sodium: 137

## 2011-08-16 LAB — CARDIAC PANEL(CRET KIN+CKTOT+MB+TROPI)
CK, MB: 1.1
Relative Index: 0.9
Total CK: 121
Total CK: 409 U/L — ABNORMAL HIGH (ref 7–177)
Troponin I: 0.02

## 2011-08-16 LAB — URINALYSIS, ROUTINE W REFLEX MICROSCOPIC
Bilirubin Urine: NEGATIVE
Glucose, UA: NEGATIVE
Hgb urine dipstick: NEGATIVE
Ketones, ur: NEGATIVE
Nitrite: NEGATIVE
Specific Gravity, Urine: 1.011
pH: 6

## 2011-08-16 LAB — POCT ACTIVATED CLOTTING TIME: Activated Clotting Time: 149 seconds

## 2011-08-16 LAB — CROSSMATCH: Antibody Screen: NEGATIVE

## 2011-08-16 LAB — DIFFERENTIAL
Basophils Absolute: 0
Basophils Relative: 0
Eosinophils Absolute: 0.1
Eosinophils Relative: 1
Lymphs Abs: 2
Neutrophils Relative %: 62

## 2011-08-16 LAB — URINE CULTURE
Colony Count: >=100000
Special Requests: NEGATIVE

## 2011-08-16 LAB — URINE MICROSCOPIC-ADD ON

## 2011-08-16 LAB — PROTIME-INR
INR: 1
Prothrombin Time: 13

## 2011-08-16 LAB — TRANSFUSION REACTION: Post RXN DAT IgG: NEGATIVE

## 2011-08-17 ENCOUNTER — Inpatient Hospital Stay (HOSPITAL_COMMUNITY): Payer: Self-pay

## 2011-08-17 DIAGNOSIS — I2119 ST elevation (STEMI) myocardial infarction involving other coronary artery of inferior wall: Secondary | ICD-10-CM

## 2011-08-17 LAB — CK TOTAL AND CKMB (NOT AT ARMC)
CK, MB: 2.3
Total CK: 84

## 2011-08-17 LAB — BASIC METABOLIC PANEL
CO2: 30
Chloride: 104
GFR calc Af Amer: >60
Potassium: 3.6
Sodium: 140

## 2011-08-17 LAB — COMPREHENSIVE METABOLIC PANEL
ALT: 12 U/L (ref 0–35)
Calcium: 8.8 mg/dL (ref 8.4–10.5)
GFR calc Af Amer: >90 mL/min (ref >90)
Glucose, Bld: 119 mg/dL — ABNORMAL HIGH (ref 70–99)
Sodium: 136 mEq/L (ref 135–145)
Total Protein: 6.7 g/dL (ref 6.0–8.3)

## 2011-08-17 LAB — CBC
HCT: 36.6 % (ref 36.0–46.0)
MCV: 88.8 fL (ref 78.0–100.0)
RBC: 4.12 MIL/uL (ref 3.87–5.11)
RDW: 16 % — ABNORMAL HIGH (ref 11.5–15.5)
WBC: 11.3 10*3/uL — ABNORMAL HIGH (ref 4.0–10.5)

## 2011-08-17 LAB — LIPID PANEL
HDL: 42 mg/dL (ref >39)
Total CHOL/HDL Ratio: 5.5 RATIO

## 2011-08-17 LAB — B-NATRIURETIC PEPTIDE (CONVERTED LAB): Pro B Natriuretic peptide (BNP): <30

## 2011-08-17 LAB — CARDIAC PANEL(CRET KIN+CKTOT+MB+TROPI): Total CK: 319 U/L — ABNORMAL HIGH (ref 7–177)

## 2011-08-17 LAB — TROPONIN I: Troponin I: 0.36 — ABNORMAL HIGH

## 2011-08-18 DIAGNOSIS — I2119 ST elevation (STEMI) myocardial infarction involving other coronary artery of inferior wall: Secondary | ICD-10-CM

## 2011-08-18 LAB — CARDIAC PANEL(CRET KIN+CKTOT+MB+TROPI)
CK, MB: 8.2 ng/mL (ref 0.3–4.0)
Relative Index: 4.7 — ABNORMAL HIGH (ref 0.0–2.5)
Total CK: 174 U/L (ref 7–177)

## 2011-08-18 LAB — BASIC METABOLIC PANEL
Calcium: 8.7 mg/dL (ref 8.4–10.5)
Creatinine, Ser: 0.8 mg/dL (ref 0.50–1.10)
GFR calc non Af Amer: 79 mL/min — ABNORMAL LOW (ref >90)
Glucose, Bld: 98 mg/dL (ref 70–99)
Sodium: 139 mEq/L (ref 135–145)

## 2011-08-19 LAB — CBC
HCT: 33.6 % — ABNORMAL LOW (ref 36.0–46.0)
MCH: 28 pg (ref 26.0–34.0)
MCV: 90.6 fL (ref 78.0–100.0)
Platelets: 256 10*3/uL (ref 150–400)
RDW: 15.5 % (ref 11.5–15.5)

## 2011-08-19 LAB — BASIC METABOLIC PANEL
BUN: 10 mg/dL (ref 6–23)
CO2: 31 mEq/L (ref 19–32)
Calcium: 8.8 mg/dL (ref 8.4–10.5)
Creatinine, Ser: 0.88 mg/dL (ref 0.50–1.10)
GFR calc Af Amer: 81 mL/min — ABNORMAL LOW (ref >90)

## 2011-08-20 LAB — CBC
HCT: 37.8
MCHC: 31.3 g/dL (ref 30.0–36.0)
MCHC: 33.2
MCV: 89.1 fL (ref 78.0–100.0)
Platelets: 324 10*3/uL (ref 150–400)
Platelets: 402 — ABNORMAL HIGH
RDW: 15.2 % (ref 11.5–15.5)
RDW: 13.9
RDW: 14.3 — ABNORMAL HIGH
WBC: 8.8 10*3/uL (ref 4.0–10.5)
WBC: 9.1

## 2011-08-20 LAB — BASIC METABOLIC PANEL
BUN: 9 mg/dL (ref 6–23)
CO2: 29
Calcium: 8.9
Chloride: 99 mEq/L (ref 96–112)
Creatinine, Ser: 0.83 mg/dL (ref 0.50–1.10)
Creatinine, Ser: 0.77
GFR calc Af Amer: 87 mL/min — ABNORMAL LOW (ref >90)
GFR calc Af Amer: >60
GFR calc non Af Amer: 75 mL/min — ABNORMAL LOW (ref >90)
GFR calc non Af Amer: >60
Glucose, Bld: 114 — ABNORMAL HIGH
Sodium: 140

## 2011-08-20 LAB — COMPREHENSIVE METABOLIC PANEL
CO2: 27
Calcium: 10.3
Creatinine, Ser: 0.71
GFR calc Af Amer: >60
GFR calc non Af Amer: >60
Glucose, Bld: 130 — ABNORMAL HIGH
Sodium: 139
Total Protein: 6.6

## 2011-08-20 LAB — D-DIMER, QUANTITATIVE: D-Dimer, Quant: 0.32

## 2011-08-20 LAB — TROPONIN I: Troponin I: 0.5

## 2011-08-20 LAB — I-STAT 8, (EC8 V) (CONVERTED LAB)
Acid-Base Excess: 3 — ABNORMAL HIGH
Bicarbonate: 27.9 — ABNORMAL HIGH
Hemoglobin: 14.3
Operator id: 277751
Potassium: 3.4 — ABNORMAL LOW
Sodium: 139
TCO2: 29

## 2011-08-20 LAB — LIPID PANEL
Cholesterol: 193
HDL: 38 — ABNORMAL LOW
HDL: 45
LDL Cholesterol: 126 — ABNORMAL HIGH
LDL Cholesterol: 150 — ABNORMAL HIGH
Total CHOL/HDL Ratio: 5.1
Triglycerides: 143
Triglycerides: 87
VLDL: 17
VLDL: 29

## 2011-08-20 LAB — HEPATIC FUNCTION PANEL
ALT: 21
AST: 19
AST: 21
Albumin: 3.2 — ABNORMAL LOW
Albumin: 3.6
Bilirubin, Direct: 0.1
Total Bilirubin: 0.4
Total Protein: 6

## 2011-08-20 LAB — DIFFERENTIAL
Basophils Absolute: 0.1
Eosinophils Relative: 2
Lymphocytes Relative: 34
Lymphs Abs: 3.1
Neutro Abs: 5.2
Neutrophils Relative %: 57

## 2011-08-20 LAB — CARDIAC PANEL(CRET KIN+CKTOT+MB+TROPI)
CK, MB: 12 — ABNORMAL HIGH
CK, MB: 8 — ABNORMAL HIGH
Relative Index: 7.1 — ABNORMAL HIGH
Total CK: 136
Troponin I: 0.82

## 2011-08-20 LAB — POCT I-STAT CREATININE
Creatinine, Ser: 0.8
Operator id: 277751

## 2011-08-20 LAB — HEMOGLOBIN A1C: Mean Plasma Glucose: 147

## 2011-08-20 LAB — POCT CARDIAC MARKERS
CKMB, poc: 10.3
Myoglobin, poc: 101
Operator id: 285491
Troponin i, poc: 0.41 — ABNORMAL HIGH

## 2011-08-20 LAB — PROTIME-INR: Prothrombin Time: 13.6

## 2011-08-20 LAB — CK TOTAL AND CKMB (NOT AT ARMC)
CK, MB: 9.8 — ABNORMAL HIGH
Total CK: 130

## 2011-08-21 NOTE — Cardiovascular Report (Signed)
NAMECHRISTIANN, [name] NO.:  [id number]  MEDICAL RECORD NO.:  [id number]  LOCATION:  2907                         FACILITY:  [facility]  PHYSICIAN:  Lorine Bears, MD     DATE OF BIRTH:  06/02/1951  DATE OF PROCEDURE:  08/15/2011 DATE OF DISCHARGE:                           CARDIAC CATHETERIZATION   PRIMARY CARDIOLOGIST:  Theron Arista C. Eden Emms, MD, [facility]  PROCEDURES PERFORMED: 1. Left heart catheterization. 2. Coronary angiography. 3. Left ventricular angiography.  INDICATION AND CLINICAL HISTORY:  This is a 60 year old female with known history of coronary artery disease status post angioplasty and 2 overlapping drug-eluting stent placement to the mid and distal right coronary artery in 2007.  She is known to have moderate disease as well in the left anterior descending artery and left circumflex on most recent cardiac catheterization in 2008.  The patient presented to the emergency room with substernal chest pain similar to her previous MI. These symptoms have been going on for about 2 days.  The patient avoided seeking medical attention due to concern about lack of health insurance. On presentation to the emergency room, she was found to have inferior Q- waves with 1-2 mm of ST elevation in the inferior leads with some reciprocal changes in the lateral leads.  Due to her presentation and EKG changes, a code STEMI was called.  STUDY DETAILS:  The right groin area was prepped in a sterile fashion. It was anesthetized with 1% lidocaine.  Right common femoral artery was accessed through the standard technique.  However, I could not pass the wire.  Under fluoroscopy, it appears that the puncture site was slightly low.  Thus, I removed the needle and held manual pressure for 2 minutes. I reaccessed the artery at the higher location without difficulty.  A 6- French sheath was placed.  Coronary angiography was performed with a JL- 4, JR-4, and pigtail catheter.  All  catheter exchanges were done over the wire.  The patient tolerated the procedure well with no immediate complications.  She was very hypertensive on presentation with blood pressure more than 200.  This was controlled with nitroglycerin drip as well as the IV labetalol.  STUDY FINDINGS:  Hemodynamic findings:  Left ventricular pressure is 159/7 with a left ventricular end-diastolic pressure of 10 mmHg.  Aortic pressure is 154/90 with a mean pressure of 117 mmHg.  Mild AV gradient is noted between the left ventricle and the aorta.  Left ventricular angiography:  This showed normal LV systolic function with mild basal inferior wall hypokinesis similar to her most recent angiography in 2008.  Estimated ejection fraction is 55%.  Coronary angiography: 1. Left main coronary artery:  The vessel is normal in size with a 20%     distal stenosis. 2. Left circumflex artery:  The vessel is normal in size and     codominant.  There is a 60% ostial lesion.  High OM-1 functioning     as a ramus and has an 80% proximal disease.  OM-2 has a 50%     proximal disease.  OM-3 is a small-sized branch and free of     significant disease.  The posterior AV groove artery  gives 2 small     posterolateral branches which are free of significant disease. 3. Left anterior descending artery.  The vessel is normal in size with     mild to moderate calcifications noted especially in the proximal     segment.  There is diffuse disease extending from the proximal LAD     to the mid LAD.  The length is at least 40-50 mm.  There is 60%     diffuse disease in this area.  In the distal LAD, there is 30-40%     discrete stenosis after the third diagonal.  The first diagonal is     a medium-sized branch with 80% ostial stenosis.  Second diagonal is     also medium in size with 70% ostial disease.  The third diagonal is     relatively small in size and free of significant disease. 4. Right coronary artery:  The vessel is  normal in size and     codominant.  There is a 40% stenosis in the proximal segment.  Two     overlap stents are noted in the mid and distal and distal segment     with 30-40% in-stent restenosis in the mid segment.  The PDA is     relatively small in size and free of significant disease.  The     posterolateral branch appears to be occluded proximally and seems     to be the culprit for ST elevation.  However, the diameter appears     to be 2 mm or less.  No collaterals are noted to that area.  STUDY CONCLUSIONS: 1. Occluded right posterolateral branch is the culprit for inferior ST-     elevation myocardial infarction.  However, due to small diameter     and relatively small territory as well as late presentation, no PCI     is recommended. 2. Borderline significant disease in the left anterior descending     artery and the left circumflex. 3. Normal LV systolic function.  RECOMMENDATIONS:  I recommend medical therapy for myocardial infarction. The patient needs blood pressure control and aggressive treatment of her risk factors.  Functional testing with stress test versus left anterior descending artery pressure wire interrogation is recommended in few weeks.  If this is significant, then consider coronary artery bypass graft surgery, mainly due to the length of the disease in the left anterior descending artery, involvement of diagonal branches as well as borderline significant disease in the left circumflex.     Lorine Bears, MD     MA/MEDQ  D:  08/15/2011  T:  08/16/2011  Job:  [id number]  Electronically Signed by Lorine Bears MD on 08/21/2011 04:41:59 PM

## 2011-08-29 NOTE — H&P (Signed)
NAMEMarland Kitchen  [name], [name] NO.:  [id number]  MEDICAL RECORD NO.:  [id number]  LOCATION:  2907                         FACILITY:  [facility]  PHYSICIAN:  Marca Ancona, MD      DATE OF BIRTH:  Jul 17, 1951  DATE OF ADMISSION:  08/15/2011 DATE OF DISCHARGE:                             HISTORY & PHYSICAL   PRIMARY CARDIOLOGIST:  Theron Arista C. Eden Emms, MD, [facility]  HISTORY PRESENT ILLNESS:  This is a 60 year old with history of coronary artery disease, status post inferior MI with drug-eluting stents x2 placed to the RCA in 2007.  She has not had Cardiology followup since 2010 due to loss of insurance.  She has a chronic pattern of chest pain that is not clearly related to exertion.  However last night, she developed severe pain across her chest radiating to both arms.  This waxed and waned until tonight when the pain became especially severe, so she came to the emergency room.  In the emergency room, the pain resolved with Lopressor x2 doses and a nitroglycerin drip.  EKG showed inferior ST-elevation with lateral ST depressions.  She has been taken to cath lab.  Systolic blood pressure initially was 220/120.  It did come down with Lopressor and nitroglycerin.  ALLERGIES:  The patient has allergies with multiple STATINS.  MEDICATIONS:  The patient is very sporadic about medications.  She took Plavix 75 mg today.  However, she has not been taking it consistently. She took aspirin 325 mg a day.  She has Coreg 25 mg twice a day but she only takes this occasionally when her blood pressure is up.  She takes Imdur, but is not sure of the dose.  She takes Levoxyl, I believe is 125 mcg daily.  PAST MEDICAL HISTORY: 1. Coronary artery disease.  The patient had an inferior MI in 2007     complicated by hypotension/bradycardia requiring dopamine and     temporary transvenous pacing.  She had drug-eluting stents x2     placed to the mid and distal RCA.  Her last left heart  catheterization was in 2008 that showed patent right coronary     artery stents and mild disease in the circumflex and LAD systems.     She had a Myoview in April 2010 that was normal. 2. Nephrolithiasis. 3. Hypertension. 4. Hypothyroidism. 5. Asthma. 6. Hemorrhoids. 7. Obstructive sleep apnea. 8. Hyperlipidemia.  The patient is intolerant of multiple statins. 9. Fibromyalgia. 10.Cholecystectomy. 11.Total abdominal hysterectomy/bilateral salpingo-oophorectomy. 12.Partial colectomy for diverticulitis in March 2010.  SOCIAL HISTORY:  The patient lives in [place] with her husband.  She is unemployed.  She does not smoke.  She does not drink alcohol.  She in fact has never smoked.  FAMILY HISTORY:  Noncontributory.  REVIEW OF SYSTEMS:  All systems were reviewed and were negative except as noted in history of present illness.  PHYSICAL EXAMINATION:  VITAL SIGNS:  The patient is afebrile, pulse initially in the 100s and regular down to the 80s with medications, blood pressure initially 220/120 decreased to 165/90 with medications, oxygen saturation 98% on room air. GENERAL:  This is an obese female in moderate distress. HEENT:  Normal exam. ABDOMEN:  Soft, nontender.  No hepatosplenomegaly.  No bowel sounds. NECK:  No thyromegaly or thyroid nodule.  No JVD. CARDIOVASCULAR:  Heart regular S1 and S2.  No S3.  No S4.  There is no murmur.  There is no peripheral edema. EXTREMITIES:  No clubbing or cyanosis. LUNGS:  Clear to auscultation bilaterally with normal respiratory effort. NEUROLOGIC:  Alert and oriented x3.  Normal affect. SKIN:  Normal exam.  EKG:  Normal sinus rhythm with acute inferior MI.  There is also ST depressions laterally.  Prior EKG showed an old inferior MI without ST elevation.  Labs are pending.  IMPRESSION:  This is a 60 year old with history of prior inferior myocardial infarction and percutaneous coronary intervention of the right coronary artery, who  presented with inferior ST elevations consistent with ST segment elevation myocardial infarction.  The patient has been taken to the cath lab.  We will control her blood pressure. She will end up needing financial assistance.     Marca Ancona, MD     DM/MEDQ  D:  08/15/2011  T:  08/16/2011  Job:  [id number]  Electronically Signed by Marca Ancona MD on 08/29/2011 08:18:27 PM

## 2011-09-12 ENCOUNTER — Encounter: Payer: Self-pay | Admitting: *Deleted

## 2011-09-12 NOTE — Discharge Summary (Signed)
[name], [name] NO.:  [id number]  MEDICAL RECORD NO.:  [id number]  LOCATION:  2019                         FACILITY:  [facility]  PHYSICIAN:  [name] Rotunda, MD, FACCDATE OF BIRTH:  04/10/1951  DATE OF ADMISSION:  08/15/2011 DATE OF DISCHARGE:  08/21/2011                              DISCHARGE SUMMARY   PROCEDURES: 1. Cardiac catheterization. 2. Coronary arteriogram. 3. Left ventriculogram. 4. A 2-D echocardiogram with contrast. 5. Portable chest x-ray.  PRIMARY FINAL DISCHARGE DIAGNOSES: 1. Inferior ST elevation myocardial infarction, medical therapy     recommended. 2. Hypertension. 3. Hyperlipidemia, intolerant to multiple statins. 4. Status post inferior myocardial infarction in 2007 complicated by     hypotension and bradycardia requiring pressors and temporary pacing     status post drug-eluting stent x2 to the right coronary artery. 5. History of nephrolithiasis. 6. Hypothyroidism. 7. Asthma. 8. Obstructive sleep apnea. 9. Fibromyalgia. 10.Status post cholecystectomy, hysterectomy, and partial colectomy     for diverticulitis in 2010.  TIME AT DISCHARGE:  36 minutes.  HOSPITAL COURSE:  [name] is a 60 year old female with a history of coronary artery disease.  She had chest pain and came to the hospital. She was having ongoing chest pain with some inferior ST elevation and Q- waves.  She was taken directly to the cath lab.  The cardiac catheterization showed the culprit lesion to be PL which was totalled but was a 2 mm vessel and medical therapy was recommended.  She had multi-vessel noncritical coronary artery disease between 30 and 80% that is also to be treated medically.  Her EF was 55% with mild basal inferior wall hypokinesis.  Dr. Kirke [name] reviewed the films and felt medical therapy was recommended.  She is encouraged to continue with aspirin and Plavix.  She was continued on beta-blocker and ARB was added to her medication  regimen. She also had a yeast infection and was put on fluconazole.  She was having problems with back pain and was on Skelaxin for that which was continued.  She was seen by cardiac rehab and had to take frequent rest breaks, but her ability to ambulate increased.  On August 21, 2011, she was evaluated by Dr. Antoine [name].  She was ambulating without chest pain or shortness of breath and her vital signs were stable.  Dr. Antoine [name] considered her stable for discharge, to follow up as an outpatient.  DISCHARGE INSTRUCTIONS:  Her activity level is to be increased gradually.  She is not to drive for 4 days and no lifting for 3 weeks. No sexual activity for a week.  She is encouraged to stick to a low- sodium, low-carbohydrate heart healthy diet.  She is to follow up with Dr. Eden [name] on November 8 at 4:30.  She is to follow up with Dr. Oliver [name] as needed.  DISCHARGE MEDICATIONS: 1. Acetaminophen 650 mg 2 tabs q.8 hours p.r.n. 2. Losartan 100 mg is discontinued. 3. Losartan 50 mg daily. 4. Coreg 25 mg b.i.d. 5. Skelaxin 800 mg t.i.d. p.r.n. 6. Pravachol 40 mg daily. 7. Imdur 60 mg daily. 8. Sublingual nitroglycerin p.r.n. 9. Aspirin 81 mg daily. 10.Plavix 75 mg daily. 11.Prilosec 20 mg 2 tabs daily  p.r.n. 12.Claritin-D is discontinued. 13.Advair 250/50, 2 puffs b.i.d. p.r.n. 14.Albuterol inhaler 2 puffs q.i.d. p.r.n. 15.Synthroid 125 mcg daily. 16.Oxymetazoline nasal spray q.i.d. p.r.n. 17.Nitroglycerin sublingual p.r.n.     [name] Demark, PA-C   ______________________________ [name] Rotunda, MD, [facility]    RB/MEDQ  D:  08/21/2011  T:  08/21/2011  Job:  [id number]  Electronically Signed by [name] Demark PA-C on 09/06/2011 03:36:48 PM Electronically Signed by [name] Rotunda MD [facility] on 09/12/2011 05:07:42 PM

## 2011-09-13 ENCOUNTER — Encounter: Payer: Self-pay | Admitting: Cardiovascular Disease

## 2011-09-13 ENCOUNTER — Ambulatory Visit (INDEPENDENT_AMBULATORY_CARE_PROVIDER_SITE_OTHER): Payer: 59 | Admitting: Cardiovascular Disease

## 2011-09-13 DIAGNOSIS — I10 Essential (primary) hypertension: Secondary | ICD-10-CM

## 2011-09-13 DIAGNOSIS — I2510 Atherosclerotic heart disease of native coronary artery without angina pectoris: Secondary | ICD-10-CM

## 2011-09-13 DIAGNOSIS — E785 Hyperlipidemia, unspecified: Secondary | ICD-10-CM

## 2011-09-13 MED ORDER — CARVEDILOL 25 MG PO TABS: 25.0000 mg | ORAL_TABLET | Freq: Two times a day (BID) | ORAL | Status: DC

## 2011-09-13 MED ORDER — PRAVASTATIN SODIUM 40 MG PO TABS: 40.0000 mg | ORAL_TABLET | Freq: Every day | ORAL | Status: DC

## 2011-09-13 MED ORDER — CLOPIDOGREL BISULFATE 75 MG PO TABS: 75.0000 mg | ORAL_TABLET | Freq: Every day | ORAL | Status: DC

## 2011-09-13 MED ORDER — ISOSORBIDE MONONITRATE ER 60 MG PO TB24: 60.0000 mg | ORAL_TABLET | Freq: Every day | ORAL | Status: DC

## 2011-09-13 MED ORDER — LOSARTAN POTASSIUM 100 MG PO TABS: 50.0000 mg | ORAL_TABLET | Freq: Every day | ORAL | Status: DC

## 2011-09-13 NOTE — Progress Notes (Signed)
F/U S/P hospital D/C 3 weeks ago.  Status post inferior myocardial infarction in 2007 complicated by hypotension and bradycardia requiring pressors and temporary pacing  status post drug-eluting stent x2 to the right coronary artery. Hospitalized 10/10 with recurrent ST elevation MI   The cardiac catheterization showed the culprit lesion to be PL which was  totalled but was a 2 mm vessel and medical therapy was recommended. She  had multi-vessel noncritical coronary artery disease between 30 and 80%  that is also to be treated medically. Her EF was 55% with mild basal  inferior wall hypokinesis. Dr. Kirke Corin reviewed the films and felt  medical therapy was recommended.  In hospital 6 days.  ARB added for BP controll.  Yeast infection resolved.  Still with no insurance.  Considering disability.  Needs refills.  Will cut statin back to qod due to myalgias.  Some dyspnea on exertion.  No typical angina.  Would have a hard time affording myovue and would like to avoid if possible  ROS: Denies fever, malais, weight loss, blurry vision, decreased visual acuity, cough, sputum, SOB, hemoptysis, pleuritic pain, palpitaitons, heartburn, abdominal pain, melena, lower extremity edema, claudication, or rash.  All other systems reviewed and negative  General: Affect appropriate Chronically ill and obese  appears stated age HEENT: normal Neck supple with no adenopathy JVP normal no bruits no thyromegaly Lungs clear with no wheezing and good diaphragmatic motion Heart:  S1/S2 no murmur,rub, gallop or click PMI normal Abdomen: benighn, BS positve, no tenderness, no AAA no bruit.  No HSM or HJR Distal pulses intact with no bruits No edema Neuro non-focal Skin warm and dry No muscular weakness S/P bilateral knee replacements   Current Outpatient Prescriptions  Medication Sig Dispense Refill  . carvedilol (COREG) 25 MG tablet Take 25 mg by mouth 2 (two) times daily with a meal.        . clopidogrel  (PLAVIX) 75 MG tablet Take 75 mg by mouth daily.        . isosorbide mononitrate (IMDUR) 60 MG 24 hr tablet Take 60 mg by mouth daily.        Marland Kitchen levothyroxine (SYNTHROID, LEVOTHROID) 125 MCG tablet Take 125 mcg by mouth daily.        Marland Kitchen losartan (COZAAR) 100 MG tablet Take 50 mg by mouth daily.        . metaxalone (SKELAXIN) 800 MG tablet Take 800 mg by mouth as needed.        . nitroGLYCERIN (NITROSTAT) 0.4 MG SL tablet Place 0.4 mg under the tongue every 5 (five) minutes as needed.        Marland Kitchen omeprazole (PRILOSEC) 20 MG capsule Take 20 mg by mouth 2 (two) times daily.        . pravastatin (PRAVACHOL) 40 MG tablet Take 40 mg by mouth daily.          Allergies  Sulfonamide derivatives  Electrocardiogram:  08/20/11  SR 72 old IMI  Assessment and Plan

## 2011-09-13 NOTE — Patient Instructions (Signed)
Your physician recommends that you schedule a follow-up appointment in: 3 months.  

## 2011-09-13 NOTE — Assessment & Plan Note (Signed)
Well controlled.  Continue current medications and low sodium Dash type diet.    

## 2011-09-13 NOTE — Assessment & Plan Note (Signed)
Continue medical Rx  No myovue to check ischemic burden due to lack of insurance

## 2011-09-13 NOTE — Assessment & Plan Note (Signed)
Cholesterol is at goal.  Continue current dose of statin and diet Rx.  No myalgias or side effects.  F/U  LFT's in 6 months. Lab Results  Component Value Date   LDLCALC 147* 08/17/2011   Continue as much statin as she will tolerate

## 2011-09-14 ENCOUNTER — Other Ambulatory Visit: Payer: Self-pay | Admitting: Internal Medicine

## 2011-10-31 ENCOUNTER — Other Ambulatory Visit: Payer: Self-pay | Admitting: Internal Medicine

## 2011-10-31 NOTE — Telephone Encounter (Signed)
Done per emr 

## 2011-10-31 NOTE — Telephone Encounter (Signed)
Levothyroxine request--last OV 10.24.11, no future appt scheduled [only w/Cardio in 02.2013]. Please advise.

## 2011-11-29 ENCOUNTER — Other Ambulatory Visit: Payer: Self-pay | Admitting: Internal Medicine

## 2011-12-14 ENCOUNTER — Ambulatory Visit: Payer: 59 | Admitting: Cardiovascular Disease

## 2012-01-30 ENCOUNTER — Other Ambulatory Visit: Payer: Self-pay

## 2012-01-30 ENCOUNTER — Emergency Department (HOSPITAL_COMMUNITY): Payer: Self-pay

## 2012-01-30 ENCOUNTER — Encounter (HOSPITAL_COMMUNITY)
Admission: EM | Disposition: A | Discharge status: Home / Self Care | Payer: Self-pay | Source: Home / Self Care | Attending: Thoracic Surgery (Cardiothoracic Vascular Surgery)

## 2012-01-30 ENCOUNTER — Inpatient Hospital Stay (HOSPITAL_COMMUNITY)
Admission: EM | Admit: 2012-01-30 | Discharge: 2012-02-10 | DRG: 234 | Disposition: A | Discharge status: Home / Self Care | Payer: 59 | Attending: Thoracic Surgery (Cardiothoracic Vascular Surgery) | Admitting: Thoracic Surgery (Cardiothoracic Vascular Surgery)

## 2012-01-30 ENCOUNTER — Encounter (HOSPITAL_COMMUNITY): Payer: Self-pay | Admitting: Emergency Medicine

## 2012-01-30 ENCOUNTER — Inpatient Hospital Stay (HOSPITAL_COMMUNITY): Payer: Self-pay

## 2012-01-30 DIAGNOSIS — E669 Obesity, unspecified: Secondary | ICD-10-CM | POA: Diagnosis present

## 2012-01-30 DIAGNOSIS — I2510 Atherosclerotic heart disease of native coronary artery without angina pectoris: Secondary | ICD-10-CM

## 2012-01-30 DIAGNOSIS — I10 Essential (primary) hypertension: Secondary | ICD-10-CM | POA: Insufficient documentation

## 2012-01-30 DIAGNOSIS — E785 Hyperlipidemia, unspecified: Secondary | ICD-10-CM | POA: Diagnosis present

## 2012-01-30 DIAGNOSIS — I214 Non-ST elevation (NSTEMI) myocardial infarction: Secondary | ICD-10-CM

## 2012-01-30 DIAGNOSIS — Z96659 Presence of unspecified artificial knee joint: Secondary | ICD-10-CM

## 2012-01-30 DIAGNOSIS — Z6841 Body Mass Index (BMI) 40.0 and over, adult: Secondary | ICD-10-CM

## 2012-01-30 DIAGNOSIS — N289 Disorder of kidney and ureter, unspecified: Secondary | ICD-10-CM | POA: Diagnosis present

## 2012-01-30 DIAGNOSIS — Z9861 Coronary angioplasty status: Secondary | ICD-10-CM

## 2012-01-30 DIAGNOSIS — J4489 Other specified chronic obstructive pulmonary disease: Secondary | ICD-10-CM | POA: Diagnosis present

## 2012-01-30 DIAGNOSIS — I252 Old myocardial infarction: Secondary | ICD-10-CM

## 2012-01-30 DIAGNOSIS — Z951 Presence of aortocoronary bypass graft: Secondary | ICD-10-CM

## 2012-01-30 DIAGNOSIS — F411 Generalized anxiety disorder: Secondary | ICD-10-CM | POA: Diagnosis present

## 2012-01-30 DIAGNOSIS — K219 Gastro-esophageal reflux disease without esophagitis: Secondary | ICD-10-CM | POA: Diagnosis present

## 2012-01-30 DIAGNOSIS — IMO0001 Reserved for inherently not codable concepts without codable children: Secondary | ICD-10-CM | POA: Diagnosis present

## 2012-01-30 DIAGNOSIS — E876 Hypokalemia: Secondary | ICD-10-CM | POA: Diagnosis present

## 2012-01-30 DIAGNOSIS — M1990 Unspecified osteoarthritis, unspecified site: Secondary | ICD-10-CM | POA: Diagnosis present

## 2012-01-30 DIAGNOSIS — I359 Nonrheumatic aortic valve disorder, unspecified: Secondary | ICD-10-CM

## 2012-01-30 DIAGNOSIS — G4733 Obstructive sleep apnea (adult) (pediatric): Secondary | ICD-10-CM | POA: Diagnosis present

## 2012-01-30 DIAGNOSIS — J449 Chronic obstructive pulmonary disease, unspecified: Secondary | ICD-10-CM | POA: Diagnosis present

## 2012-01-30 DIAGNOSIS — E039 Hypothyroidism, unspecified: Secondary | ICD-10-CM | POA: Diagnosis present

## 2012-01-30 DIAGNOSIS — D509 Iron deficiency anemia, unspecified: Secondary | ICD-10-CM | POA: Diagnosis present

## 2012-01-30 DIAGNOSIS — I249 Acute ischemic heart disease, unspecified: Secondary | ICD-10-CM

## 2012-01-30 HISTORY — DX: Cerebral infarction, unspecified: I63.9

## 2012-01-30 HISTORY — PX: LEFT HEART CATHETERIZATION WITH CORONARY ANGIOGRAM: SHX5451

## 2012-01-30 LAB — APTT: aPTT: 34 s (ref 24–37)

## 2012-01-30 LAB — DIFFERENTIAL
Basophils Absolute: 0 10*3/uL (ref 0.0–0.1)
Basophils Absolute: 0 10*3/uL (ref 0.0–0.1)
Basophils Relative: 0 % (ref 0–1)
Basophils Relative: 1 % (ref 0–1)
Eosinophils Absolute: 0.3 10*3/uL (ref 0.0–0.7)
Eosinophils Absolute: 0.2 10*3/uL (ref 0.0–0.7)
Eosinophils Relative: 3 % (ref 0–5)
Eosinophils Relative: 2 % (ref 0–5)
Lymphocytes Relative: 41 % (ref 12–46)
Lymphs Abs: 4.5 10*3/uL — ABNORMAL HIGH (ref 0.7–4.0)
Monocytes Absolute: 0.9 10*3/uL (ref 0.1–1.0)
Monocytes Absolute: 0.4 10*3/uL (ref 0.1–1.0)
Monocytes Relative: 8 % (ref 3–12)
Neutro Abs: 5.4 K/uL (ref 1.7–7.7)
Neutro Abs: 4.4 10*3/uL (ref 1.7–7.7)
Neutrophils Relative %: 48 % (ref 43–77)

## 2012-01-30 LAB — CBC
HCT: 39.7 % (ref 36.0–46.0)
HCT: 38.2 % (ref 36.0–46.0)
Hemoglobin: 12.7 g/dL (ref 12.0–15.0)
MCH: 28 pg (ref 26.0–34.0)
MCHC: 32 g/dL (ref 30.0–36.0)
MCHC: 32.2 g/dL (ref 30.0–36.0)
MCV: 87.4 fL (ref 78.0–100.0)
MCV: 88 fL (ref 78.0–100.0)
Platelets: 355 K/uL (ref 150–400)
RBC: 4.54 MIL/uL (ref 3.87–5.11)
RDW: 14.8 % (ref 11.5–15.5)
RDW: 14.8 % (ref 11.5–15.5)
WBC: 11.1 10*3/uL — ABNORMAL HIGH (ref 4.0–10.5)

## 2012-01-30 LAB — BASIC METABOLIC PANEL WITH GFR
CO2: 28 meq/L (ref 19–32)
Calcium: 9.9 mg/dL (ref 8.4–10.5)
Chloride: 100 meq/L (ref 96–112)
Creatinine, Ser: 0.85 mg/dL (ref 0.50–1.10)
Glucose, Bld: 124 mg/dL — ABNORMAL HIGH (ref 70–99)

## 2012-01-30 LAB — COMPREHENSIVE METABOLIC PANEL
Albumin: 3.5 g/dL (ref 3.5–5.2)
BUN: 11 mg/dL (ref 6–23)
Calcium: 10.2 mg/dL (ref 8.4–10.5)
Creatinine, Ser: 0.87 mg/dL (ref 0.50–1.10)
Total Protein: 6.9 g/dL (ref 6.0–8.3)

## 2012-01-30 LAB — PULMONARY FUNCTION TEST

## 2012-01-30 LAB — PROTIME-INR
INR: 1.01 (ref 0.00–1.49)
INR: 1.06 (ref 0.00–1.49)
Prothrombin Time: 13.5 s (ref 11.6–15.2)

## 2012-01-30 LAB — POCT I-STAT TROPONIN I: Troponin i, poc: 0.33 ng/mL (ref 0.00–0.08)

## 2012-01-30 LAB — BASIC METABOLIC PANEL
BUN: 12 mg/dL (ref 6–23)
GFR calc Af Amer: 85 mL/min — ABNORMAL LOW (ref >90)
GFR calc non Af Amer: 73 mL/min — ABNORMAL LOW (ref >90)
Potassium: 3.8 mEq/L (ref 3.5–5.1)
Sodium: 139 mEq/L (ref 135–145)

## 2012-01-30 LAB — HEPARIN LEVEL (UNFRACTIONATED): Heparin Unfractionated: 0.13 IU/mL — ABNORMAL LOW (ref 0.30–0.70)

## 2012-01-30 LAB — CARDIAC PANEL(CRET KIN+CKTOT+MB+TROPI)
CK, MB: 5.9 ng/mL — ABNORMAL HIGH (ref 0.3–4.0)
Troponin I: 0.72 ng/mL (ref <0.30)
Troponin I: 0.66 ng/mL (ref <0.30)
Troponin I: 0.58 ng/mL (ref <0.30)

## 2012-01-30 LAB — PLATELET INHIBITION P2Y12: Platelet Function  P2Y12: 287 [PRU] (ref 194–418)

## 2012-01-30 LAB — MAGNESIUM: Magnesium: 1.7 mg/dL (ref 1.5–2.5)

## 2012-01-30 SURGERY — LEFT HEART CATHETERIZATION WITH CORONARY ANGIOGRAM
Anesthesia: LOCAL

## 2012-01-30 MED ORDER — ONDANSETRON HCL 4 MG/2ML IJ SOLN
4.0000 mg | Freq: Once | INTRAMUSCULAR | Status: AC
  Administered 2012-01-30: 4 mg via INTRAVENOUS
  Filled 2012-01-30: qty 2

## 2012-01-30 MED ORDER — NITROGLYCERIN IN D5W 200-5 MCG/ML-% IV SOLN
2.0000 ug/min | INTRAVENOUS | Status: DC
  Administered 2012-01-30: 25 ug/min via INTRAVENOUS
  Administered 2012-02-01 – 2012-02-03 (×2): 20 ug/min via INTRAVENOUS
  Administered 2012-02-04: 40 ug/min via INTRAVENOUS
  Filled 2012-01-30 (×4): qty 250

## 2012-01-30 MED ORDER — CARVEDILOL 25 MG PO TABS
25.0000 mg | ORAL_TABLET | Freq: Two times a day (BID) | ORAL | Status: DC
  Administered 2012-01-30 – 2012-02-03 (×10): 25 mg via ORAL
  Filled 2012-01-30 (×13): qty 1

## 2012-01-30 MED ORDER — ASPIRIN 81 MG PO CHEW
81.0000 mg | CHEWABLE_TABLET | Freq: Every day | ORAL | Status: DC
  Filled 2012-01-30: qty 1

## 2012-01-30 MED ORDER — FENTANYL CITRATE 0.05 MG/ML IJ SOLN
INTRAMUSCULAR | Status: AC
  Filled 2012-01-30: qty 2

## 2012-01-30 MED ORDER — SODIUM CHLORIDE 0.9 % IJ SOLN: 3.0000 mL | INTRAMUSCULAR | Status: DC | PRN

## 2012-01-30 MED ORDER — FLUTICASONE-SALMETEROL 250-50 MCG/DOSE IN AEPB
1.0000 | INHALATION_SPRAY | Freq: Two times a day (BID) | RESPIRATORY_TRACT | Status: DC
  Administered 2012-01-30 – 2012-02-03 (×10): 1 via RESPIRATORY_TRACT
  Filled 2012-01-30: qty 14

## 2012-01-30 MED ORDER — ACETAMINOPHEN 325 MG PO TABS
650.0000 mg | ORAL_TABLET | ORAL | Status: DC | PRN
  Administered 2012-01-30 – 2012-02-01 (×4): 650 mg via ORAL
  Filled 2012-01-30 (×3): qty 2

## 2012-01-30 MED ORDER — SODIUM CHLORIDE 0.9 % IV SOLN: INTRAVENOUS | Status: DC

## 2012-01-30 MED ORDER — DIAZEPAM 5 MG PO TABS: 5.0000 mg | ORAL_TABLET | ORAL | Status: DC

## 2012-01-30 MED ORDER — ASPIRIN 81 MG PO CHEW: 324.0000 mg | CHEWABLE_TABLET | ORAL | Status: DC

## 2012-01-30 MED ORDER — ONDANSETRON HCL 4 MG/2ML IJ SOLN
4.0000 mg | Freq: Four times a day (QID) | INTRAMUSCULAR | Status: DC | PRN
  Administered 2012-01-30 – 2012-02-03 (×8): 4 mg via INTRAVENOUS
  Filled 2012-01-30 (×7): qty 2

## 2012-01-30 MED ORDER — ONDANSETRON HCL 4 MG/2ML IJ SOLN
4.0000 mg | Freq: Four times a day (QID) | INTRAMUSCULAR | Status: DC | PRN
  Administered 2012-02-01 – 2012-02-03 (×4): 4 mg via INTRAVENOUS
  Filled 2012-01-30 (×5): qty 2

## 2012-01-30 MED ORDER — ACETAMINOPHEN 325 MG PO TABS
650.0000 mg | ORAL_TABLET | ORAL | Status: DC | PRN
  Filled 2012-01-30: qty 2

## 2012-01-30 MED ORDER — HEPARIN (PORCINE) IN NACL 100-0.45 UNIT/ML-% IJ SOLN
1400.0000 [IU]/h | INTRAMUSCULAR | Status: DC
  Administered 2012-01-30: 1100 [IU]/h via INTRAVENOUS
  Administered 2012-02-01 – 2012-02-03 (×4): 1400 [IU]/h via INTRAVENOUS
  Filled 2012-01-30 (×12): qty 250

## 2012-01-30 MED ORDER — MORPHINE SULFATE 2 MG/ML IJ SOLN
2.0000 mg | Freq: Once | INTRAMUSCULAR | Status: AC
  Administered 2012-01-30: 2 mg via INTRAVENOUS
  Filled 2012-01-30: qty 1

## 2012-01-30 MED ORDER — MIDAZOLAM HCL 2 MG/2ML IJ SOLN
INTRAMUSCULAR | Status: AC
  Filled 2012-01-30: qty 2

## 2012-01-30 MED ORDER — ALBUTEROL SULFATE HFA 108 (90 BASE) MCG/ACT IN AERS
2.0000 | INHALATION_SPRAY | Freq: Four times a day (QID) | RESPIRATORY_TRACT | Status: DC | PRN
  Filled 2012-01-30: qty 6.7

## 2012-01-30 MED ORDER — HEPARIN BOLUS VIA INFUSION
4000.0000 [IU] | Freq: Once | INTRAVENOUS | Status: AC
  Administered 2012-01-30: 4000 [IU] via INTRAVENOUS

## 2012-01-30 MED ORDER — ASPIRIN 81 MG PO CHEW
162.0000 mg | CHEWABLE_TABLET | Freq: Once | ORAL | Status: AC
  Administered 2012-01-30: 162 mg via ORAL
  Filled 2012-01-30: qty 2

## 2012-01-30 MED ORDER — SODIUM CHLORIDE 0.9 % IV SOLN: INTRAVENOUS | Status: DC | PRN

## 2012-01-30 MED ORDER — NITROGLYCERIN 0.4 MG SL SUBL: 0.4000 mg | SUBLINGUAL_TABLET | SUBLINGUAL | Status: DC | PRN

## 2012-01-30 MED ORDER — NITROGLYCERIN 0.2 MG/ML ON CALL CATH LAB
INTRAVENOUS | Status: AC
  Filled 2012-01-30: qty 1

## 2012-01-30 MED ORDER — ASPIRIN EC 81 MG PO TBEC
81.0000 mg | DELAYED_RELEASE_TABLET | Freq: Every day | ORAL | Status: DC
  Filled 2012-01-30: qty 1

## 2012-01-30 MED ORDER — LOSARTAN POTASSIUM 50 MG PO TABS
50.0000 mg | ORAL_TABLET | Freq: Every day | ORAL | Status: DC
  Administered 2012-01-30 – 2012-02-03 (×5): 50 mg via ORAL
  Filled 2012-01-30 (×6): qty 1

## 2012-01-30 MED ORDER — LEVOTHYROXINE SODIUM 125 MCG PO TABS
125.0000 ug | ORAL_TABLET | Freq: Every day | ORAL | Status: DC
  Administered 2012-01-30 – 2012-02-03 (×5): 125 ug via ORAL
  Filled 2012-01-30 (×8): qty 1

## 2012-01-30 MED ORDER — SODIUM CHLORIDE 0.9 % IJ SOLN: 3.0000 mL | Freq: Two times a day (BID) | INTRAMUSCULAR | Status: DC

## 2012-01-30 MED ORDER — LIDOCAINE HCL (PF) 1 % IJ SOLN
INTRAMUSCULAR | Status: AC
  Filled 2012-01-30: qty 30

## 2012-01-30 MED ORDER — SIMVASTATIN 20 MG PO TABS
20.0000 mg | ORAL_TABLET | Freq: Every day | ORAL | Status: DC
  Administered 2012-01-30 – 2012-02-02 (×4): 20 mg via ORAL
  Filled 2012-01-30 (×5): qty 1

## 2012-01-30 MED ORDER — PANTOPRAZOLE SODIUM 40 MG PO TBEC
80.0000 mg | DELAYED_RELEASE_TABLET | Freq: Every day | ORAL | Status: DC
  Administered 2012-01-30 – 2012-02-03 (×5): 80 mg via ORAL
  Filled 2012-01-30: qty 1
  Filled 2012-01-30: qty 2
  Filled 2012-01-30: qty 1
  Filled 2012-01-30: qty 2
  Filled 2012-01-30: qty 1
  Filled 2012-01-30: qty 2

## 2012-01-30 MED ORDER — ASPIRIN 300 MG RE SUPP
300.0000 mg | RECTAL | Status: AC
  Filled 2012-01-30: qty 1

## 2012-01-30 MED ORDER — METOPROLOL TARTRATE 1 MG/ML IV SOLN: 2.5000 mg | Freq: Once | INTRAVENOUS | Status: DC

## 2012-01-30 MED ORDER — METOPROLOL TARTRATE 1 MG/ML IV SOLN
INTRAVENOUS | Status: AC
  Administered 2012-01-30: 2.5 mg
  Filled 2012-01-30: qty 5

## 2012-01-30 MED ORDER — MORPHINE SULFATE 2 MG/ML IJ SOLN
2.0000 mg | INTRAMUSCULAR | Status: DC | PRN
  Administered 2012-01-30 – 2012-02-03 (×22): 2 mg via INTRAVENOUS
  Filled 2012-01-30 (×21): qty 1

## 2012-01-30 MED ORDER — NITROGLYCERIN IN D5W 200-5 MCG/ML-% IV SOLN
2.0000 ug/min | Freq: Once | INTRAVENOUS | Status: AC
  Administered 2012-01-30: 5 ug/min via INTRAVENOUS
  Filled 2012-01-30: qty 250

## 2012-01-30 MED ORDER — ASPIRIN 81 MG PO CHEW: 324.0000 mg | CHEWABLE_TABLET | ORAL | Status: AC

## 2012-01-30 MED ORDER — SODIUM CHLORIDE 0.9 % IV SOLN
INTRAVENOUS | Status: AC
  Administered 2012-01-30: 06:00:00 via INTRAVENOUS

## 2012-01-30 MED ORDER — HEPARIN (PORCINE) IN NACL 2-0.9 UNIT/ML-% IJ SOLN
INTRAMUSCULAR | Status: AC
  Filled 2012-01-30: qty 2000

## 2012-01-30 MED ORDER — METAXALONE 800 MG PO TABS
800.0000 mg | ORAL_TABLET | Freq: Two times a day (BID) | ORAL | Status: DC | PRN
  Filled 2012-01-30: qty 1

## 2012-01-30 MED ORDER — ALPRAZOLAM 0.25 MG PO TABS
0.2500 mg | ORAL_TABLET | Freq: Four times a day (QID) | ORAL | Status: DC | PRN
  Administered 2012-01-30 – 2012-02-03 (×7): 0.25 mg via ORAL
  Filled 2012-01-30 (×7): qty 1

## 2012-01-30 MED ORDER — ALBUTEROL SULFATE (5 MG/ML) 0.5% IN NEBU
2.5000 mg | INHALATION_SOLUTION | Freq: Once | RESPIRATORY_TRACT | Status: AC
  Administered 2012-01-30: 2.5 mg via RESPIRATORY_TRACT

## 2012-01-30 MED ORDER — BIOTENE DRY MOUTH MT LIQD
15.0000 mL | Freq: Two times a day (BID) | OROMUCOSAL | Status: DC
  Administered 2012-01-30 – 2012-02-03 (×8): 15 mL via OROMUCOSAL

## 2012-01-30 MED ORDER — SODIUM CHLORIDE 0.9 % IV SOLN: 250.0000 mL | INTRAVENOUS | Status: DC | PRN

## 2012-01-30 MED ORDER — ASPIRIN 325 MG PO TABS
325.0000 mg | ORAL_TABLET | Freq: Every day | ORAL | Status: DC
  Administered 2012-01-30 – 2012-02-03 (×5): 325 mg via ORAL
  Filled 2012-01-30 (×6): qty 1

## 2012-01-30 MED ORDER — ALUM & MAG HYDROXIDE-SIMETH 200-200-20 MG/5ML PO SUSP
30.0000 mL | ORAL | Status: DC | PRN
  Administered 2012-01-30 – 2012-02-03 (×4): 30 mL via ORAL
  Filled 2012-01-30 (×4): qty 30

## 2012-01-30 MED ORDER — MORPHINE SULFATE 2 MG/ML IJ SOLN
INTRAMUSCULAR | Status: AC
  Administered 2012-01-30: 2 mg via INTRAVENOUS
  Filled 2012-01-30: qty 1

## 2012-01-30 MED ORDER — HEPARIN (PORCINE) IN NACL 100-0.45 UNIT/ML-% IJ SOLN
1100.0000 [IU]/h | INTRAMUSCULAR | Status: DC
  Administered 2012-01-30: 1100 [IU]/h via INTRAVENOUS
  Filled 2012-01-30: qty 250

## 2012-01-30 NOTE — Progress Notes (Signed)
Pt admitted three hours ago with NSTEMI. Cath with severe triple vessel disease. LV function is low normal. Renal function ok. Will ask CT surgery to see this am to consider CABG for revascularization. Continue IV NTG and restart heparin 6 hours post sheath removal/Angioseal. Echo this am. Plavix stopped. P2Y12 is 287. Will add morphine for pain control. Follow cardiac enzymes.   [name] 7:31 AM 01/30/2012

## 2012-01-30 NOTE — ED Provider Notes (Signed)
History     CSN: [id number]  Arrival date & time 01/30/12  0120   First MD Initiated Contact with Patient 01/30/12 0150      Chief Complaint  Patient presents with  . Chest Pain    (Consider location/radiation/quality/duration/timing/severity/associated sxs/prior treatment) HPI Comments: Patient reports a history of coronary disease with known blockages and at least 2 stents. She reports that she's had 2 mild heart attacks in the past. She reports that she has had similar symptoms of intermittent chest pains in her left anterior chest that radiates around her left upper chest wall underneath her breast area that last a few minutes since last Sunday. However she also describes an indigestion-type pain that she feels in her left upper back region that has been more constant, waxing and waning since Sunday as well. With very little exertion the pain seems to get worse. Tonight it was particularly severe and she took 3 nitroglycerin with very transient improvement. She reports she has been taking aspirin and Plavix as prescribed including today. She denies coughing or URI symptoms she reports that she has 8/10 pain currently. She reports she has had some mild nausea and deathly sweating with her symptoms. Level 5 caveat due to urgent need for intervention.  Patient is a 62 y.o. female presenting with chest pain. The history is provided by the patient.  Chest Pain     Past Medical History  Diagnosis Date  . PERIPHERAL VASCULAR DISEASE 07/15/2009  . MYOCARDIAL INFARCTION, HX OF 08/23/2007  . HYPERTENSION 08/23/2007  . HYPERLIPIDEMIA 08/23/2007  . CORONARY ARTERY DISEASE 08/23/2007  . Somatization disorder 08/23/2007  . Shortness of breath 03/03/2009  . RENAL INSUFFICIENCY 03/02/2009  . PERIPHERAL EDEMA   . OSTEOARTHRITIS   . OBESITY   . MENOPAUSAL DISORDER   . LOW BACK PAIN   . IBS   . HYPOTHYROIDISM   . HYPOKALEMIA   . GLUCOSE INTOLERANCE   . GERD   . FIBROMYALGIA   . FATIGUE,  CHRONIC   . DIVERTICULITIS, HX OF   . COPD   . COLONIC POLYPS, HX OF   . CHEST PAIN   . BREAST MASS, LEFT   . ASTHMA   . ANXIETY   . ANEMIA-IRON DEFICIENCY   . ALLERGIC RHINITIS     Past Surgical History  Procedure Date  . Cholecystectomy   . Oophorectomy   . Replacement total knee   . Coronary stent placement     History reviewed. No pertinent family history.  History  Substance Use Topics  . Smoking status: Never Smoker   . Smokeless tobacco: Not on file  . Alcohol Use: No    OB History    Grav Para Term Preterm Abortions TAB SAB Ect Mult Living                  Review of Systems  Cardiovascular: Positive for chest pain.  All other systems reviewed and are negative.    Allergies  Sulfonamide derivatives  Home Medications   Current Outpatient Rx  Name Route Sig Dispense Refill  . ALBUTEROL SULFATE HFA 108 (90 BASE) MCG/ACT IN AERS Inhalation Inhale 2 puffs into the lungs every 6 (six) hours as needed. Shortness of breath    . ASPIRIN 325 MG PO TABS Oral Take 325 mg by mouth daily.    Marland Kitchen CARVEDILOL 25 MG PO TABS Oral Take 1 tablet (25 mg total) by mouth 2 (two) times daily with a meal. 60 tablet 12  . CLOPIDOGREL  BISULFATE 75 MG PO TABS Oral Take 1 tablet (75 mg total) by mouth daily. 30 tablet 12  . ISOSORBIDE MONONITRATE ER 60 MG PO TB24 Oral Take 1 tablet (60 mg total) by mouth daily. 30 tablet 12  . LEVOTHYROXINE SODIUM 125 MCG PO TABS  TAKE 1 TABLET BY MOUTH ONCE A DAY 30 tablet 0  . LOSARTAN POTASSIUM 100 MG PO TABS Oral Take 50 mg by mouth daily.    Marland Kitchen METAXALONE 800 MG PO TABS Oral Take 800 mg by mouth 2 (two) times daily as needed.     Marland Kitchen NITROGLYCERIN 0.4 MG SL SUBL Sublingual Place 0.4 mg under the tongue every 5 (five) minutes as needed. Chest pain    . OMEPRAZOLE 20 MG PO CPDR Oral Take 20 mg by mouth 2 (two) times daily.      Marland Kitchen PRAVASTATIN SODIUM 40 MG PO TABS Oral Take 1 tablet (40 mg total) by mouth daily. 30 tablet 12  . FLUTICASONE-SALMETEROL  250-50 MCG/DOSE IN AEPB Inhalation Inhale 1 puff into the lungs every 12 (twelve) hours as needed. Shortness of breath      BP 167/84  Pulse 89  Temp(Src) 98.8 F (37.1 C) (Oral)  Resp 16  SpO2 100%  Physical Exam  Nursing note and vitals reviewed. Constitutional: She is oriented to person, place, and time. She appears well-developed and well-nourished. No distress.  HENT:  Head: Normocephalic and atraumatic.  Eyes: Pupils are equal, round, and reactive to light.  Neck: Normal range of motion. Neck supple.  Cardiovascular: Normal rate.   No murmur heard. Pulmonary/Chest: Effort normal.  Abdominal: Soft. Bowel sounds are normal.  Neurological: She is alert and oriented to person, place, and time.  Skin: Skin is warm and dry. She is not diaphoretic. No erythema.    ED Course  Procedures (including critical care time)  CRITICAL CARE Performed by: Lear Ng.   Total critical care time: 40 min Critical care time was exclusive of separately billable procedures and treating other patients.  Critical care was necessary to treat or prevent imminent or life-threatening deterioration.  Critical care was time spent personally by me on the following activities: development of treatment plan with patient and/or surrogate as well as nursing, discussions with consultants, evaluation of patient's response to treatment, examination of patient, obtaining history from patient or surrogate, ordering and performing treatments and interventions, ordering and review of laboratory studies, ordering and review of radiographic studies, pulse oximetry and re-evaluation of patient's condition.   Labs Reviewed  CBC - Abnormal; Notable for the following:    WBC 11.1 (*)    All other components within normal limits  DIFFERENTIAL - Abnormal; Notable for the following:    Lymphs Abs 4.5 (*)    All other components within normal limits  BASIC METABOLIC PANEL - Abnormal; Notable for the following:      Glucose, Bld 124 (*)    GFR calc non Af Amer 73 (*)    GFR calc Af Amer 85 (*)    All other components within normal limits  POCT I-STAT TROPONIN I - Abnormal; Notable for the following:    Troponin i, poc 0.33 (*)    All other components within normal limits  APTT  PROTIME-INR  HEPARIN LEVEL (UNFRACTIONATED)   Dg Chest 2 View  01/30/2012  *RADIOLOGY REPORT*  Clinical Data: Substernal chest pain; shortness of breath, nausea and diaphoresis.  CHEST - 2 VIEW  Comparison: Chest radiograph performed 08/17/2011  Findings: The lungs are well-aerated.  Mild chronic peribronchial thickening is noted.  There is no evidence of focal opacification, pleural effusion or pneumothorax.  The heart is normal in size; the mediastinal contour is within normal limits.  No acute osseous abnormalities are seen.  IMPRESSION: No acute cardiopulmonary process seen.  Original Report Authenticated By: Tonia Ghent, M.D.   I reviewed the above CXR myself.     1. Acute coronary syndrome     ECG performed at 01 25 shows a normal sinus rhythm at rate of 89. Poor R-wave progression noted between leads V2 and V3. Q-wave noted in lead 3. Nonspecific ST depression noted in leads 1 and aVL. Inferior Q waves presence is improved compared to EKG from 08/20/2011.   0300 Discussed with Dr. Mayford Knife with cardiology who has seen patient and will likely admit and possibly take to cath lab.  Pt's symptoms did improve some initially with IV NTG, but not completely. Dr. Mayford Knife aware.    MDM   Patient's symptomatology is suspicious for unstable angina do to increase in her anginal equivalent with minimal exertion over the past 3 days. Patient is mildly hypertensive here with ongoing chest and back discomfort. She is RE taken 3 sublingual nitroglycerin, will start her on an IV drip. Her i-STAT troponin is slightly elevated at 0.33 which is suggestive of acute coronary syndrome. My plan is also start her on IV heparin and will consult  cardiology for admission to step down her ICU. Chest x-ray shows no widened mediastinum, infiltrates. She is afebrile. Room air saturation is 95% and normal.        Gavin Pound. Oletta Lamas, MD 01/31/12 1191

## 2012-01-30 NOTE — Progress Notes (Signed)
   CARE MANAGEMENT NOTE 01/30/2012  Patient:  [name], [name]   Account Number:  [id number]  Date Initiated:  01/30/2012  Documentation initiated by:  GRAVES-BIGELOW,Clint Strupp  Subjective/Objective Assessment:   Pt admitted with cp-Nstemi. Cath revealed severe triple vessel disease. Possible CABG. Pt without insurance and she has concerns with bill.     Action/Plan:   CM did call [facility] to assist with payment of bill. Pt has PCP Oliver Barre. She uses [facility] for medications. CM did discuss with pt that if she has CABG may need HH services at d/c. She has used [facility] in past.   Anticipated DC Date:  02/06/2012   Anticipated DC Plan:  HOME W HOME HEALTH SERVICES      DC Planning Services  CM consult      Choice offered to / List presented to:             Status of service:  In process, will continue to follow Medicare Important Message given?   (If response is "NO", the following Medicare IM given date fields will be blank) Date Medicare IM given:   Date Additional Medicare IM given:    Discharge Disposition:    Per UR Regulation:    If discussed at Long Length of Stay Meetings, dates discussed:    Comments:  01-30-12 1049 Tomi Bamberger, RN,BSN [phone] CM will continue to monitor for d/c disposition needs. MD at d/c please try to keep cost down by writin all generic meds if possible. Thanks

## 2012-01-30 NOTE — Progress Notes (Signed)
ANTICOAGULATION CONSULT NOTE - Initial Consult  Pharmacy Consult for Heparin Indication: chest pain/ACS  Allergies  Allergen Reactions  . Sulfonamide Derivatives     Patient Measurements: Height 60 inches Weight 90 kg   Vital Signs: Temp: 98.8 F (37.1 C) (03/27 0128) Temp src: Oral (03/27 0128) BP: 167/84 mmHg (03/27 0223) Pulse Rate: 89  (03/27 0128)  Labs:  Basename 01/30/12 0026  HGB 12.7  HCT 39.7  PLT 355  APTT --  LABPROT --  INR --  HEPARINUNFRC --  CREATININE 0.85  CKTOTAL --  CKMB --  TROPONINI --   The CrCl is unknown because both a height and weight (above a minimum accepted value) are required for this calculation.  Medical History: Past Medical History  Diagnosis Date  . PERIPHERAL VASCULAR DISEASE 07/15/2009  . MYOCARDIAL INFARCTION, HX OF 08/23/2007  . HYPERTENSION 08/23/2007  . HYPERLIPIDEMIA 08/23/2007  . CORONARY ARTERY DISEASE 08/23/2007  . Somatization disorder 08/23/2007  . Shortness of breath 03/03/2009  . RENAL INSUFFICIENCY 03/02/2009  . PERIPHERAL EDEMA   . OSTEOARTHRITIS   . OBESITY   . MENOPAUSAL DISORDER   . LOW BACK PAIN   . IBS   . HYPOTHYROIDISM   . HYPOKALEMIA   . GLUCOSE INTOLERANCE   . GERD   . FIBROMYALGIA   . FATIGUE, CHRONIC   . DIVERTICULITIS, HX OF   . COPD   . COLONIC POLYPS, HX OF   . CHEST PAIN   . BREAST MASS, LEFT   . ASTHMA   . ANXIETY   . ANEMIA-IRON DEFICIENCY   . ALLERGIC RHINITIS     Medications:  ASA  Coreg  Plavix  Advair  Albuterol  Imur  Synthroid  Cozaar  Skelaxin  Ntg  Prilosec    Assessment: 61 yo female with chest pain for Heparin  Goal of Therapy:  Heparin  Level 0.3-0.7   Plan:  Heparin 4000 units IV bolus, then 1100 units/hr Check heparin level in 6 hours.  Sheranda Seabrooks, Gary Fleet 01/30/2012,2:25 AM

## 2012-01-30 NOTE — H&P (Signed)
Admit date: 01/30/2012 Referring Physician  Dr. Oletta Lamas Primary Cardiologist  Dr. Eden Emms Chief complaint/reason for admission:  Chest pain  HPI: This is a 60yo WF with a history of CAD s/p IWMI with DES x2 to RCA in 2007 complicated by hypotension/bradycardia and admit 08/2011 for chest pain and STEMI and was taken emergently to the cath lab.  Cath revealed a 2mm caliber occluded PL and medical therapy was recommended.  She had multivessel noncritical CAD from 30-80% that was treated medically.  EF at that time was 55% with mild basal inferior wall hypokinesis.  She presents to the ER tonight with complaints of chest pain.  This started on Saturday the 23rd and waxed and waned all weekend in severity but never went away.  She did not seek any medical attention due to lack of insurance.  She says that tonight the pain became severe and she could not stand it any more.  She describes a pressure pain that also has a burning quality which is identical to her prior angina.  She also has a sharp pain that goes with the pressure pain.  She is currently on IV Heparin gtt and NTG gtt with 10/10 pain.    PMH:    Past Medical History  Diagnosis Date  . PERIPHERAL VASCULAR DISEASE 07/15/2009  . MYOCARDIAL INFARCTION, HX OF 08/23/2007  . HYPERTENSION 08/23/2007  . HYPERLIPIDEMIA 08/23/2007  . CORONARY ARTERY DISEASE 08/23/2007  . Somatization disorder 08/23/2007  . Shortness of breath 03/03/2009  . RENAL INSUFFICIENCY 03/02/2009  . PERIPHERAL EDEMA   . OSTEOARTHRITIS   . OBESITY   . MENOPAUSAL DISORDER   . LOW BACK PAIN   . IBS   . HYPOTHYROIDISM   . HYPOKALEMIA   . GLUCOSE INTOLERANCE   . GERD   . FIBROMYALGIA   . FATIGUE, CHRONIC   . DIVERTICULITIS, HX OF   . COPD   . COLONIC POLYPS, HX OF   . CHEST PAIN   . BREAST MASS, LEFT   . ASTHMA   . ANXIETY   . ANEMIA-IRON DEFICIENCY   . ALLERGIC RHINITIS     PSH:    Past Surgical History  Procedure Date  . Cholecystectomy   . Oophorectomy   .  Replacement total knee   . Coronary stent placement     ALLERGIES:   Sulfonamide derivatives  Prior to Admit Meds:   (Not in a hospital admission) Family HX:   History reviewed. No pertinent family history. Social HX:    History   Social History  . Marital Status: Married    Spouse Name: N/A    Number of Children: N/A  . Years of Education: N/A   Occupational History  . Not on file.   Social History Main Topics  . Smoking status: Never Smoker   . Smokeless tobacco: Not on file  . Alcohol Use: No  . Drug Use: No  . Sexually Active: Not on file   Other Topics Concern  . Not on file   Social History Narrative  . No narrative on file     ROS:  All 11 ROS were addressed and are negative except what is stated in the HPI  PHYSICAL EXAM Filed Vitals:   01/30/12 0300  BP: 153/90  Pulse: 78  Temp:   Resp: 13   General: Well developed, well nourished, in no acute distress Head: Eyes PERRLA, No xanthomas.   Normal cephalic and atramatic  Lungs:   Clear bilaterally to auscultation and percussion.  Heart:   HRRR S1 S2 Pulses are 2+ & equal.            No carotid bruit. No JVD.  No abdominal bruits. No femoral bruits. Abdomen: Bowel sounds are positive, abdomen soft and non-tender without masses  Extremities:   No clubbing, cyanosis or edema.  DP +1 Neuro: Alert and oriented X 3. Psych:  Good affect, responds appropriately   Labs:   Lab Results  Component Value Date   WBC 11.1* 01/30/2012   HGB 12.7 01/30/2012   HCT 39.7 01/30/2012   MCV 87.4 01/30/2012   PLT 355 01/30/2012    Lab 01/30/12 0026  NA 139  K 3.8  CL 100  CO2 28  BUN 12  CREATININE 0.85  CALCIUM 9.9  PROT --  BILITOT --  ALKPHOS --  ALT --  AST --  GLUCOSE 124*   Lab Results  Component Value Date   CKTOTAL 100 08/20/2011   CKMB 2.8 08/20/2011   TROPONINI 1.01* 08/20/2011   No results found for this basename: PTT   Lab Results  Component Value Date   INR 1.01 01/30/2012   INR 0.9  02/15/2009   INR 0.9 11/02/2008     Lab Results  Component Value Date   CHOL 231* 08/17/2011   CHOL 235* 08/22/2010   CHOL 281* 07/08/2009   Lab Results  Component Value Date   HDL 42 08/17/2011   HDL 40.10 08/22/2010   HDL 48.40 07/08/2009   Lab Results  Component Value Date   LDLCALC 147* 08/17/2011   LDLCALC  Value: 107 (NOTE)  Total Cholesterol/HDL Ratio:CHD Risk                       Coronary Heart Disease Risk Table                                       Men       Women         1/2 Average Risk              3.4        3.3             Average Risk              5.0         4.4         2 X Average Risk              9.6        7.1         3 X Average Risk             23.4       11.0 Use the calculated Patient Ratio above and the CHD Risk table  to determine the patient's CHD Risk. ATP III Classification (LDL):      < 100         mg/dL         Optimal     161 - 129     mg/dL         Near or Above Optimal     130 - 159     mg/dL         Borderline High     160 - 189     mg/dL  High      > 190        mg/dL         Very High * 7/56/4332   LDLCALC  Value: 126        Total Cholesterol/HDL:CHD Risk Coronary Heart Disease Risk Table                     Men   Women  1/2 Average Risk   3.4   3.3* 06/19/2007   Lab Results  Component Value Date   TRIG 209* 08/17/2011   TRIG 198.0* 08/22/2010   TRIG 192.0* 07/08/2009   Lab Results  Component Value Date   CHOLHDL 5.5 08/17/2011   CHOLHDL 6 08/22/2010   CHOLHDL 6 07/08/2009   Lab Results  Component Value Date   LDLDIRECT 168.9 08/22/2010   LDLDIRECT 204.8 07/08/2009   LDLDIRECT 188.6 06/07/2008      Radiology:   *RADIOLOGY REPORT*  Clinical Data: Substernal chest pain; shortness of breath, nausea  and diaphoresis.  CHEST - 2 VIEW  Comparison: Chest radiograph performed 08/17/2011  Findings: The lungs are well-aerated. Mild chronic peribronchial  thickening is noted. There is no evidence of focal opacification,  pleural effusion or  pneumothorax.  The heart is normal in size; the mediastinal contour is within  normal limits. No acute osseous abnormalities are seen.  IMPRESSION:  No acute cardiopulmonary process seen.  Original Report Authenticated By: Tonia Ghent, M.D.            External Result Report      EKG:  NSR with diffuse ST depression less than 1mm in the inferolateral leads   ASSESSMENT:  1.  NSTEMI with ongoing chest pain and nausea despite IV Heparin gtt/ IV NTG/ASA/morphine 2.  CAD with last cath 07/2011 60% ostial left circ, 80% ramus, 50% OM2, 60% prox/mid LAD, 30-40% D3, 80% d1, 70% D2, 40% prox RCA, occluded small PL 3.  HTN 4.  Asthma 5.  OSA 6.  Dyslipidemia  PLAN:   1.  Admit to CCU 2.  Continue to cycle cardiac enzymes until they peak 3.  IV Heparin gtt per pharmacy/ASA/morphine 4.  IV NTG gtt titrate for chest pain 5.  Discussed with Dr. Clifton James and given patient's ongoing CP despite medical therapy and positive cardiac markers will proceed with cardiac catheterization.   Quintella Reichert, MD  01/30/2012  3:26 AM

## 2012-01-30 NOTE — ED Notes (Signed)
Patient states she has had chest pain and indigestion with pain radiating to the left arm and radiating to her back and SOB. Patient states HX of two previous MI and stent placements. Patient placed on monitor and 2L oxygen with sats of 100%. EDP at bedside.

## 2012-01-30 NOTE — Consult Note (Signed)
Reason for Consult:3 vessel CAD, s/p NQWMI Referring Physician: Dr. Dillard Cannon is an 61 y.o. female.  HPI: 61 yo WF with known CAD(previous stent to RCA). Presents with 5 day history of waxing and waning chest pain. She states that she had had a few episodes of similar pain with exertion in the weeks leading up to this event. She also became diaphoretic frequently with exertion. She also complains of feeling very fatigued for several months prior. Her current pain is substernal, "burning' and radiates through to her back.  Past Medical History  Diagnosis Date  . PERIPHERAL VASCULAR DISEASE 07/15/2009  . MYOCARDIAL INFARCTION, HX OF 08/23/2007  . HYPERTENSION 08/23/2007  . HYPERLIPIDEMIA 08/23/2007  . CORONARY ARTERY DISEASE 08/23/2007  . Somatization disorder 08/23/2007  . Shortness of breath 03/03/2009  . RENAL INSUFFICIENCY 03/02/2009  . PERIPHERAL EDEMA   . OSTEOARTHRITIS   . OBESITY   . MENOPAUSAL DISORDER   . LOW BACK PAIN   . IBS   . HYPOTHYROIDISM   . HYPOKALEMIA   . GLUCOSE INTOLERANCE   . GERD   . FIBROMYALGIA   . FATIGUE, CHRONIC   . DIVERTICULITIS, HX OF   . COPD   . COLONIC POLYPS, HX OF   . CHEST PAIN   . BREAST MASS, LEFT   . ASTHMA   . ANXIETY   . ANEMIA-IRON DEFICIENCY   . ALLERGIC RHINITIS   . Angina   . Stroke   . Blood transfusion    She denies Peripheral arterial disease   Past Surgical History  Procedure Date  . Cholecystectomy   . Oophorectomy   . Replacement total knee   . Coronary stent placement   . Abdominal hysterectomy   . Colon surgery   . Cardiac catheterization   . Appendectomy   . Tonsillectomy   . Coronary angioplasty     History reviewed. No pertinent family history.  Social History:  reports that she has never smoked. She does not have any smokeless tobacco history on file. She reports that she does not drink alcohol or use illicit drugs.  Allergies:  Allergies  Allergen Reactions  . Sulfonamide  Derivatives     Medications:  I have reviewed the patient's current medications. Prior to Admission:  Prescriptions prior to admission  Medication Sig Dispense Refill  . albuterol (PROVENTIL HFA;VENTOLIN HFA) 108 (90 BASE) MCG/ACT inhaler Inhale 2 puffs into the lungs every 6 (six) hours as needed. Shortness of breath      . aspirin 325 MG tablet Take 325 mg by mouth daily.      . carvedilol (COREG) 25 MG tablet Take 1 tablet (25 mg total) by mouth 2 (two) times daily with a meal.  60 tablet  12  . clopidogrel (PLAVIX) 75 MG tablet Take 1 tablet (75 mg total) by mouth daily.  30 tablet  12  . isosorbide mononitrate (IMDUR) 60 MG 24 hr tablet Take 1 tablet (60 mg total) by mouth daily.  30 tablet  12  . levothyroxine (SYNTHROID, LEVOTHROID) 125 MCG tablet TAKE 1 TABLET BY MOUTH ONCE A DAY  30 tablet  0  . losartan (COZAAR) 100 MG tablet Take 50 mg by mouth daily.      . metaxalone (SKELAXIN) 800 MG tablet Take 800 mg by mouth 2 (two) times daily as needed.       . nitroGLYCERIN (NITROSTAT) 0.4 MG SL tablet Place 0.4 mg under the tongue every 5 (five) minutes as needed. Chest pain      .  omeprazole (PRILOSEC) 20 MG capsule Take 20 mg by mouth 2 (two) times daily.        . pravastatin (PRAVACHOL) 40 MG tablet Take 1 tablet (40 mg total) by mouth daily.  30 tablet  12  . Fluticasone-Salmeterol (ADVAIR) 250-50 MCG/DOSE AEPB Inhale 1 puff into the lungs every 12 (twelve) hours as needed. Shortness of breath        Results for orders placed during the hospital encounter of 01/30/12 (from the past 48 hour(s))  CBC     Status: Abnormal   Collection Time   01/30/12 12:26 AM      Component Value Range Comment   WBC 11.1 (*) 4.0 - 10.5 (K/uL)    RBC 4.54  3.87 - 5.11 (MIL/uL)    Hemoglobin 12.7  12.0 - 15.0 (g/dL)    HCT 16.1  09.6 - 04.5 (%)    MCV 87.4  78.0 - 100.0 (fL)    MCH 28.0  26.0 - 34.0 (pg)    MCHC 32.0  30.0 - 36.0 (g/dL)    RDW 40.9  81.1 - 91.4 (%)    Platelets 355  150 - 400  (K/uL)   DIFFERENTIAL     Status: Abnormal   Collection Time   01/30/12 12:26 AM      Component Value Range Comment   Neutrophils Relative 48  43 - 77 (%)    Neutro Abs 5.4  1.7 - 7.7 (K/uL)    Lymphocytes Relative 41  12 - 46 (%)    Lymphs Abs 4.5 (*) 0.7 - 4.0 (K/uL)    Monocytes Relative 8  3 - 12 (%)    Monocytes Absolute 0.9  0.1 - 1.0 (K/uL)    Eosinophils Relative 3  0 - 5 (%)    Eosinophils Absolute 0.3  0.0 - 0.7 (K/uL)    Basophils Relative 0  0 - 1 (%)    Basophils Absolute 0.0  0.0 - 0.1 (K/uL)   BASIC METABOLIC PANEL     Status: Abnormal   Collection Time   01/30/12 12:26 AM      Component Value Range Comment   Sodium 139  135 - 145 (mEq/L)    Potassium 3.8  3.5 - 5.1 (mEq/L)    Chloride 100  96 - 112 (mEq/L)    CO2 28  19 - 32 (mEq/L)    Glucose, Bld 124 (*) 70 - 99 (mg/dL)    BUN 12  6 - 23 (mg/dL)    Creatinine, Ser 7.82  0.50 - 1.10 (mg/dL)    Calcium 9.9  8.4 - 10.5 (mg/dL)    GFR calc non Af Amer 73 (*) >90 (mL/min)    GFR calc Af Amer 85 (*) >90 (mL/min)   APTT     Status: Normal   Collection Time   01/30/12  1:16 AM      Component Value Range Comment   aPTT 34  24 - 37 (seconds)   PROTIME-INR     Status: Normal   Collection Time   01/30/12  1:16 AM      Component Value Range Comment   Prothrombin Time 13.5  11.6 - 15.2 (seconds)    INR 1.01  0.00 - 1.49    POCT I-STAT TROPONIN I     Status: Abnormal   Collection Time   01/30/12  1:34 AM      Component Value Range Comment   Troponin i, poc 0.33 (*) 0.00 - 0.08 (ng/mL)  Comment NOTIFIED PHYSICIAN      Comment 3            MRSA PCR SCREENING     Status: Normal   Collection Time   01/30/12  6:04 AM      Component Value Range Comment   MRSA by PCR NEGATIVE  NEGATIVE    CARDIAC PANEL(CRET KIN+CKTOT+MB+TROPI)     Status: Abnormal   Collection Time   01/30/12  6:13 AM      Component Value Range Comment   Total CK 191 (*) 7 - 177 (U/L)    CK, MB 5.0 (*) 0.3 - 4.0 (ng/mL)    Troponin I 0.72 (*) <0.30  (ng/mL)    Relative Index 2.6 (*) 0.0 - 2.5    PROTIME-INR     Status: Normal   Collection Time   01/30/12  6:13 AM      Component Value Range Comment   Prothrombin Time 14.0  11.6 - 15.2 (seconds)    INR 1.06  0.00 - 1.49    CBC     Status: Normal   Collection Time   01/30/12  6:13 AM      Component Value Range Comment   WBC 8.3  4.0 - 10.5 (K/uL)    RBC 4.34  3.87 - 5.11 (MIL/uL)    Hemoglobin 12.3  12.0 - 15.0 (g/dL)    HCT 04.5  40.9 - 81.1 (%)    MCV 88.0  78.0 - 100.0 (fL)    MCH 28.3  26.0 - 34.0 (pg)    MCHC 32.2  30.0 - 36.0 (g/dL)    RDW 91.4  78.2 - 95.6 (%)    Platelets 309  150 - 400 (K/uL)   DIFFERENTIAL     Status: Normal   Collection Time   01/30/12  6:13 AM      Component Value Range Comment   Neutrophils Relative 53  43 - 77 (%)    Neutro Abs 4.4  1.7 - 7.7 (K/uL)    Lymphocytes Relative 40  12 - 46 (%)    Lymphs Abs 3.3  0.7 - 4.0 (K/uL)    Monocytes Relative 5  3 - 12 (%)    Monocytes Absolute 0.4  0.1 - 1.0 (K/uL)    Eosinophils Relative 2  0 - 5 (%)    Eosinophils Absolute 0.2  0.0 - 0.7 (K/uL)    Basophils Relative 1  0 - 1 (%)    Basophils Absolute 0.0  0.0 - 0.1 (K/uL)   TSH     Status: Abnormal   Collection Time   01/30/12  6:13 AM      Component Value Range Comment   TSH 4.936 (*) 0.350 - 4.500 (uIU/mL)   COMPREHENSIVE METABOLIC PANEL     Status: Abnormal   Collection Time   01/30/12  6:13 AM      Component Value Range Comment   Sodium 139  135 - 145 (mEq/L)    Potassium 4.1  3.5 - 5.1 (mEq/L)    Chloride 99  96 - 112 (mEq/L)    CO2 29  19 - 32 (mEq/L)    Glucose, Bld 120 (*) 70 - 99 (mg/dL)    BUN 11  6 - 23 (mg/dL)    Creatinine, Ser 2.13  0.50 - 1.10 (mg/dL)    Calcium 08.6  8.4 - 10.5 (mg/dL)    Total Protein 6.9  6.0 - 8.3 (g/dL)    Albumin 3.5  3.5 - 5.2 (  g/dL)    AST 20  0 - 37 (U/L)    ALT 16  0 - 35 (U/L)    Alkaline Phosphatase 70  39 - 117 (U/L)    Total Bilirubin 0.2 (*) 0.3 - 1.2 (mg/dL)    GFR calc non Af Amer 71 (*) >90  (mL/min)    GFR calc Af Amer 82 (*) >90 (mL/min)   MAGNESIUM     Status: Normal   Collection Time   01/30/12  6:13 AM      Component Value Range Comment   Magnesium 1.7  1.5 - 2.5 (mg/dL)   PLATELET INHIBITION P2Y12     Status: Normal   Collection Time   01/30/12  6:13 AM      Component Value Range Comment   Platelet Function  P2Y12 287  194 - 418 (PRU)   CARDIAC PANEL(CRET KIN+CKTOT+MB+TROPI)     Status: Abnormal   Collection Time   01/30/12 11:10 AM      Component Value Range Comment   Total CK 174  7 - 177 (U/L)    CK, MB 5.8 (*) 0.3 - 4.0 (ng/mL)    Troponin I 0.66 (*) <0.30 (ng/mL)    Relative Index 3.3 (*) 0.0 - 2.5      Dg Chest 2 View  01/30/2012  *RADIOLOGY REPORT*  Clinical Data: Substernal chest pain; shortness of breath, nausea and diaphoresis.  CHEST - 2 VIEW  Comparison: Chest radiograph performed 08/17/2011  Findings: The lungs are well-aerated.  Mild chronic peribronchial thickening is noted.  There is no evidence of focal opacification, pleural effusion or pneumothorax.  The heart is normal in size; the mediastinal contour is within normal limits.  No acute osseous abnormalities are seen.  IMPRESSION: No acute cardiopulmonary process seen.  Original Report Authenticated By: Tonia Ghent, M.D.    Review of Systems  Constitutional: Positive for malaise/fatigue and diaphoresis. Negative for fever, chills and weight loss.  HENT: Positive for neck pain.   Respiratory: Positive for shortness of breath. Negative for cough.   Cardiovascular: Positive for chest pain and leg swelling. Negative for palpitations and claudication.  Gastrointestinal: Positive for heartburn.  Genitourinary: Negative.   Musculoskeletal: Positive for myalgias, back pain and joint pain.  Skin: Negative.   Neurological: Positive for weakness and headaches.  Endo/Heme/Allergies: Does not bruise/bleed easily.  All other systems reviewed and are negative.   Blood pressure 100/66, pulse 74,  temperature 98 F (36.7 C), temperature source Oral, resp. rate 17, height 5' (1.524 m), weight 206 lb 5.6 oz (93.6 kg), SpO2 91.00%. Physical Exam  Vitals reviewed. Constitutional: She is oriented to person, place, and time. No distress.       Obese   HENT:  Head: Normocephalic and atraumatic.  Eyes: EOM are normal. Pupils are equal, round, and reactive to light.  Neck: Neck supple. No thyromegaly present.  Cardiovascular: Normal rate, regular rhythm, normal heart sounds and intact distal pulses.  Exam reveals no gallop and no friction rub.   No murmur heard. Respiratory: Effort normal. She has no wheezes. She has no rales.  GI: Soft. There is no tenderness.  Musculoskeletal: She exhibits no edema.  Lymphadenopathy:    She has no cervical adenopathy.  Neurological: She is alert and oriented to person, place, and time. No cranial nerve deficit.  Skin: Skin is warm and dry.    Assessment/Plan: 62 yo WF with a strong family history of CAD and multiple other risk factors, who presents with 5 days  of CP and has positive enzymes. She has been on plavix due to a previous stent. She now has severe 3 vessel CAD. CABG is indicated for survival benefit and relief of symptoms.  I have discussed with the patient and her husband the general nature of the procedure, need for general anesthesia,and incisions to be used. I have discussed the expected hospital stay, overall recovery and short and long term outcomes. They understand CABG is palliative and there is risk of future cardiac events. They understand the risks of surgery include but are not limited to death, stroke, MI, DVT/PE, bleeding, possible need for transfusion, infections,other organ system dysfunction including respiratory, renal, or GI complications. She understands and accepts these risks and agrees to proceed.  We will tentatively plan for surgery on Monday 4/1 to allow for plavix "washout" if her clinical status allows it. If she  develops unstable CP we may be forced to proceed sooner.   Karsyn Rochin C 01/30/2012, 4:58 PM

## 2012-01-30 NOTE — Progress Notes (Signed)
[name], [name] F UR Completed. [phone]  

## 2012-01-30 NOTE — ED Notes (Signed)
PT. REPORTS INTERMITTENT SUBSTERNAL CHEST PAIN "INDIGESTION/BURNING" SINCE LAST Sunday WITH SOB AND NAUSEA / DIAPHORESIS.

## 2012-01-30 NOTE — Interval H&P Note (Signed)
History and Physical Interval Note:  01/30/2012 4:30 AM  [name]  has presented today for cardiac cath with the diagnosis of Non-stemi  The various methods of treatment have been discussed with the patient and family. After consideration of risks, benefits and other options for treatment, the patient has consented to  Procedure(s) (LRB): LEFT HEART CATHETERIZATION WITH CORONARY ANGIOGRAM (N/A) as a surgical intervention .  The patients' history has been reviewed, patient examined, no change in status, stable for surgery.  I have reviewed the patients' chart and labs.  Questions were answered to the patient's satisfaction.     MCALHANY,CHRISTOPHER

## 2012-01-30 NOTE — CV Procedure (Signed)
Cardiac Catheterization Operative Report  [name] [id number] 3/27/20134:57 AM Oliver Barre, MD, MD  Procedure Performed:  1. Left Heart Catheterization 2. Selective Coronary Angiography 3. Left ventricular angiogram 4. Placement Angioseal femoral artery closure device  Operator: Verne Carrow, MD  Indication:  NSTEMI, known CAD                                     Procedure Details: The risks, benefits, complications, treatment options, and expected outcomes were discussed with the patient by myself before the procedure. Since she was an emergent case, emergency consent was obtained.  The patient was brought to the cath lab from the ED. The patient was further sedated with Versed and Fentanyl. The right groin was prepped and draped in the usual manner. Using the modified Seldinger access technique, a 6 French sheath was placed in the right femoral artery. Standard diagnostic catheters were used to perform selective coronary angiography. A pigtail catheter was used to perform a left ventricular angiogram. An Angioseal femoral artery closure device was placed in the right femoral artery.   There were no immediate complications. The patient was taken to the recovery area in stable condition.   Hemodynamic Findings: Central aortic pressure: 133/75 Left ventricular pressure: 164/9/19  Angiographic Findings:  Left main: 20% distal left main stenosis.   Left Anterior Descending Artery: This is a moderate to large sized vessel that courses to the apex. The proximal and mid vessel has diffuse disease with a long segment of 60% stenosis. There is a discreet 90% mid stenosis after the second diagonal branch. The distal vessel has a 40% eccentric stenosis. The first diagonal branch is small in caliber and has diffuse plaque disease. The second diagonal branch is moderate sized and has diffuse 99% stenosis in the proximal segment.   Circumflex Artery: Moderate sized vessel that  gives off an intermediate branch and one moderate sized obtuse marginal branch. The ostial Circumflex has a 70% stenosis. The Intermediate branch is moderate sized and has diffuse proximal 95% stenosis. The obtuse marginal branch is moderate sized and has ostial 95% stenosis followed by a 90% stenosis. The distal portion of both of these vessels is disease free and appear to be good targets for bypass.   Right Coronary Artery: Large dominant vessel. The mid vessel has diffuse 40% plaque. The distal vessel has a patent stented segment with 50% in-stent restenosis in the proximal portion of the stented segment in the distal vessel. The posterolateral branch is known to be occluded and fills late from right to right collaterals. The PDA is patent.   Left Ventricular Angiogram: LVEF=45% with inferior wall hypokinesis.   Impression: 1. Triple vessel CAD 2. NSTEMI 3. Mild LV systolic dysfunction  Recommendations: She has progression of her CAD with severe stenosis in the LAD, diagonal branch, ostial Circumflex, ramus intermediate branch, OM branch and distal RCA. I think surgical revascularization is the best option. Will consult CT surgery later this am. Will get echo. Continue medical management for now with IV NTG, beta blocker, statin, ASA. Will restart IV heparin in 6 hours. She has been on Plavix as an outpatient so this will be held. Will likely delay surgery. Can check P2Y12 tomorrow to see what her platelet inhibition is currently.        Complications:  None. The patient tolerated the procedure well.

## 2012-01-30 NOTE — Progress Notes (Signed)
ANTICOAGULATION CONSULT NOTE - Follow Up Consult  Pharmacy Consult for heparin Indication: ACS s/p cath for surgery consult  Allergies  Allergen Reactions  . Sulfonamide Derivatives     Patient Measurements: Height: 5' (152.4 cm) Weight: 206 lb 5.6 oz (93.6 kg) IBW/kg (Calculated) : 45.5  Heparin Dosing Weight: 68kg  Vital Signs: Temp: 98.3 F (36.8 C) (03/27 0722) Temp src: Oral (03/27 0722) BP: 118/66 mmHg (03/27 0700) Pulse Rate: 86  (03/27 0700)  Labs:  Alvira Philips 01/30/12 0613 01/30/12 0116 01/30/12 0026  HGB 12.3 -- 12.7  HCT 38.2 -- 39.7  PLT 309 -- 355  APTT -- 34 --  LABPROT 14.0 13.5 --  INR 1.06 1.01 --  HEPARINUNFRC -- -- --  CREATININE 0.87 -- 0.85  CKTOTAL 191* -- --  CKMB 5.0* -- --  TROPONINI 0.72* -- --   Estimated Creatinine Clearance: 70.2 ml/min (by C-G formula based on Cr of 0.87).   Medications:  Scheduled:    . antiseptic oral rinse  15 mL Mouth Rinse BID  . aspirin  162 mg Oral Once  . aspirin  324 mg Oral NOW   Or  . aspirin  300 mg Rectal NOW  . aspirin  81 mg Oral Daily  . aspirin EC  81 mg Oral Daily  . aspirin  325 mg Oral Daily  . carvedilol  25 mg Oral BID WC  . fentaNYL      . Fluticasone-Salmeterol  1 puff Inhalation BID  . heparin      . heparin  4,000 Units Intravenous Once  . levothyroxine  125 mcg Oral QAC breakfast  . lidocaine      . losartan  50 mg Oral Daily  . metoprolol      . metoprolol  2.5 mg Intravenous Once  . midazolam      .  morphine injection  2 mg Intravenous Once  . nitroGLYCERIN      . nitroGLYCERIN  2-200 mcg/min Intravenous Once  . ondansetron (ZOFRAN) IV  4 mg Intravenous Once  . pantoprazole  80 mg Oral Q1200  . simvastatin  20 mg Oral q1800  . DISCONTD: aspirin  324 mg Oral Pre-Cath  . DISCONTD: diazepam  5 mg Oral On Call  . DISCONTD: sodium chloride  3 mL Intravenous Q12H    Assessment: 61 yo female s/p cath to start heparin 6hrs post sheath removal. Patient noted with 3V CAD and for  surgery consult.  Patient was started on heparin prior to cath and received 4000 nit bolus followed by 1100 units/hr.   Goal of Therapy:  Heparin level 0.3-0.7 units/ml   Plan:  -Will resume heparin at 11:00pm at 1000 units/hr (~14 units/kg/kr) -Heparin level in 6hrs and daily with CBC daily.   Benny Lennert 01/30/2012,10:16 AM

## 2012-01-30 NOTE — Progress Notes (Signed)
ANTICOAGULATION CONSULT NOTE-Follow up  Heparin level =0.13 on 1000 units/hr Goal heparin level=0.3-0.7  Heparin level is subtherapeutic. Will increase heparin drip to 1100 units/hr (11 ml/hr) and recheck heparin level in 6 hrs.  Cardell Peach, PharmD

## 2012-01-30 NOTE — ED Notes (Signed)
Patient placed on zoll and waiting for Cath Lab.

## 2012-01-30 NOTE — Progress Notes (Signed)
  Echocardiogram 2D Echocardiogram has been performed.  Garren Greenman L 01/30/2012, 10:29 AM

## 2012-01-30 NOTE — ED Notes (Signed)
Patient remains on monitor ans 3L oxygen with sats of 100%. Patient c/o of sharp intermittent chest pain in mid chest with continuing SOB.

## 2012-01-31 DIAGNOSIS — Z01810 Encounter for preprocedural cardiovascular examination: Secondary | ICD-10-CM

## 2012-01-31 DIAGNOSIS — I200 Unstable angina: Secondary | ICD-10-CM

## 2012-01-31 LAB — CBC
HCT: 36 % (ref 36.0–46.0)
MCHC: 31.9 g/dL (ref 30.0–36.0)
Platelets: 290 10*3/uL (ref 150–400)
RDW: 15 % (ref 11.5–15.5)
WBC: 8.8 10*3/uL (ref 4.0–10.5)

## 2012-01-31 LAB — HEPARIN LEVEL (UNFRACTIONATED)
Heparin Unfractionated: 0.16 IU/mL — ABNORMAL LOW (ref 0.30–0.70)
Heparin Unfractionated: 0.58 IU/mL (ref 0.30–0.70)

## 2012-01-31 MED ORDER — PROMETHAZINE HCL 25 MG/ML IJ SOLN
12.5000 mg | Freq: Three times a day (TID) | INTRAMUSCULAR | Status: DC | PRN
  Administered 2012-01-31 – 2012-02-01 (×3): 12.5 mg via INTRAVENOUS
  Administered 2012-02-02: 10:00:00 via INTRAVENOUS
  Administered 2012-02-02 – 2012-02-03 (×3): 12.5 mg via INTRAVENOUS
  Filled 2012-01-31 (×7): qty 1

## 2012-01-31 NOTE — Progress Notes (Signed)
ANTICOAGULATION CONSULT NOTE  Pharmacy Consult for Heparin Indication: chest pain/ACS  Allergies  Allergen Reactions  . Sulfonamide Derivatives     Patient Measurements: Height 60 inches Weight 90 kg   Vital Signs: Temp: 97.3 F (36.3 C) (03/28 0800) Temp src: Oral (03/28 0800) BP: 91/63 mmHg (03/28 1100) Pulse Rate: 67  (03/28 1100)  Labs:  Basename 01/31/12 1046 01/31/12 0049 01/30/12 1723 01/30/12 1110 01/30/12 0613 01/30/12 0116 01/30/12 0026  HGB -- 11.5* -- -- 12.3 -- --  HCT -- 36.0 -- -- 38.2 -- 39.7  PLT -- 290 -- -- 309 -- 355  APTT -- -- -- -- -- 34 --  LABPROT -- -- -- -- 14.0 13.5 --  INR -- -- -- -- 1.06 1.01 --  HEPARINUNFRC 0.39 0.16* 0.13* -- -- -- --  CREATININE -- -- -- -- 0.87 -- 0.85  CKTOTAL -- -- 154 174 191* -- --  CKMB -- -- 5.9* 5.8* 5.0* -- --  TROPONINI -- -- 0.58* 0.66* 0.72* -- --   Estimated Creatinine Clearance: 70.2 ml/min (by C-G formula based on Cr of 0.87).   Assessment: 61 yo female with 3 vessel CAD awaiting CABG next week on heparin and at goal  Goal of Therapy:  Heparin  Level 0.3-0.7   Plan:  -No heparin changes -Will recheck in 6 hrs to confirm patient at goal  Benny Lennert 01/31/2012,12:48 PM

## 2012-01-31 NOTE — Progress Notes (Signed)
Patient ID: [name], female   DOB: 1951/01/29, 61 y.o.   MRN: [id number]    @ Subjective:  Denies SSCP, palpitations or Dyspnea Nausea   Objective:  Filed Vitals:   01/31/12 0500 01/31/12 0600 01/31/12 0700 01/31/12 0800  BP: 99/70 104/68 106/63   Pulse: 74 80 79 81  Temp:    97.3 F (36.3 C)  TempSrc:    Oral  Resp: 18 19  22   Height:      Weight:      SpO2: 96% 97% 100% 100%    Intake/Output from previous day:  Intake/Output Summary (Last 24 hours) at 01/31/12 0848 Last data filed at 01/31/12 0800  Gross per 24 hour  Intake 1021.5 ml  Output   1300 ml  Net -278.5 ml    Physical Exam: Affect appropriate Obese female HEENT: normal Neck supple with no adenopathy JVP normal no bruits no thyromegaly Lungs clear with no wheezing and good diaphragmatic motion Heart:  S1/S2 no murmur, no rub, gallop or click PMI normal Abdomen: benighn, BS positve, no tenderness, no AAA no bruit.  No HSM or HJR Distal pulses intact with no bruits No edema Neuro non-focal Skin warm and dry No muscular weakness   Lab Results: Basic Metabolic Panel:  Basename 01/30/12 0613 01/30/12 0026  NA 139 139  K 4.1 3.8  CL 99 100  CO2 29 28  GLUCOSE 120* 124*  BUN 11 12  CREATININE 0.87 0.85  CALCIUM 10.2 9.9  MG 1.7 --  PHOS -- --   Liver Function Tests:  [facility] 01/30/12 0613  AST 20  ALT 16  ALKPHOS 70  BILITOT 0.2*  PROT 6.9  ALBUMIN 3.5   No results found for this basename: LIPASE:2,AMYLASE:2 in the last 72 hours CBC:  Basename 01/31/12 0049 01/30/12 0613 01/30/12 0026  WBC 8.8 8.3 --  NEUTROABS -- 4.4 5.4  HGB 11.5* 12.3 --  HCT 36.0 38.2 --  MCV 88.9 88.0 --  PLT 290 309 --   Cardiac Enzymes:  Basename 01/30/12 1723 01/30/12 1110 01/30/12 0613  CKTOTAL 154 174 191*  CKMB 5.9* 5.8* 5.0*  CKMBINDEX -- -- --  TROPONINI 0.58* 0.66* 0.72*   BNP: No components found with this basename: POCBNP:3 D-Dimer: No results found for this basename: DDIMER:2  in the last 72 hours Hemoglobin A1C: No results found for this basename: HGBA1C in the last 72 hours Fasting Lipid Panel: No results found for this basename: CHOL,HDL,LDLCALC,TRIG,CHOLHDL,LDLDIRECT in the last 72 hours Thyroid Function Tests:  Basename 01/30/12 0613  TSH 4.936*  T4TOTAL --  T3FREE --  THYROIDAB --   Anemia Panel: No results found for this basename: VITAMINB12,FOLATE,FERRITIN,TIBC,IRON,RETICCTPCT in the last 72 hours  Imaging: Dg Chest 2 View  01/30/2012  *RADIOLOGY REPORT*  Clinical Data: Substernal chest pain; shortness of breath, nausea and diaphoresis.  CHEST - 2 VIEW  Comparison: Chest radiograph performed 08/17/2011  Findings: The lungs are well-aerated.  Mild chronic peribronchial thickening is noted.  There is no evidence of focal opacification, pleural effusion or pneumothorax.  The heart is normal in size; the mediastinal contour is within normal limits.  No acute osseous abnormalities are seen.  IMPRESSION: No acute cardiopulmonary process seen.  Original Report Authenticated By: Tonia Ghent, M.D.    Cardiac Studies:  ECG: NSR no ischemic changes 3/28   Telemetry: NSR no VT   Medications:     . albuterol  2.5 mg Nebulization Once  . antiseptic oral rinse  15 mL Mouth  Rinse BID  . aspirin  324 mg Oral NOW   Or  . aspirin  300 mg Rectal NOW  . aspirin  325 mg Oral Daily  . carvedilol  25 mg Oral BID WC  . Fluticasone-Salmeterol  1 puff Inhalation BID  . levothyroxine  125 mcg Oral QAC breakfast  . losartan  50 mg Oral Daily  . metoprolol  2.5 mg Intravenous Once  . pantoprazole  80 mg Oral Q1200  . simvastatin  20 mg Oral q1800  . DISCONTD: aspirin  81 mg Oral Daily  . DISCONTD: aspirin EC  81 mg Oral Daily       . sodium chloride 75 mL/hr at 01/30/12 0611  . sodium chloride 10 mL/hr at 01/30/12 0900  . heparin 1,400 Units/hr (01/31/12 0218)  . nitroGLYCERIN 25 mcg/min (01/30/12 1920)  . DISCONTD: sodium chloride      Assessment/Plan:    CAD:  Appreciate Dr [name] note.  CABG Monday.  Continue heparin and nitro Chol:  Continue statin HTN:  Under good control continue ARB  Pre CABG dopplers per CVTS  [name] 01/31/2012, 8:48 AM

## 2012-01-31 NOTE — Progress Notes (Signed)
Received order on admit, now in need of CABG. Please advise if ambulation warranted pre-CABG. Thx. Ethelda Chick CES, ACSM

## 2012-01-31 NOTE — Progress Notes (Signed)
ANTICOAGULATION CONSULT NOTE  Pharmacy Consult for Heparin Indication: chest pain/ACS  Allergies  Allergen Reactions  . Sulfonamide Derivatives     Patient Measurements: Height 60 inches Weight 90 kg   Vital Signs: Temp: 97.2 F (36.2 C) (03/28 0000) Temp src: Oral (03/28 0000) BP: 98/60 mmHg (03/28 0200) Pulse Rate: 71  (03/28 0200)  Labs:  Basename 01/31/12 0049 01/30/12 1723 01/30/12 1110 01/30/12 0613 01/30/12 0116 01/30/12 0026  HGB 11.5* -- -- 12.3 -- --  HCT 36.0 -- -- 38.2 -- 39.7  PLT 290 -- -- 309 -- 355  APTT -- -- -- -- 34 --  LABPROT -- -- -- 14.0 13.5 --  INR -- -- -- 1.06 1.01 --  HEPARINUNFRC 0.16* 0.13* -- -- -- --  CREATININE -- -- -- 0.87 -- 0.85  CKTOTAL -- 154 174 191* -- --  CKMB -- 5.9* 5.8* 5.0* -- --  TROPONINI -- 0.58* 0.66* 0.72* -- --   Estimated Creatinine Clearance: 70.2 ml/min (by C-G formula based on Cr of 0.87).   Assessment: 61 yo female with 3 vessel CAD awaiting CABG next week for Heparin  Goal of Therapy:  Heparin  Level 0.3-0.7   Plan:  Increase Heparin 1400 unit/hr Check heparin level in 8 hours.  Eddie Candle 01/31/2012,2:14 AM

## 2012-01-31 NOTE — Progress Notes (Addendum)
Pre-op Cardiac Surgery  Carotid Findings:  Bilaterally no significant ICA stenosis with antegrade vertebral flow.  Upper Extremity Right Left  Brachial Pressures 118T 124T  Radial Waveforms T T  Ulnar Waveforms T T  Palmar Arch (Allen's Test) Doppler signal obliterates with radial compression and reverses with ulnar compression.  Doppler signal obliterates with radial compression and reverses with ulnar compression.    Findings:      Lower  Extremity Right Left  Dorsalis Pedis    Anterior Tibial    Posterior Tibial    Ankle/Brachial Indices      Findings:  Palpable  Sherren Kerns, RVT Farrel Demark, RDMS

## 2012-01-31 NOTE — Progress Notes (Signed)
ANTICOAGULATION CONSULT NOTE - Follow Up Consult  Pharmacy Consult for Heparin Indication: chest pain/ACS  Allergies  Allergen Reactions  . Sulfonamide Derivatives     Patient Measurements: Height: 5' (152.4 cm) Weight: 206 lb 5.6 oz (93.6 kg) IBW/kg (Calculated) : 45.5  Heparin Dosing Weight: 68 kg  Vital Signs: Temp: 97.7 F (36.5 C) (03/28 1600) Temp src: Oral (03/28 1600) BP: 104/59 mmHg (03/28 1700) Pulse Rate: 79  (03/28 1600)  Labs:  Basename 01/31/12 1657 01/31/12 1046 01/31/12 0049 01/30/12 1723 01/30/12 1110 01/30/12 0613 01/30/12 0116 01/30/12 0026  HGB -- -- 11.5* -- -- 12.3 -- --  HCT -- -- 36.0 -- -- 38.2 -- 39.7  PLT -- -- 290 -- -- 309 -- 355  APTT -- -- -- -- -- -- 34 --  LABPROT -- -- -- -- -- 14.0 13.5 --  INR -- -- -- -- -- 1.06 1.01 --  HEPARINUNFRC 0.58 0.39 0.16* -- -- -- -- --  CREATININE -- -- -- -- -- 0.87 -- 0.85  CKTOTAL -- -- -- 154 174 191* -- --  CKMB -- -- -- 5.9* 5.8* 5.0* -- --  TROPONINI -- -- -- 0.58* 0.66* 0.72* -- --   Estimated Creatinine Clearance: 70.2 ml/min (by C-G formula based on Cr of 0.87).   Medications:  Heparin infusion at 1400 units/hr  Assessment: 61 yo female with 3 vessel CAD awaiting CABG next week on heparin infusion, anti-Xa level therapeutic (0.58).  Goal of Therapy:  Heparin level 0.3-0.7 units/ml   Plan:  - Continue heparin infusion at current rate - f/u am heparin level and cbc  Bayard Hugger, PharmD, BCPS, [phone]  01/31/2012,6:02 PM

## 2012-02-01 ENCOUNTER — Inpatient Hospital Stay (HOSPITAL_COMMUNITY): Payer: Self-pay

## 2012-02-01 DIAGNOSIS — I2510 Atherosclerotic heart disease of native coronary artery without angina pectoris: Secondary | ICD-10-CM

## 2012-02-01 DIAGNOSIS — I214 Non-ST elevation (NSTEMI) myocardial infarction: Secondary | ICD-10-CM

## 2012-02-01 LAB — CBC
HCT: 35.4 % — ABNORMAL LOW (ref 36.0–46.0)
MCHC: 30.8 g/dL (ref 30.0–36.0)
Platelets: 304 10*3/uL (ref 150–400)
RDW: 15.3 % (ref 11.5–15.5)

## 2012-02-01 LAB — SURGICAL PCR SCREEN: MRSA, PCR: NEGATIVE

## 2012-02-01 LAB — TYPE AND SCREEN: ABO/RH(D): O POS

## 2012-02-01 LAB — ABO/RH: ABO/RH(D): O POS

## 2012-02-01 MED ORDER — CHLORHEXIDINE GLUCONATE 4 % EX LIQD: 60.0000 mL | Freq: Once | CUTANEOUS | Status: DC

## 2012-02-01 MED ORDER — DIAZEPAM 5 MG PO TABS
5.0000 mg | ORAL_TABLET | Freq: Once | ORAL | Status: AC
  Administered 2012-02-04: 5 mg via ORAL
  Filled 2012-02-01: qty 1

## 2012-02-01 MED ORDER — BISACODYL 5 MG PO TBEC
5.0000 mg | DELAYED_RELEASE_TABLET | Freq: Once | ORAL | Status: AC
  Administered 2012-02-03: 5 mg via ORAL
  Filled 2012-02-01: qty 1

## 2012-02-01 MED ORDER — LORATADINE 10 MG PO TABS
10.0000 mg | ORAL_TABLET | Freq: Every day | ORAL | Status: DC
  Administered 2012-02-01 – 2012-02-03 (×3): 10 mg via ORAL
  Filled 2012-02-01 (×4): qty 1

## 2012-02-01 MED ORDER — METOPROLOL TARTRATE 12.5 MG HALF TABLET
12.5000 mg | ORAL_TABLET | Freq: Once | ORAL | Status: AC
  Administered 2012-02-04: 12.5 mg via ORAL
  Filled 2012-02-01 (×2): qty 1

## 2012-02-01 MED ORDER — LORAZEPAM 0.5 MG PO TABS
0.5000 mg | ORAL_TABLET | ORAL | Status: DC | PRN
  Administered 2012-02-01: 0.5 mg via ORAL
  Administered 2012-02-02 (×2): 1 mg via ORAL
  Administered 2012-02-03 (×2): 0.5 mg via ORAL
  Filled 2012-02-01: qty 2
  Filled 2012-02-01 (×3): qty 1
  Filled 2012-02-01: qty 2

## 2012-02-01 MED ORDER — TEMAZEPAM 15 MG PO CAPS: 15.0000 mg | ORAL_CAPSULE | Freq: Once | ORAL | Status: AC | PRN

## 2012-02-01 NOTE — Progress Notes (Signed)
2 Days Post-Op   Subjective: No CP or SOB Very anxious about surgery  Objective: Vital signs in last 24 hours: Temp:  [97.4 F (36.3 [name])-98.2 F (36.8 [name])] 98.1 F (36.7 [name]) (03/29 0800) Pulse Rate:  [67-84] 79  (03/28 1600) Cardiac Rhythm:  [-] Normal sinus rhythm (03/29 0800) BP: (78-127)/(37-74) 110/72 mmHg (03/29 0800) SpO2:  [91 %-100 %] 97 % (03/29 0800) FiO2 (%):  [2 %] 2 % (03/29 0800)  Hemodynamic parameters for last 24 hours:    Intake/Output from previous day: 03/28 0701 - 03/29 0700 In: 1194.5 [P.O.:480; I.V.:714.5] Out: 1450 [Urine:1450] Intake/Output this shift: Total I/O In: 151.5 [P.O.:120; I.V.:31.5] Out: 350 [Urine:350]  General appearance: alert and no distress Heart: regular rate and rhythm Lungs: clear to auscultation bilaterally  Lab Results:  Basename 02/01/12 0520 01/31/12 0049  WBC 8.6 8.8  HGB 10.9* 11.5*  HCT 35.4* 36.0  PLT 304 290   BMET:  Basename 01/30/12 0613 01/30/12 0026  NA 139 139  K 4.1 3.8  CL 99 100  CO2 29 28  GLUCOSE 120* 124*  BUN 11 12  CREATININE 0.87 0.85  CALCIUM 10.2 9.9    PT/INR:  Basename 01/30/12 0613  LABPROT 14.0  INR 1.06   ABG    Component Value Date/Time   HCO3 27.9* 06/18/2007 0557   TCO2 23 08/15/2011 2102   CBG (last 3)  No results found for this basename: GLUCAP:3 in the last 72 hours  Assessment/Plan: S/P   -3 vessel CAD, s/p NQWMI- for CABG Monday AM All questions answered perop orders written   LOS: 2 days    [name] 02/01/2012

## 2012-02-01 NOTE — Progress Notes (Signed)
Patient ID: [name], female   DOB: October 14, 1951, 61 y.o.   MRN: [id number]    @ Subjective:  Denies SSCP, palpitations or Dyspnea Nausea persists   Objective:  Filed Vitals:   02/01/12 0619 02/01/12 0800 02/01/12 1142 02/01/12 1348  BP: 117/70 110/72  82/50  Pulse:      Temp:  98.1 F (36.7 C) 97.7 F (36.5 C)   TempSrc:  Oral Oral   Resp:      Height:      Weight:      SpO2: 99% 97%  95%    Intake/Output from previous day:  Intake/Output Summary (Last 24 hours) at 02/01/12 1417 Last data filed at 02/01/12 1303  Gross per 24 hour  Intake    677 ml  Output   1600 ml  Net   -923 ml    Physical Exam: Affect appropriate Obese female HEENT: normal Neck supple with no adenopathy JVP normal no bruits no thyromegaly Lungs clear with no wheezing and good diaphragmatic motion Heart:  S1/S2 no murmur, no rub, gallop or click PMI normal Abdomen: benighn, BS positve, no tenderness, no AAA no bruit.  No HSM or HJR Distal pulses intact with no bruits No edema Neuro non-focal Skin warm and dry No muscular weakness   Lab Results: Basic Metabolic Panel:  Basename 01/30/12 0613 01/30/12 0026  NA 139 139  K 4.1 3.8  CL 99 100  CO2 29 28  GLUCOSE 120* 124*  BUN 11 12  CREATININE 0.87 0.85  CALCIUM 10.2 9.9  MG 1.7 --  PHOS -- --   Liver Function Tests:  [facility] 01/30/12 0613  AST 20  ALT 16  ALKPHOS 70  BILITOT 0.2*  PROT 6.9  ALBUMIN 3.5   No results found for this basename: LIPASE:2,AMYLASE:2 in the last 72 hours CBC:  Basename 02/01/12 0520 01/31/12 0049 01/30/12 0613 01/30/12 0026  WBC 8.6 8.8 -- --  NEUTROABS -- -- 4.4 5.4  HGB 10.9* 11.5* -- --  HCT 35.4* 36.0 -- --  MCV 90.3 88.9 -- --  PLT 304 290 -- --   Cardiac Enzymes:  Basename 01/30/12 1723 01/30/12 1110 01/30/12 0613  CKTOTAL 154 174 191*  CKMB 5.9* 5.8* 5.0*  CKMBINDEX -- -- --  TROPONINI 0.58* 0.66* 0.72*     Basename 01/30/12 0613  TSH 4.936*  T4TOTAL --  T3FREE --   THYROIDAB --   Anemia Panel: No results found for this basename: VITAMINB12,FOLATE,FERRITIN,TIBC,IRON,RETICCTPCT in the last 72 hours  Imaging: No results found.  Cardiac Studies:  ECG: NSR no ischemic changes 3/28   Telemetry: NSR no VT   Medications:      . antiseptic oral rinse  15 mL Mouth Rinse BID  . aspirin  325 mg Oral Daily  . bisacodyl  5 mg Oral Once  . carvedilol  25 mg Oral BID WC  . chlorhexidine  60 mL Topical Once  . chlorhexidine  60 mL Topical Once  . chlorhexidine  60 mL Topical Once  . diazepam  5 mg Oral Once  . Fluticasone-Salmeterol  1 puff Inhalation BID  . levothyroxine  125 mcg Oral QAC breakfast  . loratadine  10 mg Oral Daily  . losartan  50 mg Oral Daily  . metoprolol  2.5 mg Intravenous Once  . metoprolol tartrate  12.5 mg Oral Once  . pantoprazole  80 mg Oral Q1200  . simvastatin  20 mg Oral q1800        . sodium  chloride 10 mL/hr at 01/30/12 0900  . heparin 1,400 Units/hr (02/01/12 0243)  . nitroGLYCERIN 20 mcg/min (02/01/12 0606)    Assessment/Plan:  CAD:  Appreciate Dr Rondel Jumbo note.  CABG Monday.  Continue heparin and nitro Chol:  Continue statin HTN:  Under good control continue ARB Nausea:  Check KUB  LFT;s normal.  Check amylase and lipase.  No distension or tenderness on exam  Enzymes negative  Pre CABG dopplers per CVTS  [name] 02/01/2012, 2:17 PM

## 2012-02-01 NOTE — Plan of Care (Signed)
Problem: Phase II Progression Outcomes Goal: Hemodynamically stable Outcome: Completed/Met Date Met:  02/01/12 Patient's vital signs within normal limits.

## 2012-02-01 NOTE — Plan of Care (Signed)
Problem: Phase II Progression Outcomes Goal: Activity appropriate for discharge plan Outcome: Completed/Met Date Met:  02/01/12 Right groin site level 0.

## 2012-02-01 NOTE — Progress Notes (Signed)
Patient Name: [name] Date of Encounter: 02/01/2012     Principal Problem:  *NSTEMI (non-ST elevated myocardial infarction) Active Problems:  CAD (coronary artery disease)  HYPERLIPIDEMIA  HYPERTENSION  HYPOTHYROIDISM  OBESITY    SUBJECTIVE  Has had intermittent chest discomfort with any activity and also with eating.  Says she had a spell yesterday where she felt lightheaded after standing.  BP's have been soft.  CURRENT MEDS    . antiseptic oral rinse  15 mL Mouth Rinse BID  . aspirin  325 mg Oral Daily  . carvedilol  25 mg Oral BID WC  . Fluticasone-Salmeterol  1 puff Inhalation BID  . levothyroxine  125 mcg Oral QAC breakfast  . loratadine  10 mg Oral Daily  . losartan  50 mg Oral Daily  . metoprolol  2.5 mg Intravenous Once  . pantoprazole  80 mg Oral Q1200  . simvastatin  20 mg Oral q1800    OBJECTIVE  Filed Vitals:   02/01/12 0617 02/01/12 0618 02/01/12 0619 02/01/12 0800  BP: 103/63 109/74 117/70 110/72  Pulse:      Temp:    98.1 F (36.7 C)  TempSrc:    Oral  Resp:      Height:      Weight:      SpO2: 97%  99% 97%    Intake/Output Summary (Last 24 hours) at 02/01/12 1111 Last data filed at 02/01/12 0900  Gross per 24 hour  Intake    980 ml  Output   1800 ml  Net   -820 ml   Filed Weights   01/30/12 0515  Weight: 206 lb 5.6 oz (93.6 kg)    PHYSICAL EXAM  General: Pleasant, NAD. Neuro: Alert and oriented X 3. Moves all extremities spontaneously. Psych: Normal affect. HEENT:  Normal  Neck: Supple, obese without bruits or JVD. Lungs:  Resp regular and unlabored, CTA. Heart: RRR no s3, s4, or murmurs. Abdomen: Soft, non-tender, non-distended, BS + x 4.  Extremities: No clubbing, cyanosis or edema. DP/PT/Radials 2+ and equal bilaterally.  Right groin w/o bleeding, bruit, hematoma.  Accessory Clinical Findings  CBC  Basename 02/01/12 0520 01/31/12 0049 01/30/12 0613 01/30/12 0026  WBC 8.6 8.8 -- --  NEUTROABS -- -- 4.4 5.4    HGB 10.9* 11.5* -- --  HCT 35.4* 36.0 -- --  MCV 90.3 88.9 -- --  PLT 304 290 -- --   Basic Metabolic Panel  Basename 01/30/12 0613 01/30/12 0026  NA 139 139  K 4.1 3.8  CL 99 100  CO2 29 28  GLUCOSE 120* 124*  BUN 11 12  CREATININE 0.87 0.85  CALCIUM 10.2 9.9  MG 1.7 --  PHOS -- --   Liver Function Tests  Basename 01/30/12 0613  AST 20  ALT 16  ALKPHOS 70  BILITOT 0.2*  PROT 6.9  ALBUMIN 3.5   Cardiac Enzymes  Basename 01/30/12 1723 01/30/12 1110 01/30/12 0613  CKTOTAL 154 174 191*  CKMB 5.9* 5.8* 5.0*  CKMBINDEX -- -- --  TROPONINI 0.58* 0.66* 0.72*     Thyroid Function Tests  Basename 01/30/12 0613  TSH 4.936*  T4TOTAL --  T3FREE --  THYROIDAB --    TELE  RSR  ASSESSMENT AND PLAN  1.  NSTEMI/CAD:  S/p cath on 3/27 revealing 3VD.  For CABG on Monday 4/1.  She's had intermittent exertional c/p with minimal activity.  Also has had c/p surrounding meals.  Cont asa, bb, statin, heparin, iv ntg.  Soft bp's limit further titration.  2.  HTN:  Stable.  3.  HL:  On simvastatin.  She had a LDL of 147 in October 2012.  Repeat in AM and consider switching to more potent statin if LDL not @ goal.  4.  Hypothyroidism:  TSH slightly elevated.  Check FT4. Cont synthroid.  Signed, Nicolasa Ducking NP

## 2012-02-01 NOTE — Progress Notes (Signed)
ANTICOAGULATION CONSULT NOTE - Follow Up Consult  Pharmacy Consult for Heparin Indication: chest pain/ACS  Allergies  Allergen Reactions  . Sulfonamide Derivatives     Patient Measurements: Height: 5' (152.4 cm) Weight: 206 lb 5.6 oz (93.6 kg) IBW/kg (Calculated) : 45.5  Heparin Dosing Weight: 68 kg  Vital Signs: Temp: 98.1 F (36.7 C) (03/29 0800) Temp src: Oral (03/29 0800) BP: 110/72 mmHg (03/29 0800)  Labs:  Basename 02/01/12 0520 01/31/12 1657 01/31/12 1046 01/31/12 0049 01/30/12 1723 01/30/12 1110 01/30/12 0613 01/30/12 0116 01/30/12 0026  HGB 10.9* -- -- 11.5* -- -- -- -- --  HCT 35.4* -- -- 36.0 -- -- 38.2 -- --  PLT 304 -- -- 290 -- -- 309 -- --  APTT -- -- -- -- -- -- -- 34 --  LABPROT -- -- -- -- -- -- 14.0 13.5 --  INR -- -- -- -- -- -- 1.06 1.01 --  HEPARINUNFRC 0.63 0.58 0.39 -- -- -- -- -- --  CREATININE -- -- -- -- -- -- 0.87 -- 0.85  CKTOTAL -- -- -- -- 154 174 191* -- --  CKMB -- -- -- -- 5.9* 5.8* 5.0* -- --  TROPONINI -- -- -- -- 0.58* 0.66* 0.72* -- --   Estimated Creatinine Clearance: 70.2 ml/min (by C-G formula based on Cr of 0.87).   Medications:  Heparin infusion at 1400 units/hr  Assessment: 61 yo female with 3 vessel CAD awaiting CABG Monday. Currently on heparin infusion, anti-Xa level therapeutic (0.63). No bleeding issues have been noted. Will continue current rate, if it continues to trend up will decrease rate in am.  Goal of Therapy:  Heparin level 0.3-0.7 units/ml   Plan:  - Continue heparin infusion at current rate - f/u am heparin level and cbc  Sheppard Coil PharmD. 02/01/2012,10:35 AM

## 2012-02-02 LAB — CBC
HCT: 36 % (ref 36.0–46.0)
Platelets: 296 10*3/uL (ref 150–400)
RDW: 15.1 % (ref 11.5–15.5)
WBC: 7.3 10*3/uL (ref 4.0–10.5)

## 2012-02-02 LAB — T4, FREE: Free T4: 1.23 ng/dL (ref 0.80–1.80)

## 2012-02-02 LAB — HEPARIN LEVEL (UNFRACTIONATED): Heparin Unfractionated: 0.56 IU/mL (ref 0.30–0.70)

## 2012-02-02 LAB — BASIC METABOLIC PANEL
GFR calc Af Amer: 78 mL/min — ABNORMAL LOW (ref >90)
GFR calc non Af Amer: 67 mL/min — ABNORMAL LOW (ref >90)
Glucose, Bld: 106 mg/dL — ABNORMAL HIGH (ref 70–99)
Potassium: 3.6 mEq/L (ref 3.5–5.1)
Sodium: 140 mEq/L (ref 135–145)

## 2012-02-02 LAB — LIPID PANEL: LDL Cholesterol: 99 mg/dL (ref 0–99)

## 2012-02-02 LAB — AMYLASE: Amylase: 45 U/L (ref 0–105)

## 2012-02-02 LAB — LIPASE, BLOOD: Lipase: 14 U/L (ref 11–59)

## 2012-02-02 MED ORDER — CHLORHEXIDINE GLUCONATE 4 % EX LIQD
60.0000 mL | Freq: Once | CUTANEOUS | Status: AC
  Administered 2012-02-04: 4 via TOPICAL
  Filled 2012-02-02 (×2): qty 60

## 2012-02-02 MED ORDER — CHLORHEXIDINE GLUCONATE 4 % EX LIQD
60.0000 mL | Freq: Once | CUTANEOUS | Status: AC
  Administered 2012-02-03: 4 via TOPICAL

## 2012-02-02 NOTE — Progress Notes (Signed)
Subjective:  No significant change. Still with nausea, "indigestion, chest burning" as she was having all week (angina likely). NTG IV with HA.   Objective:  Vital Signs in the last 24 hours: Temp:  [97.6 F (36.4 C)-98.5 F (36.9 C)] 98.2 F (36.8 C) (03/30 0738) Pulse Rate:  [75-78] 75  (03/30 0331) Resp:  [16-20] 16  (03/30 0738) BP: (82-119)/(21-73) 119/21 mmHg (03/30 0830) SpO2:  [92 %-100 %] 95 % (03/30 0847)  Intake/Output from previous day: 03/29 0701 - 03/30 0700 In: 856.5 [P.O.:120; I.V.:736.5] Out: 2200 [Urine:2200]   Physical Exam: General: Well developed, well nourished, in no acute distress. In bed with rag over forehead.  Head:  Normocephalic and atraumatic. Lungs: Clear to auscultation and percussion. Heart: Normal S1 and S2.  No murmur, rubs or gallops.  Pulses: Pulses normal distal extremities. Cath site C/D/I Abdomen: soft, non-tender, positive bowel sounds. Obese Extremities: No clubbing or cyanosis. No edema. Neurologic: Alert and oriented x 3.    Lab Results:  Basename 02/02/12 0600 02/01/12 0520  WBC 7.3 8.6  HGB 11.1* 10.9*  PLT 296 304    Basename 02/02/12 0600  NA 140  K 3.6  CL 100  CO2 30  GLUCOSE 106*  BUN 9  CREATININE 0.91    Basename 01/30/12 1723 01/30/12 1110  TROPONINI 0.58* 0.66*    Imaging: Dg Abd 1 View  02/01/2012  *RADIOLOGY REPORT*  Clinical Data: Persistent nausea  ABDOMEN - 1 VIEW  Comparison: CT scan 04/27/2009  Findings: Study is limited by patient's large body habitus.  There is nonspecific nonobstructive bowel gas pattern.  Surgical clips are noted within pelvis.  Post cholecystectomy surgical clips are noted.  Degenerative changes lumbar spine. Some stool noted proximal colon.  IMPRESSION: Nonspecific nonobstructive bowel gas pattern.  Degenerative changes lumbar spine.  Original Report Authenticated By: Natasha Mead, M.D.   Personally viewed.   Telemetry: No VT Personally viewed.   EKG:  NSR, PRWP  Cardiac  Studies:    Assessment/Plan:  NSTEMI (non-ST elevated myocardial infarction)  - Cath 3/27 - severe 3 v disease  - CABG Monday  - Symptoms of nausea persist.   - KUB reassuring  - IV heparin   HYPOTHYROIDISM  - synthroid .  - TSH mildly elevated 4.9  - will check Free T4.    HYPERLIPIDEMIA  - simvastatin 20mg   - will check FLP, Goal LDL <70   OBESITY  - weight loss   HYPERTENSION  -   CAD (coronary artery disease)  - as above  - CABG   NAUSEA  - phenergan  - KUB OK    [name] 02/02/2012, 9:43 AM

## 2012-02-02 NOTE — Progress Notes (Signed)
ANTICOAGULATION CONSULT NOTE - Follow Up Consult  Pharmacy Consult for Heparin Indication: chest pain/ACS  Allergies  Allergen Reactions  . Sulfonamide Derivatives     Patient Measurements: Height: 5' (152.4 cm) Weight: 206 lb 5.6 oz (93.6 kg) IBW/kg (Calculated) : 45.5  Heparin Dosing Weight: 68 kg  Vital Signs: Temp: 98.5 F (36.9 C) (03/30 0331) Temp src: Oral (03/30 0331) BP: 119/66 mmHg (03/30 0600) Pulse Rate: 75  (03/30 0331)  Labs:  Basename 02/02/12 0600 02/01/12 0520 01/31/12 1657 01/31/12 0049 01/30/12 1723 01/30/12 1110  HGB 11.1* 10.9* -- -- -- --  HCT 36.0 35.4* -- 36.0 -- --  PLT 296 304 -- 290 -- --  APTT -- -- -- -- -- --  LABPROT -- -- -- -- -- --  INR -- -- -- -- -- --  HEPARINUNFRC 0.56 0.63 0.58 -- -- --  CREATININE -- -- -- -- -- --  CKTOTAL -- -- -- -- 154 174  CKMB -- -- -- -- 5.9* 5.8*  TROPONINI -- -- -- -- 0.58* 0.66*   Estimated Creatinine Clearance: 70.2 ml/min (by C-G formula based on Cr of 0.87).   Medications:  Heparin infusion at 1400 units/hr  Assessment: 61 yo female with 3 vessel CAD awaiting CABG Monday. Currently on heparin infusion, anti-Xa level therapeutic (0.56). No bleeding issues have been noted. Will continue current rate  Goal of Therapy:  Heparin level 0.3-0.7 units/ml   Plan:  - Continue heparin infusion at current rate of 1400 units/hr (14 ml/hr) - f/u am heparin level and cbc  Janace Litten, PharmD. [phone] 02/02/2012,7:23 AM

## 2012-02-03 LAB — LIPID PANEL
Cholesterol: 175 mg/dL (ref 0–200)
Total CHOL/HDL Ratio: 4.4 RATIO
Triglycerides: 214 mg/dL — ABNORMAL HIGH (ref <150)
VLDL: 43 mg/dL — ABNORMAL HIGH (ref 0–40)

## 2012-02-03 LAB — CBC
HCT: 34.5 % — ABNORMAL LOW (ref 36.0–46.0)
Hemoglobin: 10.8 g/dL — ABNORMAL LOW (ref 12.0–15.0)
MCV: 89.8 fL (ref 78.0–100.0)
RBC: 3.84 MIL/uL — ABNORMAL LOW (ref 3.87–5.11)
WBC: 9.1 10*3/uL (ref 4.0–10.5)

## 2012-02-03 LAB — T4, FREE: Free T4: 1.18 ng/dL (ref 0.80–1.80)

## 2012-02-03 MED ORDER — DEXTROSE 5 % IV SOLN
750.0000 mg | INTRAVENOUS | Status: DC
  Filled 2012-02-03: qty 750

## 2012-02-03 MED ORDER — ATORVASTATIN CALCIUM 80 MG PO TABS
80.0000 mg | ORAL_TABLET | Freq: Every day | ORAL | Status: DC
  Administered 2012-02-03: 80 mg via ORAL
  Filled 2012-02-03 (×2): qty 1

## 2012-02-03 MED ORDER — DEXTROSE 5 % IV SOLN
0.5000 ug/min | INTRAVENOUS | Status: DC
  Filled 2012-02-03: qty 4

## 2012-02-03 MED ORDER — SODIUM CHLORIDE 0.9 % IV SOLN
0.1000 ug/kg/h | INTRAVENOUS | Status: AC
  Administered 2012-02-04: .2 ug/kg/h via INTRAVENOUS
  Filled 2012-02-03: qty 4

## 2012-02-03 MED ORDER — NITROGLYCERIN IN D5W 200-5 MCG/ML-% IV SOLN
2.0000 ug/min | INTRAVENOUS | Status: DC
  Filled 2012-02-03: qty 250

## 2012-02-03 MED ORDER — DOPAMINE-DEXTROSE 3.2-5 MG/ML-% IV SOLN
2.0000 ug/kg/min | INTRAVENOUS | Status: AC
  Administered 2012-02-04: 5 ug/kg/min via INTRAVENOUS
  Filled 2012-02-03: qty 250

## 2012-02-03 MED ORDER — SODIUM CHLORIDE 0.9 % IV SOLN
INTRAVENOUS | Status: AC
  Administered 2012-02-04: 70 mL/h via INTRAVENOUS
  Filled 2012-02-03: qty 40

## 2012-02-03 MED ORDER — DEXTROSE 5 % IV SOLN
1.5000 g | INTRAVENOUS | Status: AC
  Administered 2012-02-04: 1.5 g via INTRAVENOUS
  Filled 2012-02-03: qty 1.5

## 2012-02-03 MED ORDER — POTASSIUM CHLORIDE 2 MEQ/ML IV SOLN
80.0000 meq | INTRAVENOUS | Status: DC
  Filled 2012-02-03: qty 40

## 2012-02-03 MED ORDER — PHENYLEPHRINE HCL 10 MG/ML IJ SOLN
30.0000 ug/min | INTRAMUSCULAR | Status: DC
  Administered 2012-02-04: 13 ug/min via INTRAVENOUS
  Filled 2012-02-03: qty 2

## 2012-02-03 MED ORDER — MAGNESIUM SULFATE 50 % IJ SOLN
40.0000 meq | INTRAMUSCULAR | Status: DC
  Filled 2012-02-03: qty 10

## 2012-02-03 MED ORDER — VERAPAMIL HCL 2.5 MG/ML IV SOLN
INTRAVENOUS | Status: AC
  Administered 2012-02-04: 09:00:00
  Filled 2012-02-03: qty 2.5

## 2012-02-03 MED ORDER — VANCOMYCIN HCL 1000 MG IV SOLR
1500.0000 mg | INTRAVENOUS | Status: AC
  Administered 2012-02-04: 1500 mg via INTRAVENOUS
  Filled 2012-02-03: qty 1500

## 2012-02-03 MED ORDER — SODIUM CHLORIDE 0.9 % IV SOLN
INTRAVENOUS | Status: AC
  Administered 2012-02-04: 2 [IU]/h via INTRAVENOUS
  Filled 2012-02-03: qty 1

## 2012-02-03 NOTE — Progress Notes (Signed)
Pt. Experienced 6/10 chest discomfort, nausea, and anxiety post bathing. No change in vital signs or ECG tracing observed. Discomfort  improved with rest, titration of nitroglycerin gtt and administration of prn Morphine, xanax, and phenergan.  Patient now resting comfortably; skin warm/dry/pink, and vital signs remain within acceptable parameters.

## 2012-02-03 NOTE — Progress Notes (Signed)
ANTICOAGULATION CONSULT NOTE - Follow Up Consult  Pharmacy Consult for Heparin Indication: chest pain/ACS  Allergies  Allergen Reactions  . Sulfonamide Derivatives     Patient Measurements: Height: 5' (152.4 cm) Weight: 207 lb 0.2 oz (93.9 kg) IBW/kg (Calculated) : 45.5  Heparin Dosing Weight: 68 kg  Vital Signs: Temp: 98.2 F (36.8 C) (03/31 0400) Temp src: Oral (03/31 0400) BP: 107/60 mmHg (03/31 0700) Pulse Rate: 80  (03/31 0400)  Labs:  Basename 02/03/12 0510 02/02/12 0600 02/01/12 0520  HGB 10.8* 11.1* --  HCT 34.5* 36.0 35.4*  PLT 292 296 304  APTT -- -- --  LABPROT -- -- --  INR -- -- --  HEPARINUNFRC 0.45 0.56 0.63  CREATININE -- 0.91 --  CKTOTAL -- -- --  CKMB -- -- --  TROPONINI -- -- --   Estimated Creatinine Clearance: 67.4 ml/min (by C-G formula based on Cr of 0.91).   Medications:  Heparin infusion at 1400 units/hr  Assessment: 61 yo female with 3 vessel CAD awaiting CABG Monday. Currently on heparin infusion, anti-Xa level therapeutic (0.45). No bleeding issues have been noted. Will continue current rate  Goal of Therapy:  Heparin level 0.3-0.7 units/ml   Plan:  - Continue heparin infusion at current rate of 1400 units/hr (14 ml/hr) - f/u am heparin level and cbc - plan for CABG 4/1  Janace Litten, PharmD. [phone] 02/03/2012,8:12 AM

## 2012-02-03 NOTE — Progress Notes (Signed)
Subjective:  Had episode while washing this am of epigastric/CP. Increased NTG IV, morphine, phenergan. Xanax Anxious.   Objective:  Vital Signs in the last 24 hours: Temp:  [98.2 F (36.8 C)-99.2 F (37.3 C)] 98.2 F (36.8 C) (03/31 0400) Pulse Rate:  [76-81] 80  (03/31 0400) BP: (85-117)/(46-68) 93/57 mmHg (03/31 1000) SpO2:  [95 %-100 %] 98 % (03/31 1000) Weight:  [93.9 kg (207 lb 0.2 oz)] 93.9 kg (207 lb 0.2 oz) (03/31 0400)  Intake/Output from previous day: 03/30 0701 - 03/31 0700 In: 2680 [P.O.:1960; I.V.:720] Out: 1750 [Urine:1750]   Physical Exam: General: Well developed, well nourished, in no acute distress. In bed with rag over forehead.  Head: Normocephalic and atraumatic.  Lungs: Clear to auscultation and percussion.  Heart: Normal S1 and S2. Soft S murmur RUSB.  Pulses: Pulses normal distal extremities. Cath site C/D/I  Abdomen: soft, non-tender, positive bowel sounds. Obese  Extremities: No clubbing or cyanosis. No edema.  Neurologic: Alert and oriented x 3.     Lab Results:  Basename 02/03/12 0510 02/02/12 0600  WBC 9.1 7.3  HGB 10.8* 11.1*  PLT 292 296    Basename 02/02/12 0600  NA 140  K 3.6  CL 100  CO2 30  GLUCOSE 106*  BUN 9  CREATININE 0.91     Basename 02/03/12 0510  CHOL 175     Imaging: Dg Abd 1 View  02/01/2012  *RADIOLOGY REPORT*  Clinical Data: Persistent nausea  ABDOMEN - 1 VIEW  Comparison: CT scan 04/27/2009  Findings: Study is limited by patient's large body habitus.  There is nonspecific nonobstructive bowel gas pattern.  Surgical clips are noted within pelvis.  Post cholecystectomy surgical clips are noted.  Degenerative changes lumbar spine. Some stool noted proximal colon.  IMPRESSION: Nonspecific nonobstructive bowel gas pattern.  Degenerative changes lumbar spine.  Original Report Authenticated By: Natasha Mead, M.D.   Personally viewed.   Telemetry: No VT Personally viewed.   Cardiac Studies:  Cath 3/27 - severe 3v  CAD, EF 45% mild inferior wall hypokinesis  ECHO: - Left ventricle: The cavity size was normal. Wall thickness was increased in a pattern of mild LVH. Systolic function was normal. The estimated ejection fraction was in the range of 55% to 60%. Basal inferior severe hypokinesis. Doppler parameters are consistent with abnormal left ventricular relaxation (grade 1 diastolic dysfunction). - Aortic valve: Trileaflet; moderately calcified leaflets. There was mild stenosis. Mean gradient: 11mm Hg (S). Valve area: 1.61cm^2(VTI). - Mitral valve: Mildly calcified annulus. No significant regurgitation. - Left atrium: The atrium was mildly dilated. - Right ventricle: The cavity size was normal. Systolic function was normal. - Pulmonary arteries: No complete TR doppler jet so unable to estimate PA systolic pressure. - Inferior vena cava: The vessel was normal in size; the respirophasic diameter changes were in the normal range (= 50%); findings are consistent with normal central venous pressure. - Pericardium, extracardiac: A trivial pericardial effusion was identified posterior to the heart. Impressions:  - Normal LV size with mild LV hypertrophy. There is basal inferior severe hypokinesis but overall systolic function preserved, EF 55-60%. Mild aortic stenosis. Normal RV size and systolic function.  Assessment/Plan:   NSTEMI (non-ST elevated myocardial infarction)  - Cath 3/27 - severe 3 v disease  - EF 45% on cath, normal on ECHO with severe inferior hypo. - CABG Monday  - Symptoms of nausea persist.  - KUB reassuring  - IV heparin   HYPOTHYROIDISM  - synthroid .  -  TSH mildly elevated 4.9 but Free T4 is normal.   HYPERLIPIDEMIA  - on simvastatin 20mg , I will change to atorvastatin 80mg .  -LDL 99, goal 70  OBESITY  - weight loss   HYPERTENSION  - stable  CAD (coronary artery disease)  - as above  - CABG   NAUSEA  - phenergan, ?ischemia - KUB OK        Lillian Tigges 02/03/2012, 11:03 AM

## 2012-02-03 NOTE — Progress Notes (Signed)
4 Days Post-Op   Subjective: Brief chest/epigastic discomfort this am relieved with increased NTG and morphine.  Objective: Vital signs in last 24 hours: Temp:  [98.2 F (36.8 C)-99.2 F (37.3 C)] 98.6 F (37 C) (03/31 1600) Pulse Rate:  [76-81] 80  (03/31 0400) Cardiac Rhythm:  [-] Normal sinus rhythm (03/31 1600) BP: (85-114)/(46-71) 98/52 mmHg (03/31 1625) SpO2:  [95 %-100 %] 97 % (03/31 1625) Weight:  [93.9 kg (207 lb 0.2 oz)] 93.9 kg (207 lb 0.2 oz) (03/31 0400)  Hemodynamic parameters for last 24 hours:    Intake/Output from previous day: 03/30 0701 - 03/31 0700 In: 2680 [P.O.:1960; I.V.:720] Out: 1750 [Urine:1750] Intake/Output this shift: Total I/O In: 258 [I.V.:258] Out: 600 [Urine:600]   Lab Results:  Basename 02/03/12 0510 02/02/12 0600  WBC 9.1 7.3  HGB 10.8* 11.1*  HCT 34.5* 36.0  PLT 292 296   BMET:  Basename 02/02/12 0600  NA 140  K 3.6  CL 100  CO2 30  GLUCOSE 106*  BUN 9  CREATININE 0.91  CALCIUM 9.0    PT/INR: No results found for this basename: LABPROT,INR in the last 72 hours ABG    Component Value Date/Time   HCO3 27.9* 06/18/2007 0557   TCO2 23 08/15/2011 2102   CBG (last 3)  No results found for this basename: GLUCAP:3 in the last 72 hours  Assessment/Plan: Stable for CABG in am by Dr. Dorris Fetch   LOS: 4 days    [name] 02/03/2012

## 2012-02-04 ENCOUNTER — Encounter (HOSPITAL_COMMUNITY): Payer: Self-pay | Admitting: Anesthesiology

## 2012-02-04 ENCOUNTER — Inpatient Hospital Stay (HOSPITAL_COMMUNITY): Payer: Self-pay | Admitting: Anesthesiology

## 2012-02-04 ENCOUNTER — Encounter (HOSPITAL_COMMUNITY)
Admission: EM | Disposition: A | Discharge status: Home / Self Care | Payer: Self-pay | Source: Home / Self Care | Attending: Thoracic Surgery (Cardiothoracic Vascular Surgery)

## 2012-02-04 ENCOUNTER — Inpatient Hospital Stay (HOSPITAL_COMMUNITY): Payer: Self-pay

## 2012-02-04 DIAGNOSIS — I2510 Atherosclerotic heart disease of native coronary artery without angina pectoris: Secondary | ICD-10-CM

## 2012-02-04 HISTORY — PX: CORONARY ARTERY BYPASS GRAFT: SHX141

## 2012-02-04 LAB — CBC
HCT: 27.6 % — ABNORMAL LOW (ref 36.0–46.0)
Hemoglobin: 8.8 g/dL — ABNORMAL LOW (ref 12.0–15.0)
Hemoglobin: 9.1 g/dL — ABNORMAL LOW (ref 12.0–15.0)
MCH: 27.7 pg (ref 26.0–34.0)
MCH: 27.9 pg (ref 26.0–34.0)
MCHC: 31 g/dL (ref 30.0–36.0)
MCHC: 31.9 g/dL (ref 30.0–36.0)
MCHC: 31.5 g/dL (ref 30.0–36.0)
MCV: 89.4 fL (ref 78.0–100.0)
Platelets: 318 10*3/uL (ref 150–400)
Platelets: 197 10*3/uL (ref 150–400)
RDW: 15 % (ref 11.5–15.5)
RDW: 14.9 % (ref 11.5–15.5)

## 2012-02-04 LAB — POCT I-STAT 3, ART BLOOD GAS (G3+)
Acid-Base Excess: 3 mmol/L — ABNORMAL HIGH (ref 0.0–2.0)
Acid-base deficit: 2 mmol/L (ref 0.0–2.0)
Acid-base deficit: 3 mmol/L — ABNORMAL HIGH (ref 0.0–2.0)
Acid-base deficit: 3 mmol/L — ABNORMAL HIGH (ref 0.0–2.0)
Bicarbonate: 29.4 mEq/L — ABNORMAL HIGH (ref 20.0–24.0)
Bicarbonate: 26.8 mEq/L — ABNORMAL HIGH (ref 20.0–24.0)
Bicarbonate: 21.3 mEq/L (ref 20.0–24.0)
O2 Saturation: 95 %
O2 Saturation: 98 %
Patient temperature: 37.2
Patient temperature: 37.4
TCO2: 31 mmol/L (ref 0–100)
TCO2: 28 mmol/L (ref 0–100)
TCO2: 22 mmol/L (ref 0–100)
TCO2: 24 mmol/L (ref 0–100)
pCO2 arterial: 30.3 mmHg — ABNORMAL LOW (ref 35.0–45.0)
pCO2 arterial: 39 mmHg (ref 35.0–45.0)
pCO2 arterial: 34.9 mmHg — ABNORMAL LOW (ref 35.0–45.0)
pH, Arterial: 7.575 — ABNORMAL HIGH (ref 7.350–7.400)
pH, Arterial: 7.555 — ABNORMAL HIGH (ref 7.350–7.400)
pH, Arterial: 7.346 — ABNORMAL LOW (ref 7.350–7.400)
pO2, Arterial: 307 mmHg — ABNORMAL HIGH (ref 80.0–100.0)
pO2, Arterial: 144 mmHg — ABNORMAL HIGH (ref 80.0–100.0)
pO2, Arterial: 71 mmHg — ABNORMAL LOW (ref 80.0–100.0)

## 2012-02-04 LAB — PLATELET COUNT: Platelets: 225 10*3/uL (ref 150–400)

## 2012-02-04 LAB — POCT I-STAT 4, (NA,K, GLUC, HGB,HCT)
Glucose, Bld: 126 mg/dL — ABNORMAL HIGH (ref 70–99)
Glucose, Bld: 140 mg/dL — ABNORMAL HIGH (ref 70–99)
Glucose, Bld: 117 mg/dL — ABNORMAL HIGH (ref 70–99)
Glucose, Bld: 157 mg/dL — ABNORMAL HIGH (ref 70–99)
Glucose, Bld: 159 mg/dL — ABNORMAL HIGH (ref 70–99)
HCT: 24 % — ABNORMAL LOW (ref 36.0–46.0)
HCT: 24 % — ABNORMAL LOW (ref 36.0–46.0)
Hemoglobin: 11.2 g/dL — ABNORMAL LOW (ref 12.0–15.0)
Hemoglobin: 8.2 g/dL — ABNORMAL LOW (ref 12.0–15.0)
Hemoglobin: 8.2 g/dL — ABNORMAL LOW (ref 12.0–15.0)
Hemoglobin: 9.2 g/dL — ABNORMAL LOW (ref 12.0–15.0)
Potassium: 4.8 mEq/L (ref 3.5–5.1)
Potassium: 4 mEq/L (ref 3.5–5.1)
Potassium: 3.6 mEq/L (ref 3.5–5.1)
Sodium: 137 mEq/L (ref 135–145)
Sodium: 137 mEq/L (ref 135–145)
Sodium: 137 mEq/L (ref 135–145)

## 2012-02-04 LAB — POCT I-STAT, CHEM 8
HCT: 26 % — ABNORMAL LOW (ref 36.0–46.0)
Hemoglobin: 8.8 g/dL — ABNORMAL LOW (ref 12.0–15.0)
Potassium: 5.3 mEq/L — ABNORMAL HIGH (ref 3.5–5.1)
Sodium: 137 mEq/L (ref 135–145)
TCO2: 22 mmol/L (ref 0–100)

## 2012-02-04 LAB — CREATININE, SERUM: Creatinine, Ser: 0.84 mg/dL (ref 0.50–1.10)

## 2012-02-04 LAB — MAGNESIUM: Magnesium: 3.5 mg/dL — ABNORMAL HIGH (ref 1.5–2.5)

## 2012-02-04 LAB — HEMOGLOBIN AND HEMATOCRIT, BLOOD: Hemoglobin: 7.9 g/dL — ABNORMAL LOW (ref 12.0–15.0)

## 2012-02-04 SURGERY — CORONARY ARTERY BYPASS GRAFTING (CABG)
Anesthesia: General | Site: Chest | Wound class: Clean

## 2012-02-04 MED ORDER — BISACODYL 5 MG PO TBEC
10.0000 mg | DELAYED_RELEASE_TABLET | Freq: Every day | ORAL | Status: DC
  Administered 2012-02-05 – 2012-02-07 (×3): 10 mg via ORAL
  Filled 2012-02-04 (×4): qty 2
  Filled 2012-02-04: qty 1

## 2012-02-04 MED ORDER — ASPIRIN EC 325 MG PO TBEC
325.0000 mg | DELAYED_RELEASE_TABLET | Freq: Every day | ORAL | Status: DC
  Administered 2012-02-05 – 2012-02-10 (×5): 325 mg via ORAL
  Filled 2012-02-04 (×6): qty 1

## 2012-02-04 MED ORDER — METOPROLOL TARTRATE 1 MG/ML IV SOLN: 2.5000 mg | INTRAVENOUS | Status: DC | PRN

## 2012-02-04 MED ORDER — PROPOFOL 10 MG/ML IV EMUL
INTRAVENOUS | Status: DC | PRN
  Administered 2012-02-04: 60 mg via INTRAVENOUS

## 2012-02-04 MED ORDER — LACTATED RINGERS IV SOLN
INTRAVENOUS | Status: DC | PRN
  Administered 2012-02-04 (×2): via INTRAVENOUS

## 2012-02-04 MED ORDER — POTASSIUM CHLORIDE 10 MEQ/50ML IV SOLN: 10.0000 meq | INTRAVENOUS | Status: DC

## 2012-02-04 MED ORDER — HEPARIN SODIUM (PORCINE) 1000 UNIT/ML IJ SOLN
INTRAMUSCULAR | Status: DC | PRN
  Administered 2012-02-04: 2000 [IU] via INTRAVENOUS
  Administered 2012-02-04: 44000 [IU] via INTRAVENOUS

## 2012-02-04 MED ORDER — DOCUSATE SODIUM 100 MG PO CAPS
200.0000 mg | ORAL_CAPSULE | Freq: Every day | ORAL | Status: DC
  Administered 2012-02-05 – 2012-02-07 (×3): 200 mg via ORAL
  Filled 2012-02-04 (×6): qty 2

## 2012-02-04 MED ORDER — VANCOMYCIN HCL 1000 MG IV SOLR
1000.0000 mg | Freq: Once | INTRAVENOUS | Status: AC
  Administered 2012-02-04: 1000 mg via INTRAVENOUS
  Filled 2012-02-04: qty 1000

## 2012-02-04 MED ORDER — ALBUMIN HUMAN 5 % IV SOLN
INTRAVENOUS | Status: DC | PRN
  Administered 2012-02-04 (×3): via INTRAVENOUS

## 2012-02-04 MED ORDER — SODIUM CHLORIDE 0.9 % IJ SOLN
3.0000 mL | Freq: Two times a day (BID) | INTRAMUSCULAR | Status: DC
  Administered 2012-02-05 – 2012-02-06 (×3): 3 mL via INTRAVENOUS

## 2012-02-04 MED ORDER — CEFUROXIME SODIUM 750 MG IJ SOLR
INTRAMUSCULAR | Status: DC | PRN
  Administered 2012-02-04: 750 mg via INTRAMUSCULAR

## 2012-02-04 MED ORDER — DEXTROSE 5 % IV SOLN
1.5000 g | Freq: Two times a day (BID) | INTRAVENOUS | Status: DC
  Administered 2012-02-05 – 2012-02-06 (×4): 1.5 g via INTRAVENOUS
  Filled 2012-02-04 (×4): qty 1.5

## 2012-02-04 MED ORDER — SUCCINYLCHOLINE CHLORIDE 20 MG/ML IJ SOLN
INTRAMUSCULAR | Status: DC | PRN
  Administered 2012-02-04: 160 mg via INTRAVENOUS

## 2012-02-04 MED ORDER — ACETAMINOPHEN 160 MG/5ML PO SOLN
975.0000 mg | Freq: Four times a day (QID) | ORAL | Status: AC
Start: 2012-02-04 — End: 2012-02-09
  Filled 2012-02-04: qty 40.6

## 2012-02-04 MED ORDER — HEMOSTATIC AGENTS (NO CHARGE) OPTIME: TOPICAL | Status: DC | PRN

## 2012-02-04 MED ORDER — PHENYLEPHRINE HCL 10 MG/ML IJ SOLN
0.0000 ug/min | INTRAVENOUS | Status: DC
  Administered 2012-02-04: 10 ug/min via INTRAVENOUS
  Filled 2012-02-04: qty 2

## 2012-02-04 MED ORDER — SODIUM CHLORIDE 0.9 % IV SOLN
INTRAVENOUS | Status: DC | PRN
  Administered 2012-02-04: 13:00:00 via INTRAVENOUS

## 2012-02-04 MED ORDER — MORPHINE SULFATE 4 MG/ML IJ SOLN
2.0000 mg | INTRAMUSCULAR | Status: DC | PRN
  Administered 2012-02-04 – 2012-02-06 (×8): 4 mg via INTRAVENOUS
  Filled 2012-02-04 (×9): qty 1

## 2012-02-04 MED ORDER — SODIUM CHLORIDE 0.9 % IV SOLN
INTRAVENOUS | Status: DC
  Administered 2012-02-04: 8.3 [IU]/h via INTRAVENOUS
  Filled 2012-02-04 (×2): qty 1

## 2012-02-04 MED ORDER — SODIUM CHLORIDE 0.9 % IV SOLN
1.0000 g/h | Freq: Once | INTRAVENOUS | Status: DC
  Filled 2012-02-04: qty 20

## 2012-02-04 MED ORDER — ONDANSETRON HCL 4 MG/2ML IJ SOLN
4.0000 mg | Freq: Four times a day (QID) | INTRAMUSCULAR | Status: DC | PRN
  Administered 2012-02-05 – 2012-02-06 (×7): 4 mg via INTRAVENOUS
  Filled 2012-02-04 (×7): qty 2

## 2012-02-04 MED ORDER — SODIUM CHLORIDE 0.9 % IJ SOLN: 3.0000 mL | INTRAMUSCULAR | Status: DC | PRN

## 2012-02-04 MED ORDER — MIDAZOLAM HCL 2 MG/2ML IJ SOLN: 2.0000 mg | INTRAMUSCULAR | Status: DC | PRN

## 2012-02-04 MED ORDER — MORPHINE SULFATE 2 MG/ML IJ SOLN: 1.0000 mg | INTRAMUSCULAR | Status: AC | PRN

## 2012-02-04 MED ORDER — PANTOPRAZOLE SODIUM 40 MG PO TBEC
40.0000 mg | DELAYED_RELEASE_TABLET | Freq: Every day | ORAL | Status: DC
  Administered 2012-02-06 – 2012-02-09 (×4): 40 mg via ORAL
  Filled 2012-02-04 (×4): qty 1

## 2012-02-04 MED ORDER — HEMOSTATIC AGENTS (NO CHARGE) OPTIME
TOPICAL | Status: DC | PRN
  Administered 2012-02-04: 1 via TOPICAL

## 2012-02-04 MED ORDER — METOPROLOL TARTRATE 12.5 MG HALF TABLET
12.5000 mg | ORAL_TABLET | Freq: Two times a day (BID) | ORAL | Status: DC
  Filled 2012-02-04 (×3): qty 1

## 2012-02-04 MED ORDER — LIDOCAINE HCL (CARDIAC) 20 MG/ML IV SOLN
INTRAVENOUS | Status: DC | PRN
  Administered 2012-02-04: 100 mg via INTRAVENOUS

## 2012-02-04 MED ORDER — MAGNESIUM SULFATE 40 MG/ML IJ SOLN
4.0000 g | Freq: Once | INTRAMUSCULAR | Status: AC
  Administered 2012-02-04: 4 g via INTRAVENOUS
  Filled 2012-02-04: qty 100

## 2012-02-04 MED ORDER — SODIUM CHLORIDE 0.45 % IV SOLN
INTRAVENOUS | Status: DC
  Administered 2012-02-04: 14:00:00 via INTRAVENOUS

## 2012-02-04 MED ORDER — ASPIRIN 81 MG PO CHEW
324.0000 mg | CHEWABLE_TABLET | Freq: Every day | ORAL | Status: DC
  Administered 2012-02-06: 324 mg
  Filled 2012-02-04: qty 4

## 2012-02-04 MED ORDER — FAMOTIDINE IN NACL 20-0.9 MG/50ML-% IV SOLN
20.0000 mg | Freq: Two times a day (BID) | INTRAVENOUS | Status: AC
  Administered 2012-02-04: 20 mg via INTRAVENOUS
  Filled 2012-02-04: qty 50

## 2012-02-04 MED ORDER — 0.9 % SODIUM CHLORIDE (POUR BTL) OPTIME
TOPICAL | Status: DC | PRN
  Administered 2012-02-04: 6000 mL

## 2012-02-04 MED ORDER — FENTANYL CITRATE 0.05 MG/ML IJ SOLN
INTRAMUSCULAR | Status: DC | PRN
  Administered 2012-02-04: 100 ug via INTRAVENOUS
  Administered 2012-02-04 (×2): 50 ug via INTRAVENOUS
  Administered 2012-02-04: 450 ug via INTRAVENOUS
  Administered 2012-02-04: 100 ug via INTRAVENOUS
  Administered 2012-02-04: 50 ug via INTRAVENOUS
  Administered 2012-02-04: 100 ug via INTRAVENOUS
  Administered 2012-02-04: 550 ug via INTRAVENOUS

## 2012-02-04 MED ORDER — POTASSIUM CHLORIDE 10 MEQ/50ML IV SOLN
10.0000 meq | INTRAVENOUS | Status: AC
  Administered 2012-02-04 (×3): 10 meq via INTRAVENOUS

## 2012-02-04 MED ORDER — ACETAMINOPHEN 650 MG RE SUPP
650.0000 mg | RECTAL | Status: AC
  Administered 2012-02-04: 650 mg via RECTAL

## 2012-02-04 MED ORDER — SODIUM CHLORIDE 0.9 % IV SOLN
0.4000 ug/kg/h | INTRAVENOUS | Status: DC
  Administered 2012-02-04: 0.7 ug/kg/h via INTRAVENOUS
  Filled 2012-02-04: qty 4

## 2012-02-04 MED ORDER — SODIUM CHLORIDE 0.9 % IV SOLN: 250.0000 mL | INTRAVENOUS | Status: DC

## 2012-02-04 MED ORDER — LACTATED RINGERS IV SOLN
INTRAVENOUS | Status: DC | PRN
  Administered 2012-02-04 (×2): via INTRAVENOUS

## 2012-02-04 MED ORDER — NITROGLYCERIN IN D5W 200-5 MCG/ML-% IV SOLN
0.0000 ug/min | INTRAVENOUS | Status: DC
Start: 2012-02-04 — End: 2012-02-06
  Administered 2012-02-04: 10 ug/min via INTRAVENOUS

## 2012-02-04 MED ORDER — PHENYLEPHRINE HCL 10 MG/ML IJ SOLN
10.0000 mg | INTRAVENOUS | Status: DC | PRN
  Administered 2012-02-04: 25 ug/min via INTRAVENOUS

## 2012-02-04 MED ORDER — MIDAZOLAM HCL 5 MG/5ML IJ SOLN
INTRAMUSCULAR | Status: DC | PRN
  Administered 2012-02-04: 7 mg via INTRAVENOUS
  Administered 2012-02-04: 1 mg via INTRAVENOUS
  Administered 2012-02-04: 5 mg via INTRAVENOUS
  Administered 2012-02-04: 1 mg via INTRAVENOUS
  Administered 2012-02-04: 2 mg via INTRAVENOUS
  Administered 2012-02-04: 1 mg via INTRAVENOUS

## 2012-02-04 MED ORDER — PROTAMINE SULFATE 10 MG/ML IV SOLN
INTRAVENOUS | Status: DC | PRN
  Administered 2012-02-04: 290 mg via INTRAVENOUS
  Administered 2012-02-04: 10 mg via INTRAVENOUS

## 2012-02-04 MED ORDER — LACTATED RINGERS IV SOLN: 500.0000 mL | Freq: Once | INTRAVENOUS | Status: AC | PRN

## 2012-02-04 MED ORDER — ACETAMINOPHEN 500 MG PO TABS
1000.0000 mg | ORAL_TABLET | Freq: Four times a day (QID) | ORAL | Status: AC
  Administered 2012-02-05 – 2012-02-09 (×17): 1000 mg via ORAL
  Filled 2012-02-04 (×19): qty 2

## 2012-02-04 MED ORDER — SODIUM CHLORIDE 0.9 % IV SOLN: INTRAVENOUS | Status: DC

## 2012-02-04 MED ORDER — HEMOSTATIC AGENTS (NO CHARGE) OPTIME
TOPICAL | Status: DC | PRN
  Administered 2012-02-04: 3 via TOPICAL

## 2012-02-04 MED ORDER — METOPROLOL TARTRATE 25 MG/10 ML ORAL SUSPENSION
12.5000 mg | Freq: Two times a day (BID) | ORAL | Status: DC
  Filled 2012-02-04 (×3): qty 5

## 2012-02-04 MED ORDER — POTASSIUM CHLORIDE 10 MEQ/50ML IV SOLN
10.0000 meq | Freq: Once | INTRAVENOUS | Status: AC
  Administered 2012-02-04: 10 meq via INTRAVENOUS

## 2012-02-04 MED ORDER — LACTATED RINGERS IV SOLN
INTRAVENOUS | Status: DC
  Administered 2012-02-04: 14:00:00 via INTRAVENOUS

## 2012-02-04 MED ORDER — INSULIN REGULAR BOLUS VIA INFUSION
0.0000 [IU] | Freq: Three times a day (TID) | INTRAVENOUS | Status: DC
  Filled 2012-02-04: qty 10

## 2012-02-04 MED ORDER — BISACODYL 10 MG RE SUPP: 10.0000 mg | Freq: Every day | RECTAL | Status: DC

## 2012-02-04 MED ORDER — ROCURONIUM BROMIDE 100 MG/10ML IV SOLN
INTRAVENOUS | Status: DC | PRN
  Administered 2012-02-04: 100 mg via INTRAVENOUS

## 2012-02-04 MED ORDER — ALBUMIN HUMAN 5 % IV SOLN
250.0000 mL | INTRAVENOUS | Status: AC | PRN
  Administered 2012-02-04 – 2012-02-05 (×2): 250 mL via INTRAVENOUS

## 2012-02-04 MED ORDER — VECURONIUM BROMIDE 10 MG IV SOLR
INTRAVENOUS | Status: DC | PRN
  Administered 2012-02-04: 3 mg via INTRAVENOUS
  Administered 2012-02-04 (×3): 5 mg via INTRAVENOUS

## 2012-02-04 MED ORDER — OXYCODONE HCL 5 MG PO TABS
5.0000 mg | ORAL_TABLET | ORAL | Status: DC | PRN
  Administered 2012-02-05 – 2012-02-10 (×14): 10 mg via ORAL
  Filled 2012-02-04 (×15): qty 2

## 2012-02-04 MED ORDER — DEXMEDETOMIDINE HCL 100 MCG/ML IV SOLN
0.1000 ug/kg/h | INTRAVENOUS | Status: DC
  Administered 2012-02-04: 0.2 ug/kg/h via INTRAVENOUS
  Filled 2012-02-04: qty 2

## 2012-02-04 MED ORDER — ACETAMINOPHEN 160 MG/5ML PO SOLN: 650.0000 mg | ORAL | Status: AC

## 2012-02-04 SURGICAL SUPPLY — 95 items
ADAPTER CARDIO PERF ANTE/RETRO (ADAPTER) IMPLANT
ADPR PRFSN 84XANTGRD RTRGD (ADAPTER)
APPLIER CLIP 9.375 SM OPEN (CLIP)
APR CLP SM 9.3 20 MLT OPN (CLIP)
ATTRACTOMAT 16X20 MAGNETIC DRP (DRAPES) ×2 IMPLANT
BAG DECANTER FOR FLEXI CONT (MISCELLANEOUS) ×2 IMPLANT
BANDAGE ELASTIC 4 VELCRO ST LF (GAUZE/BANDAGES/DRESSINGS) ×2 IMPLANT
BANDAGE ELASTIC 6 VELCRO ST LF (GAUZE/BANDAGES/DRESSINGS) ×2 IMPLANT
BANDAGE GAUZE ELAST BULKY 4 IN (GAUZE/BANDAGES/DRESSINGS) ×2 IMPLANT
BASKET HEART (ORDER IN 25'S) (MISCELLANEOUS) ×1
BASKET HEART (ORDER IN 25S) (MISCELLANEOUS) ×1 IMPLANT
BLADE SAW STERNAL (BLADE) ×2 IMPLANT
BLADE STERNUM SYSTEM 6 (BLADE) ×1 IMPLANT
BLADE SURG 11 STRL SS (BLADE) IMPLANT
CANISTER SUCTION 2500CC (MISCELLANEOUS) ×2 IMPLANT
CANN PRFSN .5XCNCT 15X34-48 (MISCELLANEOUS) ×1
CANNULA GUNDRY RCSP 15FR (MISCELLANEOUS) IMPLANT
CANNULA PRFSN .5XCNCT 15X34-48 (MISCELLANEOUS) ×1 IMPLANT
CANNULA VEN 2 STAGE (MISCELLANEOUS) ×2
CATH CPB KIT HENDRICKSON (MISCELLANEOUS) ×2 IMPLANT
CATH ROBINSON RED A/P 18FR (CATHETERS) ×4 IMPLANT
CATH THORACIC 36FR RT ANG (CATHETERS) IMPLANT
CLIP APPLIE 9.375 SM OPEN (CLIP) IMPLANT
CLIP FOGARTY SPRING 6M (CLIP) IMPLANT
CLIP RETRACTION 3.0MM CORONARY (MISCELLANEOUS) ×1 IMPLANT
CLIP TI MEDIUM 24 (CLIP) IMPLANT
CLIP TI WIDE RED SMALL 24 (CLIP) ×4 IMPLANT
CLOTH BEACON ORANGE TIMEOUT ST (SAFETY) ×2 IMPLANT
CONN Y 3/8X3/8X3/8  BEN (MISCELLANEOUS)
CONN Y 3/8X3/8X3/8 BEN (MISCELLANEOUS) IMPLANT
COVER SURGICAL LIGHT HANDLE (MISCELLANEOUS) ×4 IMPLANT
CRADLE DONUT ADULT HEAD (MISCELLANEOUS) ×2 IMPLANT
DRAPE CARDIOVASCULAR INCISE (DRAPES) ×2
DRAPE SLUSH/WARMER DISC (DRAPES) IMPLANT
DRAPE SRG 135X102X78XABS (DRAPES) ×1 IMPLANT
DRSG COVADERM 4X14 (GAUZE/BANDAGES/DRESSINGS) ×2 IMPLANT
ELECT PAD GROUND ADT 9 (MISCELLANEOUS) IMPLANT
ELECT REM PT RETURN 9FT ADLT (ELECTROSURGICAL) ×4
ELECTRODE REM PT RTRN 9FT ADLT (ELECTROSURGICAL) ×2 IMPLANT
GLOVE BIO SURGEON STRL SZ 6 (GLOVE) ×2 IMPLANT
GLOVE BIO SURGEON STRL SZ 6.5 (GLOVE) ×3 IMPLANT
GLOVE BIO SURGEON STRL SZ7.5 (GLOVE) ×2 IMPLANT
GLOVE EUDERMIC 7 POWDERFREE (GLOVE) ×7 IMPLANT
GOWN PREVENTION PLUS XLARGE (GOWN DISPOSABLE) ×6 IMPLANT
GOWN STRL NON-REIN LRG LVL3 (GOWN DISPOSABLE) ×9 IMPLANT
HEMOSTAT POWDER SURGIFOAM 1G (HEMOSTASIS) ×6 IMPLANT
HEMOSTAT SURGICEL 2X14 (HEMOSTASIS) ×2 IMPLANT
INSERT FOGARTY XLG (MISCELLANEOUS) IMPLANT
KIT BASIN OR (CUSTOM PROCEDURE TRAY) ×2 IMPLANT
KIT PAIN CUSTOM (MISCELLANEOUS) IMPLANT
KIT ROOM TURNOVER OR (KITS) ×2 IMPLANT
KIT SUCTION CATH 14FR (SUCTIONS) ×6 IMPLANT
KIT VASOVIEW 6 PRO VH 2400 (KITS) ×2 IMPLANT
MARKER GRAFT CORONARY BYPASS (MISCELLANEOUS) ×6 IMPLANT
NS IRRIG 1000ML POUR BTL (IV SOLUTION) ×10 IMPLANT
PACK OPEN HEART (CUSTOM PROCEDURE TRAY) ×2 IMPLANT
PAD ARMBOARD 7.5X6 YLW CONV (MISCELLANEOUS) ×4 IMPLANT
PENCIL BUTTON HOLSTER BLD 10FT (ELECTRODE) ×2 IMPLANT
PUNCH AORTIC ROTATE 4.0MM (MISCELLANEOUS) IMPLANT
PUNCH AORTIC ROTATE 4.5MM 8IN (MISCELLANEOUS) ×1 IMPLANT
PUNCH AORTIC ROTATE 5MM 8IN (MISCELLANEOUS) IMPLANT
SET CARDIOPLEGIA MPS 5001102 (MISCELLANEOUS) ×1 IMPLANT
SPONGE GAUZE 4X4 12PLY (GAUZE/BANDAGES/DRESSINGS) ×4 IMPLANT
SUT MNCRL AB 4-0 PS2 18 (SUTURE) IMPLANT
SUT PROLENE 3 0 SH DA (SUTURE) ×2 IMPLANT
SUT PROLENE 4 0 RB 1 (SUTURE)
SUT PROLENE 4 0 SH DA (SUTURE) IMPLANT
SUT PROLENE 4-0 RB1 .5 CRCL 36 (SUTURE) IMPLANT
SUT PROLENE 6 0 C 1 30 (SUTURE) ×5 IMPLANT
SUT PROLENE 7 0 BV1 MDA (SUTURE) ×5 IMPLANT
SUT PROLENE 8 0 BV175 6 (SUTURE) ×1 IMPLANT
SUT SILK  1 MH (SUTURE) ×1
SUT SILK 1 MH (SUTURE) ×1 IMPLANT
SUT STEEL 6MS V (SUTURE) ×1 IMPLANT
SUT STEEL STERNAL CCS#1 18IN (SUTURE) ×1 IMPLANT
SUT STEEL SZ 6 DBL 3X14 BALL (SUTURE) ×2 IMPLANT
SUT VIC AB 1 CTX 36 (SUTURE) ×4
SUT VIC AB 1 CTX36XBRD ANBCTR (SUTURE) ×2 IMPLANT
SUT VIC AB 2-0 CT1 27 (SUTURE) ×2
SUT VIC AB 2-0 CT1 TAPERPNT 27 (SUTURE) IMPLANT
SUT VIC AB 2-0 CTX 27 (SUTURE) IMPLANT
SUT VIC AB 3-0 SH 27 (SUTURE)
SUT VIC AB 3-0 SH 27X BRD (SUTURE) IMPLANT
SUT VIC AB 3-0 X1 27 (SUTURE) ×2 IMPLANT
SUT VICRYL 4-0 PS2 18IN ABS (SUTURE) IMPLANT
SUTURE E-PAK OPEN HEART (SUTURE) ×2 IMPLANT
SYSTEM SAHARA CHEST DRAIN ATS (WOUND CARE) ×2 IMPLANT
TAPE CLOTH SURG 4X10 WHT LF (GAUZE/BANDAGES/DRESSINGS) ×1 IMPLANT
TOWEL OR 17X24 6PK STRL BLUE (TOWEL DISPOSABLE) ×4 IMPLANT
TOWEL OR 17X26 10 PK STRL BLUE (TOWEL DISPOSABLE) ×6 IMPLANT
TRAY FOLEY IC TEMP SENS 14FR (CATHETERS) ×2 IMPLANT
TUBE FEEDING 8FR 16IN STR KANG (MISCELLANEOUS) ×1 IMPLANT
TUBING INSUFFLATION 10FT LAP (TUBING) ×2 IMPLANT
UNDERPAD 30X30 INCONTINENT (UNDERPADS AND DIAPERS) ×2 IMPLANT
WATER STERILE IRR 1000ML POUR (IV SOLUTION) ×4 IMPLANT

## 2012-02-04 NOTE — Procedures (Signed)
Extubation Procedure Note  Patient Details:   Name: [name] DOB: 1951/01/18 MRN: [id number]   Airway Documentation:  Airway 8 mm (Active)  Secured at (cm) 23 cm 02/04/2012  2:16 PM  Measured From Lips 02/04/2012  2:16 PM  Secured Location Right 02/04/2012  2:05 PM  Secured By Pink Tape 02/04/2012  2:16 PM    Evaluation  O2 sats: stable throughout Complications: No apparent complications Patient did tolerate procedure well. Bilateral Breath Sounds: Clear   Yes Positive cuff leak prior to extubation. NIF -25, VC 650, IS performed with little effort.  Ok Anis, MA 02/04/2012, 6:35 PM

## 2012-02-04 NOTE — Preoperative (Signed)
Beta Blockers   Reason not to administer Beta Blockers:Not Applicable 

## 2012-02-04 NOTE — Progress Notes (Signed)
SICU p.m. Rounds  Status post CABG x5 earlier today Patient stable, extubated, sinus rhythm Doing well postop

## 2012-02-04 NOTE — Anesthesia Procedure Notes (Addendum)
Procedure Name: Intubation Date/Time: 02/04/2012 7:59 AM Performed by: Lovie Chol Pre-anesthesia Checklist: Patient identified, Emergency Drugs available, Suction available and Patient being monitored Patient Re-evaluated:Patient Re-evaluated prior to inductionOxygen Delivery Method: Circle system utilized Preoxygenation: Pre-oxygenation with 100% oxygen Intubation Type: IV induction Ventilation: Mask ventilation without difficulty Grade View: Grade I Tube type: Oral Tube size: 8.0 mm Number of attempts: 1 Airway Equipment and Method: Video-laryngoscopy and Stylet Placement Confirmation: positive ETCO2,  breath sounds checked- equal and bilateral and CO2 detector Secured at: 22 cm Tube secured with: Tape Dental Injury: Teeth and Oropharynx as per pre-operative assessment  Future Recommendations: Recommend- induction with short-acting agent, and alternative techniques readily available

## 2012-02-04 NOTE — H&P (Signed)
Reason for Consult:3 vessel CAD, s/p NQWMI  Referring Physician: Dr. Dillard Cannon is an 61 y.o. female.  HPI: 61 yo WF with known CAD(previous stent to RCA). Presents with 5 day history of waxing and waning chest pain. She states that she had had a few episodes of similar pain with exertion in the weeks leading up to this event. She also became diaphoretic frequently with exertion. She also complains of feeling very fatigued for several months prior. Her current pain is substernal, "burning' and radiates through to her back.  Past Medical History   Diagnosis  Date   .  PERIPHERAL VASCULAR DISEASE  07/15/2009   .  MYOCARDIAL INFARCTION, HX OF  08/23/2007   .  HYPERTENSION  08/23/2007   .  HYPERLIPIDEMIA  08/23/2007   .  CORONARY ARTERY DISEASE  08/23/2007   .  Somatization disorder  08/23/2007   .  Shortness of breath  03/03/2009   .  RENAL INSUFFICIENCY  03/02/2009   .  PERIPHERAL EDEMA    .  OSTEOARTHRITIS    .  OBESITY    .  MENOPAUSAL DISORDER    .  LOW BACK PAIN    .  IBS    .  HYPOTHYROIDISM    .  HYPOKALEMIA    .  GLUCOSE INTOLERANCE    .  GERD    .  FIBROMYALGIA    .  FATIGUE, CHRONIC    .  DIVERTICULITIS, HX OF    .  COPD    .  COLONIC POLYPS, HX OF    .  CHEST PAIN    .  BREAST MASS, LEFT    .  ASTHMA    .  ANXIETY    .  ANEMIA-IRON DEFICIENCY    .  ALLERGIC RHINITIS    .  Angina    .  Stroke    .  Blood transfusion     She denies Peripheral arterial disease  Past Surgical History   Procedure  Date   .  Cholecystectomy    .  Oophorectomy    .  Replacement total knee    .  Coronary stent placement    .  Abdominal hysterectomy    .  Colon surgery    .  Cardiac catheterization    .  Appendectomy    .  Tonsillectomy    .  Coronary angioplasty     History reviewed. No pertinent family history.  Social History: reports that she has never smoked. She does not have any smokeless tobacco history on file. She reports that she does not drink alcohol or  use illicit drugs.  Allergies:  Allergies   Allergen  Reactions   .  Sulfonamide Derivatives     Medications:  I have reviewed the patient's current medications.  Prior to Admission:  Prescriptions prior to admission   Medication  Sig  Dispense  Refill   .  albuterol (PROVENTIL HFA;VENTOLIN HFA) 108 (90 BASE) MCG/ACT inhaler  Inhale 2 puffs into the lungs every 6 (six) hours as needed. Shortness of breath     .  aspirin 325 MG tablet  Take 325 mg by mouth daily.     .  carvedilol (COREG) 25 MG tablet  Take 1 tablet (25 mg total) by mouth 2 (two) times daily with a meal.  60 tablet  12   .  clopidogrel (PLAVIX) 75 MG tablet  Take 1 tablet (75 mg total) by mouth  daily.  30 tablet  12   .  isosorbide mononitrate (IMDUR) 60 MG 24 hr tablet  Take 1 tablet (60 mg total) by mouth daily.  30 tablet  12   .  levothyroxine (SYNTHROID, LEVOTHROID) 125 MCG tablet  TAKE 1 TABLET BY MOUTH ONCE A DAY  30 tablet  0   .  losartan (COZAAR) 100 MG tablet  Take 50 mg by mouth daily.     .  metaxalone (SKELAXIN) 800 MG tablet  Take 800 mg by mouth 2 (two) times daily as needed.     .  nitroGLYCERIN (NITROSTAT) 0.4 MG SL tablet  Place 0.4 mg under the tongue every 5 (five) minutes as needed. Chest pain     .  omeprazole (PRILOSEC) 20 MG capsule  Take 20 mg by mouth 2 (two) times daily.     .  pravastatin (PRAVACHOL) 40 MG tablet  Take 1 tablet (40 mg total) by mouth daily.  30 tablet  12   .  Fluticasone-Salmeterol (ADVAIR) 250-50 MCG/DOSE AEPB  Inhale 1 puff into the lungs every 12 (twelve) hours as needed. Shortness of breath      Results for orders placed during the hospital encounter of 01/30/12 (from the past 48 hour(s))   CBC Status: Abnormal    Collection Time    01/30/12 12:26 AM   Component  Value  Range  Comment    WBC  11.1 (*)  4.0 - 10.5 (K/uL)     RBC  4.54  3.87 - 5.11 (MIL/uL)     Hemoglobin  12.7  12.0 - 15.0 (g/dL)     HCT  47.8  29.5 - 46.0 (%)     MCV  87.4  78.0 - 100.0 (fL)     MCH   28.0  26.0 - 34.0 (pg)     MCHC  32.0  30.0 - 36.0 (g/dL)     RDW  62.1  30.8 - 15.5 (%)     Platelets  355  150 - 400 (K/uL)    DIFFERENTIAL Status: Abnormal    Collection Time    01/30/12 12:26 AM   Component  Value  Range  Comment    Neutrophils Relative  48  43 - 77 (%)     Neutro Abs  5.4  1.7 - 7.7 (K/uL)     Lymphocytes Relative  41  12 - 46 (%)     Lymphs Abs  4.5 (*)  0.7 - 4.0 (K/uL)     Monocytes Relative  8  3 - 12 (%)     Monocytes Absolute  0.9  0.1 - 1.0 (K/uL)     Eosinophils Relative  3  0 - 5 (%)     Eosinophils Absolute  0.3  0.0 - 0.7 (K/uL)     Basophils Relative  0  0 - 1 (%)     Basophils Absolute  0.0  0.0 - 0.1 (K/uL)    BASIC METABOLIC PANEL Status: Abnormal    Collection Time    01/30/12 12:26 AM   Component  Value  Range  Comment    Sodium  139  135 - 145 (mEq/L)     Potassium  3.8  3.5 - 5.1 (mEq/L)     Chloride  100  96 - 112 (mEq/L)     CO2  28  19 - 32 (mEq/L)     Glucose, Bld  124 (*)  70 - 99 (mg/dL)     BUN  12  6 - 23 (mg/dL)     Creatinine, Ser  7.82  0.50 - 1.10 (mg/dL)     Calcium  9.9  8.4 - 10.5 (mg/dL)     GFR calc non Af Amer  73 (*)  >90 (mL/min)     GFR calc Af Amer  85 (*)  >90 (mL/min)    APTT Status: Normal    Collection Time    01/30/12 1:16 AM   Component  Value  Range  Comment    aPTT  34  24 - 37 (seconds)    PROTIME-INR Status: Normal    Collection Time    01/30/12 1:16 AM   Component  Value  Range  Comment    Prothrombin Time  13.5  11.6 - 15.2 (seconds)     INR  1.01  0.00 - 1.49    POCT I-STAT TROPONIN I Status: Abnormal    Collection Time    01/30/12 1:34 AM   Component  Value  Range  Comment    Troponin i, poc  0.33 (*)  0.00 - 0.08 (ng/mL)     Comment  NOTIFIED PHYSICIAN      Comment 3      MRSA PCR SCREENING Status: Normal    Collection Time    01/30/12 6:04 AM   Component  Value  Range  Comment    MRSA by PCR  NEGATIVE  NEGATIVE    CARDIAC PANEL(CRET KIN+CKTOT+MB+TROPI) Status: Abnormal    Collection Time      01/30/12 6:13 AM   Component  Value  Range  Comment    Total CK  191 (*)  7 - 177 (U/L)     CK, MB  5.0 (*)  0.3 - 4.0 (ng/mL)     Troponin I  0.72 (*)  <0.30 (ng/mL)     Relative Index  2.6 (*)  0.0 - 2.5    PROTIME-INR Status: Normal    Collection Time    01/30/12 6:13 AM   Component  Value  Range  Comment    Prothrombin Time  14.0  11.6 - 15.2 (seconds)     INR  1.06  0.00 - 1.49    CBC Status: Normal    Collection Time    01/30/12 6:13 AM   Component  Value  Range  Comment    WBC  8.3  4.0 - 10.5 (K/uL)     RBC  4.34  3.87 - 5.11 (MIL/uL)     Hemoglobin  12.3  12.0 - 15.0 (g/dL)     HCT  95.6  21.3 - 46.0 (%)     MCV  88.0  78.0 - 100.0 (fL)     MCH  28.3  26.0 - 34.0 (pg)     MCHC  32.2  30.0 - 36.0 (g/dL)     RDW  08.6  57.8 - 15.5 (%)     Platelets  309  150 - 400 (K/uL)    DIFFERENTIAL Status: Normal    Collection Time    01/30/12 6:13 AM   Component  Value  Range  Comment    Neutrophils Relative  53  43 - 77 (%)     Neutro Abs  4.4  1.7 - 7.7 (K/uL)     Lymphocytes Relative  40  12 - 46 (%)     Lymphs Abs  3.3  0.7 - 4.0 (K/uL)     Monocytes Relative  5  3 - 12 (%)  Monocytes Absolute  0.4  0.1 - 1.0 (K/uL)     Eosinophils Relative  2  0 - 5 (%)     Eosinophils Absolute  0.2  0.0 - 0.7 (K/uL)     Basophils Relative  1  0 - 1 (%)     Basophils Absolute  0.0  0.0 - 0.1 (K/uL)    TSH Status: Abnormal    Collection Time    01/30/12 6:13 AM   Component  Value  Range  Comment    TSH  4.936 (*)  0.350 - 4.500 (uIU/mL)    COMPREHENSIVE METABOLIC PANEL Status: Abnormal    Collection Time    01/30/12 6:13 AM   Component  Value  Range  Comment    Sodium  139  135 - 145 (mEq/L)     Potassium  4.1  3.5 - 5.1 (mEq/L)     Chloride  99  96 - 112 (mEq/L)     CO2  29  19 - 32 (mEq/L)     Glucose, Bld  120 (*)  70 - 99 (mg/dL)     BUN  11  6 - 23 (mg/dL)     Creatinine, Ser  1.61  0.50 - 1.10 (mg/dL)     Calcium  09.6  8.4 - 10.5 (mg/dL)     Total Protein  6.9  6.0 - 8.3  (g/dL)     Albumin  3.5  3.5 - 5.2 (g/dL)     AST  20  0 - 37 (U/L)     ALT  16  0 - 35 (U/L)     Alkaline Phosphatase  70  39 - 117 (U/L)     Total Bilirubin  0.2 (*)  0.3 - 1.2 (mg/dL)     GFR calc non Af Amer  71 (*)  >90 (mL/min)     GFR calc Af Amer  82 (*)  >90 (mL/min)    MAGNESIUM Status: Normal    Collection Time    01/30/12 6:13 AM   Component  Value  Range  Comment    Magnesium  1.7  1.5 - 2.5 (mg/dL)    PLATELET INHIBITION P2Y12 Status: Normal    Collection Time    01/30/12 6:13 AM   Component  Value  Range  Comment    Platelet Function P2Y12  287  194 - 418 (PRU)    CARDIAC PANEL(CRET KIN+CKTOT+MB+TROPI) Status: Abnormal    Collection Time    01/30/12 11:10 AM   Component  Value  Range  Comment    Total CK  174  7 - 177 (U/L)     CK, MB  5.8 (*)  0.3 - 4.0 (ng/mL)     Troponin I  0.66 (*)  <0.30 (ng/mL)     Relative Index  3.3 (*)  0.0 - 2.5     Dg Chest 2 View  01/30/2012 *RADIOLOGY REPORT* Clinical Data: Substernal chest pain; shortness of breath, nausea and diaphoresis. CHEST - 2 VIEW Comparison: Chest radiograph performed 08/17/2011 Findings: The lungs are well-aerated. Mild chronic peribronchial thickening is noted. There is no evidence of focal opacification, pleural effusion or pneumothorax. The heart is normal in size; the mediastinal contour is within normal limits. No acute osseous abnormalities are seen. IMPRESSION: No acute cardiopulmonary process seen. Original Report Authenticated By: Tonia Ghent, M.D.   Review of Systems  Constitutional: Positive for malaise/fatigue and diaphoresis. Negative for fever, chills and weight loss.  HENT: Positive for neck pain.  Respiratory: Positive for shortness of breath. Negative for cough.  Cardiovascular: Positive for chest pain and leg swelling. Negative for palpitations and claudication.  Gastrointestinal: Positive for heartburn.  Genitourinary: Negative.  Musculoskeletal: Positive for myalgias, back pain and joint  pain.  Skin: Negative.  Neurological: Positive for weakness and headaches.  Endo/Heme/Allergies: Does not bruise/bleed easily.  All other systems reviewed and are negative.   Blood pressure 100/66, pulse 74, temperature 98 F (36.7 C), temperature source Oral, resp. rate 17, height 5' (1.524 m), weight 206 lb 5.6 oz (93.6 kg), SpO2 91.00%.  Physical Exam  Vitals reviewed.  Constitutional: She is oriented to person, place, and time. No distress.  Obese HENT:  Head: Normocephalic and atraumatic.  Eyes: EOM are normal. Pupils are equal, round, and reactive to light.  Neck: Neck supple. No thyromegaly present.  Cardiovascular: Normal rate, regular rhythm, normal heart sounds and intact distal pulses. Exam reveals no gallop and no friction rub.  No murmur heard.  Respiratory: Effort normal. She has no wheezes. She has no rales.  GI: Soft. There is no tenderness.  Musculoskeletal: She exhibits no edema.  Lymphadenopathy:  She has no cervical adenopathy.  Neurological: She is alert and oriented to person, place, and time. No cranial nerve deficit.  Skin: Skin is warm and dry.   Assessment/Plan:  61 yo WF with a strong family history of CAD and multiple other risk factors, who presents with 5 days of CP and has positive enzymes. She has been on plavix due to a previous stent. She now has severe 3 vessel CAD. CABG is indicated for survival benefit and relief of symptoms.  I have discussed with the patient and her husband the general nature of the procedure, need for general anesthesia,and incisions to be used. I have discussed the expected hospital stay, overall recovery and short and long term outcomes. They understand CABG is palliative and there is risk of future cardiac events. They understand the risks of surgery include but are not limited to death, stroke, MI, DVT/PE, bleeding, possible need for transfusion, infections,other organ system dysfunction including respiratory, renal, or GI  complications. She understands and accepts these risks and agrees to proceed.  We will tentatively plan for surgery on Monday 4/1 to allow for plavix "washout" if her clinical status allows it. If she develops unstable CP we may be forced to proceed sooner.  Lossie Kalp C  01/30/2012, 4:58 PM   Interval- echo showed mild AS- valve area 1.61 cm2, 11 mm hg gradient

## 2012-02-04 NOTE — Anesthesia Postprocedure Evaluation (Signed)
  Anesthesia Post-op Note  Patient: [name]  Procedure(s) Performed: Procedure(s) (LRB): CORONARY ARTERY BYPASS GRAFTING (CABG) (N/A)  Patient Location: ICU  Anesthesia Type: General  Level of Consciousness: sedated and Patient remains intubated per anesthesia plan  Airway and Oxygen Therapy: Patient remains intubated per anesthesia plan and Patient placed on Ventilator (see vital sign flow sheet for setting)  Post-op Pain: none  Post-op Assessment: Post-op Vital signs reviewed, Patient's Cardiovascular Status Stable, Respiratory Function Stable, Patent Airway and No signs of Nausea or vomiting  Post-op Vital Signs: stable  Complications: No apparent anesthesia complications

## 2012-02-04 NOTE — OR Nursing (Signed)
Chest incision made at [phone]

## 2012-02-04 NOTE — Interval H&P Note (Signed)
History and Physical Interval Note:  02/04/2012 7:33 AM  [name]  has presented today for surgery, with the diagnosis of CAD  The various methods of treatment have been discussed with the patient and family. After consideration of risks, benefits and other options for treatment, the patient has consented to  Procedure(s) (LRB): CORONARY ARTERY BYPASS GRAFTING (CABG) (N/A) as a surgical intervention .  The patients' history has been reviewed, patient examined, no change in status, stable for surgery.  I have reviewed the patients' chart and labs.  Questions were answered to the patient's satisfaction.     Citlali Gautney C

## 2012-02-04 NOTE — OR Nursing (Signed)
12:50pm - 1st call to SICU, off pump made to volunteer desk to inform family.  13:20pm - 2nd call made to SICU.

## 2012-02-04 NOTE — Transfer of Care (Signed)
Immediate Anesthesia Transfer of Care Note  Patient: [name]  Procedure(s) Performed: Procedure(s) (LRB): CORONARY ARTERY BYPASS GRAFTING (CABG) (N/A)  Patient Location: SICU  Anesthesia Type: General  Level of Consciousness: unresponsive  Airway & Oxygen Therapy: Patient remains intubated per anesthesia plan  Post-op Assessment: Post -op Vital signs reviewed and stable and report to SICU RN  Post vital signs: Reviewed and stable  Complications: No apparent anesthesia complications

## 2012-02-04 NOTE — Anesthesia Preprocedure Evaluation (Addendum)
Anesthesia Evaluation  Patient identified by MRN, date of birth, ID band Patient awake    Reviewed: Allergy & Precautions, H&P , NPO status , Patient's Chart, lab work & pertinent test results, reviewed documented beta blocker date and time   History of Anesthesia Complications Negative for: history of anesthetic complications  Airway Mallampati: III TM Distance: >3 FB Neck ROM: Full    Dental  (+) Teeth Intact and Dental Advisory Given   Pulmonary shortness of breath, with exertion and at rest, asthma , COPD COPD inhaler,  + rhonchi         Cardiovascular hypertension, Pt. on medications and Pt. on home beta blockers + angina with exertion and at rest + CAD and + Past MI Rhythm:Regular Rate:Normal     Neuro/Psych Anxiety Stroke in patient's 40's. Patient reports slight right-sided weakness.  Neuromuscular disease CVA, Residual Symptoms    GI/Hepatic GERD-  Controlled,  Endo/Other  Hypothyroidism Morbid obesity  Renal/GU      Musculoskeletal  (+) Fibromyalgia -  Abdominal (+) + obese,   Peds  Hematology   Anesthesia Other Findings   Reproductive/Obstetrics                        Anesthesia Physical Anesthesia Plan  ASA: IV  Anesthesia Plan: General   Post-op Pain Management:    Induction: Intravenous  Airway Management Planned: Oral ETT and Video Laryngoscope Planned  Additional Equipment: Arterial line and PA Cath  Intra-op Plan:   Post-operative Plan: Post-operative intubation/ventilation  Informed Consent: I have reviewed the patients History and Physical, chart, labs and discussed the procedure including the risks, benefits and alternatives for the proposed anesthesia with the patient or authorized representative who has indicated his/her understanding and acceptance.     Plan Discussed with: CRNA and Surgeon  Anesthesia Plan Comments:        Anesthesia Quick  Evaluation

## 2012-02-04 NOTE — Brief Op Note (Addendum)
                   [address]            [place] [address]          [phone]    01/30/2012 - 02/04/2012  12:14 PM  PATIENT:  [name]  61 y.o. female  PRE-OPERATIVE DIAGNOSIS:  CAD  POST-OPERATIVE DIAGNOSIS:  CAD  PROCEDURE:  Procedure(s): CORONARY ARTERY BYPASS GRAFTING (CABG)x5 (LIMA-LAD; SEQSVG-RAMUS-OM1; SEQSVG-PD-PL)  EVH RIGHT LEG  SURGEON:  Surgeon(s): Loreli Slot, MD  PHYSICIAN ASSISTANT: WAYNE GOLD PA-C  ANESTHESIA:   general  PATIENT CONDITION:  ICU - intubated and hemodynamically stable.  PRE-OPERATIVE WEIGHT: 94kg  COMPLICATIONS:  NO KNOWN    XC 88 min CPB 126 min  LIMA good quality, Saphenous vein fair PDA, PL fair  LAD, Ramus and OM1 good

## 2012-02-04 NOTE — Op Note (Signed)
[name], [name] NO.:  [id number]  MEDICAL RECORD NO.:  [id number]  LOCATION:  2302                         FACILITY:  [facility]  PHYSICIAN:  Salvatore Decent. Dorris Fetch, M.D.DATE OF BIRTH:  1951-02-25  DATE OF PROCEDURE:  02/04/2012 DATE OF DISCHARGE:                              OPERATIVE REPORT   PREOPERATIVE DIAGNOSES:  Three-vessel coronary artery disease with unstable coronary artery syndrome.  POSTOPERATIVE DIAGNOSES:  Three-vessel coronary artery disease with unstable coronary artery syndrome.  PROCEDURE:  Median sternotomy, extracorporeal circulation, coronary artery bypass grafting x5 (left internal mammary artery to LAD, sequential saphenous vein graft to ramus intermedius and obtuse marginal 1, sequential saphenous vein graft to posterior descending and posterior lateral), endoscopic vein harvest right leg.  SURGEON:  Salvatore Decent. Dorris Fetch, M.D.  ASSISTANT:  Rowe Clack, PA-C.  ANESTHESIA:  General.  FINDINGS:  Saphenous vein fair quality.  Left mammary good quality, sternal osteoporosis.  LAD, ramus intermedius, and OM 1, good quality targets.  PD and PL fair quality targets.  CLINICAL INDICATION:  [name] is a 61 year old woman with known coronary artery disease with a previous stent to her right coronary. She now presents with an unstable coronary syndrome.  At cardiac catheterization, she was found to have severe three-vessel coronary disease.  She was referred for coronary artery bypass grafting.  The indications, risks, and benefits of surgery were discussed with the patient in detail.  She understood and accepted the risks and agreed to proceed.  OPERATIVE NOTE:  [name] was brought to the preop holding area on February 04, 2012.  There, the Anesthesia Service placed a Swan-Ganz catheter and an arterial blood pressure monitoring line.  Intravenous antibiotics were administered.  She was taken to the operating room, anesthetized  and intubated, and a Foley catheter was placed.  The chest, abdomen, and legs were prepped and draped usual sterile fashion.  An incision was made in the medial aspect of the right leg at the level of the knee.  Greater saphenous vein was harvested from the right leg endoscopically.  It was a fair quality.  Simultaneously, a median sternotomy was performed.  It was noted that the patient had significant sternal osteoporosis.  The left internal mammary artery was harvested using standard technique. It was a good quality vessel.  2000 units of heparin was administered during the vessel harvest and remainder of the full heparin dose was given prior to opening the pericardium.  After harvesting the conduit, the pericardium was opened.  The ascending aorta was inspected.  There was no evidence of atherosclerotic disease. The aorta was cannulated via concentric 2-0 Ethibond pledgeted pursestring sutures.  A dual-stage venous cannula was placed via pursestring suture in the atrial appendage.  Cardiopulmonary bypass was instituted and the patient was cooled to 32 degrees Celsius.  The coronary arteries were inspected and anastomotic sites were chosen.  The conduits were inspected and cut to length.  A foam pad was placed in the pericardium to insulate the heart and protect the left phrenic nerve.  A temperature probe was placed in myocardial septum and a cardioplegic cannula was placed in the ascending aorta.  The aorta was crossclamped.  The left ventricle was emptied via aortic root vent.  Cardiac arrest then was achieved with combination of cold antegrade blood cardioplegia and topical iced saline.  1 L of cardioplegia was administered.  There was a rapid diastolic arrest to myocardial septal cooling to 10 degrees Celsius.  The following distal anastomoses were performed.  First, a reverse saphenous vein graft was placed sequentially to the posterior descending and posterolateral branches  of the right coronary. Posterior descending branch was very difficult to find as the patient had a great deal of fat and the vessel was relatively small.  It arose from the right coronary just distal to the previous stent.  The right coronary itself was fairly calcified and not a graftable vessel in that vicinity.  The vein was anastomosed side-to-side to the posterior descending with a running 7-0 Prolene suture.  Probes were passed proximally and distally at completion of each anastomosis to ensure patency before tying the sutures.  The distal end of the vein then was beveled and then anastomosed end-to-side to the posterior lateral branch, which was chronically totally occluded.  This vessel was also fair quality and better than the posterior descending.  The distal anastomosis was performed with a running 7-0 Prolene suture. Cardioplegia then was administered down the vein.  There was good flow through the graft and good hemostasis at both anastomoses.  Next, a reverse saphenous vein graft was placed sequentially to the ramus intermedius and obtuse marginal 1 branch of the left circumflex. The ramus and OM 1 both were 1.5 mm good quality target vessels.  Side- to-side anastomosis was performed to the ramus and end-to-side to OM 1 vessel with running 7 Prolene sutures.  Again probe was passed easily proximally and distally at the completion of the anastomosis and cardioplegia was administered with good flow and good hemostasis.  Additional cardioplegia was administered down the aortic root.  The left internal mammary artery then was brought through a window in the pericardium.  The distal end was beveled and was then anastomosed end-to- side to the distal LAD.  The LAD was a 2 mm good quality target.  The mammary was a 2 mm good quality conduit.  The anastomosis was performed with a running 8-0 Prolene suture in an end-to-side fashion.  At completion of the anastomosis, the bulldog  clamp was briefly removed to inspect for hemostasis.  Immediate and rapid septal rewarming was noted. The bulldog clamp was replaced and the mammary pedicle was tacked to the epicardial surface of the heart with 6-0 Prolene sutures.  Additional cardioplegia was administered.  The cardioplegic cannula then was removed from the ascending aorta.  The vein grafts were cut to length.  The proximal vein graft anastomoses were performed to 4.5 mm punch aortotomies with running 6-0 Prolene sutures.  At the completion of final proximal anastomosis, the patient was placed in Trendelenburg position.  Lidocaine was administered.  The aortic root was de-aired. The bulldog clamp was again removed from the left mammary artery.  The aortic crossclamp was removed.  Total crossclamp time was 88 minutes. The patient required single defibrillation with 10 joules and then was initially in heart block thereafter.  While rewarming was completed, all proximal and distal anastomoses were inspected for hemostasis. Epicardial pacing wires were placed on the right ventricle and right atrium and DDD pacing was initiated.  When the patient rewarmed to a core temperature of 37 degrees Celsius, she was weaned from cardiopulmonary bypass on the first attempt without difficulty.  She was started on 3 mcg/kg of dopamine just prior to separation from bypass and was DDD paced at the time of separation from bypass.  The initial cardiac index was greater than 2 L/minute/meter squared and she remained hemodynamically stable throughout the postbypass period.  A test dose of protamine was administered and was well tolerated.  The atrial and aortic cannulae were removed.  The remainder of the protamine was administered without incident.  The chest was irrigated with warm saline.  Hemostasis was achieved.  A left pleural and single mediastinal chest tubes were placed through separate subcostal incisions.  The pericardium was  reapproximated over the aorta and base of the heart with interrupted 3-0 silk sutures.  It came together easily without any tension or kinking underlying grafts.  The sternum was closed with a combination of single and double heavy-gauge stainless steel wires.  The pectoralis fascia, subcutaneous tissue, and skin were closed in standard fashion.  All sponge, needle, instrument counts were correct at the end of the procedure.  The patient was taken from the operating room to the Surgical Intensive Care Unit in good condition.     Salvatore Decent Dorris Fetch, M.D.     SCH/MEDQ  D:  02/04/2012  T:  02/04/2012  Job:  [id number]

## 2012-02-04 NOTE — Plan of Care (Signed)
Problem: Phase II Progression Outcomes Goal: Dangles within 2 hrs after extubation Outcome: Completed/Met Date Met:  02/04/12 Dangled at 1845

## 2012-02-04 NOTE — Significant Event (Signed)
Cardiac index 1.6 compared with 2.9 at admission to SICU. MD Dorris Fetch notified. Per MD, proceed with extubation and to give fluids. BP in the 90-105/50-60. Pt received albumin X 1. Pt passed parameters set by respiratory. ABG obtained. Will extubate and monitor closely. Kalie Cabral, Charity fundraiser.

## 2012-02-05 ENCOUNTER — Inpatient Hospital Stay (HOSPITAL_COMMUNITY): Payer: Self-pay

## 2012-02-05 LAB — POCT I-STAT, CHEM 8
Calcium, Ion: 1.09 mmol/L — ABNORMAL LOW (ref 1.12–1.32)
HCT: 30 % — ABNORMAL LOW (ref 36.0–46.0)
Hemoglobin: 10.2 g/dL — ABNORMAL LOW (ref 12.0–15.0)
Sodium: 134 mEq/L — ABNORMAL LOW (ref 135–145)
TCO2: 25 mmol/L (ref 0–100)

## 2012-02-05 LAB — CBC
MCV: 89.1 fL (ref 78.0–100.0)
Platelets: 200 10*3/uL (ref 150–400)
Platelets: 203 10*3/uL (ref 150–400)
RBC: 3.11 MIL/uL — ABNORMAL LOW (ref 3.87–5.11)
RDW: 15.2 % (ref 11.5–15.5)
RDW: 15.5 % (ref 11.5–15.5)
WBC: 12.5 10*3/uL — ABNORMAL HIGH (ref 4.0–10.5)
WBC: 15.8 10*3/uL — ABNORMAL HIGH (ref 4.0–10.5)

## 2012-02-05 LAB — GLUCOSE, CAPILLARY
Glucose-Capillary: 99 mg/dL (ref 70–99)
Glucose-Capillary: 107 mg/dL — ABNORMAL HIGH (ref 70–99)
Glucose-Capillary: 115 mg/dL — ABNORMAL HIGH (ref 70–99)
Glucose-Capillary: 124 mg/dL — ABNORMAL HIGH (ref 70–99)
Glucose-Capillary: 112 mg/dL — ABNORMAL HIGH (ref 70–99)
Glucose-Capillary: 110 mg/dL — ABNORMAL HIGH (ref 70–99)
Glucose-Capillary: 111 mg/dL — ABNORMAL HIGH (ref 70–99)
Glucose-Capillary: 100 mg/dL — ABNORMAL HIGH (ref 70–99)
Glucose-Capillary: 100 mg/dL — ABNORMAL HIGH (ref 70–99)
Glucose-Capillary: 101 mg/dL — ABNORMAL HIGH (ref 70–99)
Glucose-Capillary: 113 mg/dL — ABNORMAL HIGH (ref 70–99)
Glucose-Capillary: 122 mg/dL — ABNORMAL HIGH (ref 70–99)

## 2012-02-05 LAB — BASIC METABOLIC PANEL
Chloride: 105 mEq/L (ref 96–112)
Creatinine, Ser: 0.8 mg/dL (ref 0.50–1.10)
GFR calc Af Amer: >90 mL/min (ref >90)

## 2012-02-05 LAB — CREATININE, SERUM
Creatinine, Ser: 0.86 mg/dL (ref 0.50–1.10)
GFR calc Af Amer: 83 mL/min — ABNORMAL LOW (ref >90)

## 2012-02-05 LAB — MAGNESIUM: Magnesium: 2.7 mg/dL — ABNORMAL HIGH (ref 1.5–2.5)

## 2012-02-05 MED ORDER — FLUTICASONE-SALMETEROL 250-50 MCG/DOSE IN AEPB
1.0000 | INHALATION_SPRAY | Freq: Two times a day (BID) | RESPIRATORY_TRACT | Status: DC
  Administered 2012-02-05 – 2012-02-10 (×11): 1 via RESPIRATORY_TRACT
  Filled 2012-02-05: qty 14

## 2012-02-05 MED ORDER — LEVOTHYROXINE SODIUM 125 MCG PO TABS
125.0000 ug | ORAL_TABLET | Freq: Every day | ORAL | Status: DC
  Administered 2012-02-05 – 2012-02-10 (×6): 125 ug via ORAL
  Filled 2012-02-05 (×9): qty 1

## 2012-02-05 MED ORDER — CLOPIDOGREL BISULFATE 75 MG PO TABS
75.0000 mg | ORAL_TABLET | Freq: Every day | ORAL | Status: DC
  Administered 2012-02-05 – 2012-02-10 (×6): 75 mg via ORAL
  Filled 2012-02-05 (×7): qty 1

## 2012-02-05 MED ORDER — ENOXAPARIN SODIUM 40 MG/0.4ML ~~LOC~~ SOLN
40.0000 mg | Freq: Every day | SUBCUTANEOUS | Status: DC
  Administered 2012-02-05 – 2012-02-09 (×5): 40 mg via SUBCUTANEOUS
  Filled 2012-02-05 (×6): qty 0.4

## 2012-02-05 MED ORDER — IPRATROPIUM-ALBUTEROL 18-103 MCG/ACT IN AERO
2.0000 | INHALATION_SPRAY | Freq: Four times a day (QID) | RESPIRATORY_TRACT | Status: DC | PRN
  Filled 2012-02-05: qty 14.7

## 2012-02-05 MED ORDER — INSULIN ASPART 100 UNIT/ML ~~LOC~~ SOLN
6.0000 [IU] | Freq: Three times a day (TID) | SUBCUTANEOUS | Status: DC
  Administered 2012-02-05: 6 [IU] via SUBCUTANEOUS
  Administered 2012-02-08: 12:00:00 via SUBCUTANEOUS

## 2012-02-05 MED ORDER — ALBUMIN HUMAN 5 % IV SOLN
12.5000 g | Freq: Once | INTRAVENOUS | Status: AC
  Administered 2012-02-05: 12.5 g via INTRAVENOUS
  Filled 2012-02-05: qty 250

## 2012-02-05 MED ORDER — INSULIN ASPART 100 UNIT/ML ~~LOC~~ SOLN
0.0000 [IU] | SUBCUTANEOUS | Status: DC
  Administered 2012-02-05 (×4): 2 [IU] via SUBCUTANEOUS

## 2012-02-05 MED ORDER — CARVEDILOL 3.125 MG PO TABS
3.1250 mg | ORAL_TABLET | Freq: Two times a day (BID) | ORAL | Status: DC
  Administered 2012-02-05 – 2012-02-10 (×11): 3.125 mg via ORAL
  Filled 2012-02-05 (×13): qty 1

## 2012-02-05 MED ORDER — METAXALONE 800 MG PO TABS
800.0000 mg | ORAL_TABLET | Freq: Three times a day (TID) | ORAL | Status: DC | PRN
  Administered 2012-02-05 (×2): 800 mg via ORAL
  Filled 2012-02-05: qty 1

## 2012-02-05 MED ORDER — FUROSEMIDE 10 MG/ML IJ SOLN
40.0000 mg | Freq: Once | INTRAMUSCULAR | Status: AC
  Administered 2012-02-05: 40 mg via INTRAVENOUS
  Filled 2012-02-05: qty 4

## 2012-02-05 MED ORDER — SIMVASTATIN 20 MG PO TABS
20.0000 mg | ORAL_TABLET | Freq: Every day | ORAL | Status: DC
  Administered 2012-02-05 – 2012-02-09 (×5): 20 mg via ORAL
  Filled 2012-02-05 (×6): qty 1

## 2012-02-05 MED ORDER — INSULIN GLARGINE 100 UNIT/ML ~~LOC~~ SOLN
40.0000 [IU] | SUBCUTANEOUS | Status: DC
  Administered 2012-02-05 – 2012-02-09 (×5): 40 [IU] via SUBCUTANEOUS

## 2012-02-05 MED FILL — Heparin Sodium (Porcine) Inj 1000 Unit/ML: INTRAMUSCULAR | Qty: 10 | Status: AC

## 2012-02-05 MED FILL — Magnesium Sulfate Inj 50%: INTRAMUSCULAR | Qty: 10 | Status: AC

## 2012-02-05 MED FILL — Nitroglycerin IV Soln 5 MG/ML: INTRAVENOUS | Qty: 10 | Status: AC

## 2012-02-05 MED FILL — Potassium Chloride Inj 2 mEq/ML: INTRAVENOUS | Qty: 40 | Status: AC

## 2012-02-05 MED FILL — Lactated Ringer's Solution: INTRAVENOUS | Qty: 500 | Status: AC

## 2012-02-05 MED FILL — Verapamil HCl IV Soln 2.5 MG/ML: INTRAVENOUS | Qty: 4 | Status: AC

## 2012-02-05 NOTE — Progress Notes (Signed)
Inpatient Diabetes Program Recommendations  AACE/ADA: New Consensus Statement on Inpatient Glycemic Control  Target Ranges:  Prepandial:   less than 140 mg/dL      Peak postprandial:   less than 180 mg/dL (1-2 hours)      Critically ill patients:  140 - 180 mg/dL  Pager:  [phone] Hours:  8 am-10pm   Reason for Visit: Elevated glucose and history of glucose intolerance  Inpatient Diabetes Program Recommendations HgbA1C: Check HgbA1C to assess glycemic control

## 2012-02-05 NOTE — Progress Notes (Signed)
   CARE MANAGEMENT NOTE 02/05/2012  Patient:  [name], [name]   Account Number:  [id number]  Date Initiated:  01/30/2012  Documentation initiated by:  GRAVES-BIGELOW,BRENDA  Subjective/Objective Assessment:   Pt admitted with cp-Nstemi. Cath revealed severe triple vessel disease. Possible CABG. Pt without insurance and she has concerns with bill.  Has spouse     Action/Plan:   CM did call [facility] to assist with payment of bill. Pt has PCP Oliver Barre. She uses [facility] for medications. CM did discuss with pt that if she has CABG may need HH services at d/c. She has used [facility] in past.   Anticipated DC Date:  02/08/2012   Anticipated DC Plan:  HOME W HOME HEALTH SERVICES  In-house referral  Financial Counselor      DC Planning Services  CM consult      Choice offered to / List presented to:             Status of service:  In process, will continue to follow Medicare Important Message given?   (If response is "NO", the following Medicare IM given date fields will be blank) Date Medicare IM given:   Date Additional Medicare IM given:    Discharge Disposition:    Per UR Regulation:  Reviewed for med. necessity/level of care/duration of stay  If discussed at Long Length of Stay Meetings, dates discussed:    Comments:  02/05/12 Dhairya Corales,RN,BSN 1500 CM REFERRAL TO ASSESS FINANCIAL NEEDS AS PT IS SELF PAY. ATTEMPTED TO MEET WITH PT,BUT SHE WAS SLEEPING.  WILL REFER TO FINANCIAL COUNSELOR TO ASSIST WITH HOSPITAL BILL, DISABILITY OPTIONS. Phone #[phone]   01-30-12 [address], [place] [phone] CM will continue to monitor for d/c disposition needs. MD at d/c please try to keep cost down by writin all generic meds if possible. Thanks

## 2012-02-05 NOTE — Progress Notes (Signed)
UR Completed.  Aziel Morgan Jane [phone] 02/05/2012  

## 2012-02-05 NOTE — Progress Notes (Signed)
6 Days Post-Op   Subjective: C/o incisional discomfort  Objective: Vital signs in last 24 hours: Temp:  [96.1 F (35.6 C)-100.2 F (37.9 C)] 99.5 F (37.5 C) (04/02 0737) Pulse Rate:  [79-91] 91  (04/02 0700) Cardiac Rhythm:  [-] A-V Sequential paced (04/02 0700) Resp:  [10-26] 19  (04/02 0700) BP: (71-109)/(36-81) 103/65 mmHg (04/02 0700) SpO2:  [93 %-100 %] 93 % (04/02 0700) Arterial Line BP: (91-130)/(55-76) 96/58 mmHg (04/02 0700) FiO2 (%):  [40 %-50 %] 40 % (04/01 1730) Weight:  [216 lb 7.9 oz (98.2 kg)] 216 lb 7.9 oz (98.2 kg) (04/02 0600)  Hemodynamic parameters for last 24 hours: PAP: (31-51)/(15-28) 33/22 mmHg CO:  [3.1 L/min-5.4 L/min] 4.3 L/min CI:  [1.6 L/min/m2-2.9 L/min/m2] 2.3 L/min/m2  Intake/Output from previous day: 04/01 0701 - 04/02 0700 In: 7137.9 [P.O.:300; I.V.:4607.9; Blood:450; NG/GT:30; IV Piggyback:1750] Out: 4740 [Urine:3670; Blood:900; Chest Tube:170] Intake/Output this shift:    General appearance: alert and no distress Neurologic: intact Heart: regular rate and rhythm Lungs: diminished breath sounds bibasilar Abdomen: normal findings: soft, non-tender  Lab Results:  Basename 02/05/12 0410 02/04/12 2038 02/04/12 2028  WBC 12.5* -- 12.5*  HGB 8.8* 8.8* --  HCT 27.7* 26.0* --  PLT 200 -- 197   BMET:  Basename 02/05/12 0410 02/04/12 2038  NA 138 137  K 5.0 5.3*  CL 105 107  CO2 24 --  GLUCOSE 105* 128*  BUN 7 5*  CREATININE 0.80 0.90  CALCIUM 8.1* --    PT/INR:  Basename 02/04/12 1400  LABPROT 15.7*  INR 1.22   ABG    Component Value Date/Time   PHART 7.351 02/04/2012 2038   HCO3 22.6 02/04/2012 2038   TCO2 24 02/04/2012 2038   TCO2 22 02/04/2012 2038   ACIDBASEDEF 3.0* 02/04/2012 2038   O2SAT 90.0 02/04/2012 2038   CBG (last 3)   Basename 02/05/12 0444 02/05/12 0319 02/05/12 0210  GLUCAP 100* 108* 111*    Assessment/Plan: S/P   -POD #1 CABG x 5 CV- good cardiac index, still on Neo, albumin/ Ca  RESP- bibasilar  atelectasis, IS/ OOB  RENAL- lytes, creatinine OK- diurese  ENDO- sugars OK on insulin gtt, transition to lantis/ SSI  ANEMIA- acute sec to ABL- follow  DVT prophylaxis- lovenox  D/C CT  OOB/ Ambulate   LOS: 6 days    Shannel Zahm C 02/05/2012

## 2012-02-05 NOTE — Progress Notes (Signed)
Patient ID: [name], female   DOB: [birth year], 61 y.o.   MRN: [id number] Some incisional discomfort BP 108/68  Pulse 76  Temp(Src) 98.3 F (36.8 C) (Oral)  Resp 18  Ht 5' (1.524 m)  Wt 216 lb 7.9 oz (98.2 kg)  BMI 42.28 kg/m2  SpO2 93% Looks good  Intake/Output Summary (Last 24 hours) at 02/05/12 1932 Last data filed at 02/05/12 1900  Gross per 24 hour  Intake 3326.02 ml  Output   2505 ml  Net 821.02 ml   Stable POD #1- continue current care

## 2012-02-06 ENCOUNTER — Encounter (HOSPITAL_COMMUNITY): Payer: Self-pay | Admitting: Thoracic Surgery (Cardiothoracic Vascular Surgery)

## 2012-02-06 ENCOUNTER — Inpatient Hospital Stay (HOSPITAL_COMMUNITY): Payer: Self-pay

## 2012-02-06 LAB — GLUCOSE, CAPILLARY
Glucose-Capillary: 116 mg/dL — ABNORMAL HIGH (ref 70–99)
Glucose-Capillary: 117 mg/dL — ABNORMAL HIGH (ref 70–99)
Glucose-Capillary: 117 mg/dL — ABNORMAL HIGH (ref 70–99)
Glucose-Capillary: 122 mg/dL — ABNORMAL HIGH (ref 70–99)
Glucose-Capillary: 127 mg/dL — ABNORMAL HIGH (ref 70–99)
Glucose-Capillary: 130 mg/dL — ABNORMAL HIGH (ref 70–99)

## 2012-02-06 LAB — CBC
MCH: 27.4 pg (ref 26.0–34.0)
MCHC: 30.9 g/dL (ref 30.0–36.0)
MCV: 88.6 fL (ref 78.0–100.0)
Platelets: 205 10*3/uL (ref 150–400)
RBC: 2.99 MIL/uL — ABNORMAL LOW (ref 3.87–5.11)
RDW: 15.4 % (ref 11.5–15.5)

## 2012-02-06 LAB — BASIC METABOLIC PANEL
Calcium: 8.2 mg/dL — ABNORMAL LOW (ref 8.4–10.5)
Creatinine, Ser: 0.76 mg/dL (ref 0.50–1.10)
GFR calc non Af Amer: 90 mL/min — ABNORMAL LOW (ref >90)
Glucose, Bld: 109 mg/dL — ABNORMAL HIGH (ref 70–99)
Sodium: 133 mEq/L — ABNORMAL LOW (ref 135–145)

## 2012-02-06 MED ORDER — ZOLPIDEM TARTRATE 5 MG PO TABS: 5.0000 mg | ORAL_TABLET | Freq: Every evening | ORAL | Status: DC | PRN

## 2012-02-06 MED ORDER — INSULIN ASPART 100 UNIT/ML ~~LOC~~ SOLN
0.0000 [IU] | Freq: Three times a day (TID) | SUBCUTANEOUS | Status: DC
  Administered 2012-02-06: 3 [IU] via SUBCUTANEOUS

## 2012-02-06 MED ORDER — ALUM & MAG HYDROXIDE-SIMETH 200-200-20 MG/5ML PO SUSP
15.0000 mL | ORAL | Status: DC | PRN
  Administered 2012-02-06 – 2012-02-08 (×3): 30 mL via ORAL
  Filled 2012-02-06 (×4): qty 30

## 2012-02-06 MED ORDER — FUROSEMIDE 10 MG/ML IJ SOLN
40.0000 mg | Freq: Once | INTRAMUSCULAR | Status: AC
  Administered 2012-02-06: 40 mg via INTRAVENOUS
  Filled 2012-02-06: qty 4

## 2012-02-06 MED ORDER — ALPRAZOLAM 0.25 MG PO TABS
0.2500 mg | ORAL_TABLET | Freq: Four times a day (QID) | ORAL | Status: DC | PRN
  Administered 2012-02-06 – 2012-02-09 (×2): 0.25 mg via ORAL
  Filled 2012-02-06 (×2): qty 1

## 2012-02-06 MED ORDER — SODIUM CHLORIDE 0.9 % IJ SOLN
3.0000 mL | Freq: Two times a day (BID) | INTRAMUSCULAR | Status: DC
  Administered 2012-02-06 – 2012-02-09 (×3): 3 mL via INTRAVENOUS

## 2012-02-06 MED ORDER — MOVING RIGHT ALONG BOOK
Freq: Once | Status: AC
  Administered 2012-02-06: 13:00:00
  Filled 2012-02-06: qty 1

## 2012-02-06 MED ORDER — TRAMADOL HCL 50 MG PO TABS
50.0000 mg | ORAL_TABLET | ORAL | Status: DC | PRN
  Administered 2012-02-06: 50 mg via ORAL
  Administered 2012-02-07: 100 mg via ORAL
  Administered 2012-02-07: 50 mg via ORAL
  Administered 2012-02-08 – 2012-02-09 (×2): 100 mg via ORAL
  Filled 2012-02-06 (×2): qty 1
  Filled 2012-02-06 (×3): qty 2

## 2012-02-06 MED ORDER — FUROSEMIDE 40 MG PO TABS
40.0000 mg | ORAL_TABLET | Freq: Every day | ORAL | Status: DC
  Administered 2012-02-07 – 2012-02-10 (×4): 40 mg via ORAL
  Filled 2012-02-06 (×4): qty 1

## 2012-02-06 MED ORDER — SODIUM CHLORIDE 0.9 % IV SOLN: 250.0000 mL | INTRAVENOUS | Status: DC | PRN

## 2012-02-06 MED ORDER — SODIUM CHLORIDE 0.9 % IJ SOLN: 3.0000 mL | INTRAMUSCULAR | Status: DC | PRN

## 2012-02-06 MED ORDER — MAGNESIUM HYDROXIDE 400 MG/5ML PO SUSP: 30.0000 mL | Freq: Every day | ORAL | Status: DC | PRN

## 2012-02-06 MED ORDER — INSULIN ASPART 100 UNIT/ML ~~LOC~~ SOLN: 0.0000 [IU] | Freq: Every day | SUBCUTANEOUS | Status: DC

## 2012-02-06 MED ORDER — LOSARTAN POTASSIUM 50 MG PO TABS
50.0000 mg | ORAL_TABLET | Freq: Every day | ORAL | Status: DC
  Administered 2012-02-07 – 2012-02-10 (×4): 50 mg via ORAL
  Filled 2012-02-06 (×4): qty 1

## 2012-02-06 MED ORDER — GUAIFENESIN-DM 100-10 MG/5ML PO SYRP: 15.0000 mL | ORAL_SOLUTION | ORAL | Status: DC | PRN

## 2012-02-06 MED ORDER — POTASSIUM CHLORIDE CRYS ER 20 MEQ PO TBCR
20.0000 meq | EXTENDED_RELEASE_TABLET | Freq: Two times a day (BID) | ORAL | Status: DC
  Administered 2012-02-06 – 2012-02-10 (×9): 20 meq via ORAL
  Filled 2012-02-06 (×10): qty 1

## 2012-02-06 NOTE — Progress Notes (Signed)
7 Days Post-Op   Subjective: I think I'm doing well Some nausea and mild discomfort  Objective: Vital signs in last 24 hours: Temp:  [97.9 F (36.6 C)-99.5 F (37.5 C)] 98.4 F (36.9 C) (04/03 0750) Pulse Rate:  [73-86] 85  (04/03 0700) Cardiac Rhythm:  [-] Normal sinus rhythm (04/03 0600) Resp:  [15-24] 21  (04/03 0700) BP: (90-124)/(47-90) 117/69 mmHg (04/03 0700) SpO2:  [84 %-96 %] 84 % (04/03 0700) Arterial Line BP: (89-131)/(49-72) 122/67 mmHg (04/02 1223) Weight:  [214 lb 8.1 oz (97.3 kg)] 214 lb 8.1 oz (97.3 kg) (04/03 0600)  Hemodynamic parameters for last 24 hours: PAP: (35-43)/(20-29) 37/22 mmHg CO:  [3.6 L/min] 3.6 L/min CI:  [1.9 L/min/m2] 1.9 L/min/m2  Intake/Output from previous day: 04/02 0701 - 04/03 0700 In: 2498.6 [P.O.:1200; I.V.:940.6; IV Piggyback:358] Out: 2315 [Urine:2295; Chest Tube:20] Intake/Output this shift:    General appearance: alert and no distress Neurologic: intact Heart: regular rate and rhythm Lungs: diminished breath sounds bibasilar Abdomen: normal findings: soft, non-tender  Lab Results:  Basename 02/06/12 0435 02/05/12 1728 02/05/12 1720  WBC 13.4* -- 15.8*  HGB 8.2* 10.2* --  HCT 26.5* 30.0* --  PLT 205 -- 203   BMET:  Basename 02/06/12 0435 02/05/12 1728 02/05/12 0410  NA 133* 134* --  K 4.3 4.4 --  CL 99 99 --  CO2 27 -- 24  GLUCOSE 109* 132* --  BUN 9 8 --  CREATININE 0.76 1.00 --  CALCIUM 8.2* -- 8.1*    PT/INR:  Basename 02/04/12 1400  LABPROT 15.7*  INR 1.22   ABG    Component Value Date/Time   PHART 7.351 02/04/2012 2038   HCO3 22.6 02/04/2012 2038   TCO2 25 02/05/2012 1728   ACIDBASEDEF 3.0* 02/04/2012 2038   O2SAT 90.0 02/04/2012 2038   CBG (last 3)   Basename 02/06/12 0323 02/05/12 2343 02/05/12 1931  GLUCAP 113* 122* 127*    Assessment/Plan: S/P   POD # 2 CABG CV- stable, maintaining SR RESP- continue IS for basilar atelectasis RENAL - diurese CBG well controlled Transfer to 2000   LOS: 7  days    Maxon Kresse C 02/06/2012

## 2012-02-06 NOTE — Plan of Care (Signed)
Problem: Phase III Progression Outcomes Goal: Time patient transferred to PCTU/Telemetry POD Outcome: Completed/Met Date Met:  02/06/12 Pt transferred to 2028, at 1200pm. VS stable prior and during the transfer. Report given to receiving RN, Belenda Cruise. Pt family made aware of transfer and given room number. Cheetara Hoge, Charity fundraiser.

## 2012-02-06 NOTE — Progress Notes (Signed)
CARDIAC REHAB PHASE I   PRE:  Rate/Rhythm: 81SR  BP:  Supine: 92/60  Sitting:   Standing:    SaO2: 94%4L  MODE:  Ambulation: 300 ft   POST:  Rate/Rhythem: 87  BP:  Supine: 100/62  Sitting:   Standing:    SaO2: 95%4L 1415-1455 Pt walked 300 ft on 4L with rolling walker and asst x 1. Gait steady. Became dizzy at end of walk. Assisted to bed and checked vs. Sats good. Gave pt positive reenforcement for effort in mobilizing. Having sternal pain with mobility. Had gotten pain med recently. Back to bed and going to rest.  Duanne Limerick

## 2012-02-07 ENCOUNTER — Inpatient Hospital Stay (HOSPITAL_COMMUNITY): Payer: Self-pay

## 2012-02-07 LAB — GLUCOSE, CAPILLARY
Glucose-Capillary: 75 mg/dL (ref 70–99)
Glucose-Capillary: 97 mg/dL (ref 70–99)

## 2012-02-07 LAB — BASIC METABOLIC PANEL
CO2: 29 mEq/L (ref 19–32)
Calcium: 9 mg/dL (ref 8.4–10.5)
GFR calc non Af Amer: 89 mL/min — ABNORMAL LOW (ref >90)
Potassium: 4.3 mEq/L (ref 3.5–5.1)
Sodium: 134 mEq/L — ABNORMAL LOW (ref 135–145)

## 2012-02-07 LAB — CBC
MCH: 27.9 pg (ref 26.0–34.0)
MCHC: 31.5 g/dL (ref 30.0–36.0)
Platelets: 270 10*3/uL (ref 150–400)
RBC: 3.41 MIL/uL — ABNORMAL LOW (ref 3.87–5.11)

## 2012-02-07 MED ORDER — FLUTICASONE-SALMETEROL 250-50 MCG/DOSE IN AEPB: 1.0000 | INHALATION_SPRAY | Freq: Two times a day (BID) | RESPIRATORY_TRACT | Status: DC

## 2012-02-07 MED ORDER — LEVALBUTEROL HCL 0.63 MG/3ML IN NEBU
0.6300 mg | INHALATION_SOLUTION | Freq: Three times a day (TID) | RESPIRATORY_TRACT | Status: DC
  Administered 2012-02-07 – 2012-02-09 (×4): 0.63 mg via RESPIRATORY_TRACT
  Filled 2012-02-07 (×10): qty 3

## 2012-02-07 MED ORDER — ZOLPIDEM TARTRATE 5 MG PO TABS
10.0000 mg | ORAL_TABLET | Freq: Every evening | ORAL | Status: DC | PRN
  Administered 2012-02-07 – 2012-02-09 (×3): 10 mg via ORAL
  Filled 2012-02-07 (×3): qty 2

## 2012-02-07 MED ORDER — MAGIC MOUTHWASH
5.0000 mL | Freq: Three times a day (TID) | ORAL | Status: DC | PRN
  Administered 2012-02-08: 5 mL via ORAL
  Filled 2012-02-07: qty 5

## 2012-02-07 MED ORDER — KETOROLAC TROMETHAMINE 30 MG/ML IJ SOLN
30.0000 mg | Freq: Four times a day (QID) | INTRAMUSCULAR | Status: DC | PRN
  Filled 2012-02-07: qty 1

## 2012-02-07 MED ORDER — PROMETHAZINE HCL 25 MG/ML IJ SOLN
12.5000 mg | Freq: Four times a day (QID) | INTRAMUSCULAR | Status: DC | PRN
  Administered 2012-02-07 – 2012-02-08 (×3): 12.5 mg via INTRAVENOUS
  Administered 2012-02-08: 10:00:00 via INTRAVENOUS
  Administered 2012-02-10: 12.5 mg via INTRAVENOUS
  Filled 2012-02-07 (×5): qty 1

## 2012-02-07 NOTE — Progress Notes (Signed)
CARDIAC REHAB PHASE I   PRE:  Rate/Rhythm: 80 SR  BP:  Supine: 98/60  Sitting: 103/70  Standing: 9   SaO2: 96 4L 94 2L  MODE:  Ambulation: 240 ft   POST:  Rate/Rhythem: 87  BP:  Supine:   Sitting: 104/65 in hall 90/50 after walk  Standing:    SaO2: 93 2L 1348-1430 On arrival pt in bed asking to go to bathroom. Assisted to bathroom, when she came out of bathroom pt diaphoretic c/o of feeling dizzy. Sat pt in chair checked BP 103/70. Got assistance to ambulate. Pushed chair behind pt and ambulated on 2L of O2. Gait steady, she became dizzy on walk had to sit in chair in hall. Checked BP again 104/65. Pt able to walk back to room and placed in bed with call light in reach. O2 left at 2L, reported to RN   Kizzie Bane, Nila Nephew

## 2012-02-07 NOTE — Progress Notes (Signed)
Pt ambulated 240ft with RW x 1 assist. Pt stopped once to rest, steady gait.  Will continue to monitor.

## 2012-02-07 NOTE — Progress Notes (Addendum)
Subjective:   [name] complains of shortness of breath this morning.  [name] states [name] just feels like [name] cant catch her breath especially when walking.  [name] also complains of RLE burning and sharp pains.  Objective:  Vital Signs in the last 24 hours: Temp:  [97.5 F (36.4 C)-98.1 F (36.7 C)] 97.5 F (36.4 C) (04/04 4098) Pulse Rate:  [78-84] 79  (04/04 0608) Resp:  [18-22] 19  (04/04 0608) BP: (96-114)/(55-70) 114/70 mmHg (04/04 0608) SpO2:  [90 %-96 %] 95 % (04/04 1191)  Intake/Output from previous day: 04/03 0701 - 04/04 0700 In: 818 [P.O.:600; I.V.:160; IV Piggyback:58] Out: 2075 [Urine:2075] Intake/Output from this shift:    Physical Exam: General appearance: alert, cooperative and no distress Lungs: diminished breath sounds bibasilar Heart: regular rate and rhythm, S1, S2 normal, no murmur, click, rub or gallop Abdomen: soft, non-tender; bowel sounds normal; no masses,  no organomegaly Extremities: edema 2+, RLE small hematoma on ankle Skin: incisions healing well  Lab Results:  Basename 02/07/12 0541 02/06/12 0435  WBC 16.4* 13.4*  HGB 9.5* 8.2*  PLT 270 205    Basename 02/07/12 0541 02/06/12 0435  NA 134* 133*  K 4.3 4.3  CL 95* 99  CO2 29 27  GLUCOSE 106* 109*  BUN 11 9  CREATININE 0.78 0.76   No results found for this basename: TROPONINI:2,CK,MB:2 in the last 72 hours Hepatic Function Panel No results found for this basename: PROT,ALBUMIN,AST,ALT,ALKPHOS,BILITOT,BILIDIR,IBILI in the last 72 hours No results found for this basename: CHOL in the last 72 hours No results found for this basename: PROTIME in the last 72 hours  Imaging: Imaging results have been reviewed: Pleural effusion on the left, atelectasis bilaterally  Assessment/Plan:   1. S/P CABG 2. CV- NSR rate in the 70s, blood pressure well controlled, will continue Coreg 3. Resp-diminished breath sounds bilaterally, pleural effusion/atelectasis on CXR, will order Xopenex nebulizer  treatments, reorder home advair, encourage IS 4. Fluid Balance- good UO/ patients weight is up about 8kg since admission, will continue diuresis 5. CBGs well controlled    LOS: 8 days    BARRETT, ERIN 02/07/2012, 8:50 AM   Feels better this afternoon [name] is complaining of a sore in her mouth- will give magic mouthwash

## 2012-02-08 DIAGNOSIS — Z951 Presence of aortocoronary bypass graft: Secondary | ICD-10-CM

## 2012-02-08 LAB — GLUCOSE, CAPILLARY: Glucose-Capillary: 86 mg/dL (ref 70–99)

## 2012-02-08 MED ORDER — TRAMADOL HCL 50 MG PO TABS: 50.0000 mg | ORAL_TABLET | ORAL | Status: AC | PRN

## 2012-02-08 MED ORDER — POTASSIUM CHLORIDE CRYS ER 20 MEQ PO TBCR: 20.0000 meq | EXTENDED_RELEASE_TABLET | Freq: Two times a day (BID) | ORAL | Status: DC

## 2012-02-08 MED ORDER — SODIUM CHLORIDE 0.9 % IJ SOLN
10.0000 mL | INTRAMUSCULAR | Status: DC | PRN
  Administered 2012-02-08: 10 mL
  Administered 2012-02-08: 20 mL
  Administered 2012-02-09 – 2012-02-10 (×5): 10 mL

## 2012-02-08 MED ORDER — LOSARTAN POTASSIUM 50 MG PO TABS: 50.0000 mg | ORAL_TABLET | Freq: Every day | ORAL | Status: DC

## 2012-02-08 MED ORDER — FUROSEMIDE 10 MG/ML IJ SOLN
40.0000 mg | Freq: Once | INTRAMUSCULAR | Status: AC
  Administered 2012-02-08: 40 mg via INTRAVENOUS
  Filled 2012-02-08: qty 4

## 2012-02-08 MED ORDER — FUROSEMIDE 40 MG PO TABS: 40.0000 mg | ORAL_TABLET | Freq: Every day | ORAL | Status: DC

## 2012-02-08 MED ORDER — CARVEDILOL 3.125 MG PO TABS: 3.1250 mg | ORAL_TABLET | Freq: Two times a day (BID) | ORAL | Status: DC

## 2012-02-08 MED ORDER — OXYCODONE HCL 5 MG PO TABS: 5.0000 mg | ORAL_TABLET | ORAL | Status: AC | PRN

## 2012-02-08 NOTE — Discharge Instructions (Signed)
Coronary Artery Bypass Grafting Care After Refer to this sheet in the next few weeks. These instructions provide you with information on caring for yourself after your procedure. Your caregiver may also give you more specific instructions. Your treatment has been planned according to current medical practices, but problems sometimes occur. Call your caregiver if you have any problems or questions after your procedure.  Recovery from open heart surgery will be different for everyone. Some people feel well after 3 or 4 weeks, while for others it takes longer. After heart surgery, it may be normal to:  Not have an appetite, feel nauseated by the smell of food, or only want to eat a small amount.   Be constipated because of changes in your diet, activity, and medicines. Eat foods high in fiber. Add fresh fruits and vegetables to your diet. Stool softeners may be helpful.   Feel sad or unhappy. You may be frustrated or cranky. You may have good days and bad days. Do not give up. Talk to your caregiver if you do not feel better.   Feel weakness and fatigue. You many need physical therapy or cardiac rehabilitation to get your strength back.   Develop an irregular heartbeat called atrial fibrillation. Symptoms of atrial fibrillation are a fast, irregular heartbeat or feelings of fluttery heartbeats, shortness of breath, low blood pressure, and dizziness. If these symptoms develop, see your caregiver right away.  MEDICATION  Have a list of all the medicines you will be taking when you leave the hospital. For every medicine, know the following:   Name.   Exact dose.   Time of day to be taken.   How often it should be taken.   Why you are taking it.   Ask which medicines should or should not be taken together. If you take more than one heart medicine, ask if it is okay to take them together. Some heart medicines should not be taken at the same time because they may lower your blood pressure too  much.   Narcotic pain medicine can cause constipation. Eat fresh fruits and vegetables. Add fiber to your diet. Stool softener medicine may help relieve constipation.   Keep a copy of your medicines with you at all times.   Do not add or stop taking any medicine until you check with your caregiver.   Medicines can have side effects. Call your caregiver who prescribed the medicine if you:   Start throwing up, have diarrhea, or have stomach pain.   Feel dizzy or lightheaded when you stand up.   Feel your heart is skipping beats or is beating too fast or too slow.   Develop a rash.   Notice unusual bruising or bleeding.  HOME CARE INSTRUCTIONS  After heart surgery, it is important to learn how to take your pulse. Have your caregiver show you how to take your pulse.   Use your incentive spirometer. Ask your caregiver how long after surgery you need to use it.  Care of your chest incision  Tell your caregiver right away if you notice clicking in your chest (sternum).   Support your chest with a pillow or your arms when you take deep breaths and cough.   Follow your caregiver's instructions about when you can bathe or swim.   Protect your incision from sunlight during the first year to keep the scar from getting dark.   Tell your caregiver if you notice:   Increased tenderness of your incision.   Increased redness   or swelling around your incision.   Drainage or pus from your incision.  Care of your leg incision(s)  Avoid crossing your legs.   Avoid sitting for long periods of time. Change positions every half hour.   Elevate your leg(s) when you are sitting.   Check your leg(s) daily for swelling. Check the incisions for redness or drainage.   Wear your elastic stockings as told by your caregiver. Take them off at bedtime.  Diet  Diet is very important to heart health.   Eat plenty of fresh fruits and vegetables. Meats should be lean cut. Avoid canned, processed, and  fried foods.   Talk to a dietician. They can teach you how to make healthy food and drink choices.  Weight  Weigh yourself every day. This is important because it helps to know if you are retaining fluid that may make your heart and lungs work harder.   Use the same scale each time.   Weigh yourself every morning at the same time. You should do this after you go to the bathroom, but before you eat breakfast.   Your weight will be more accurate if you do not wear any clothes.   Record your weight.   Tell your caregiver if you have gained 2 pounds or more overnight.  Activity Stop any activity at once if you have chest pain, shortness of breath, irregular heartbeats, or dizziness. Get help right away if you have any of these symptoms.  Bathing.  Avoid soaking in a bath or hot tub until your incisions are healed.   Rest. You need a balance of rest and activity.   Exercise. Exercise per your caregiver's advice. You may need physical therapy or cardiac rehabilitation to help strengthen your muscles and build your endurance.   Climbing stairs. Unless your caregiver tells you not to climb stairs, go up stairs slowly and rest if you tire. Do not pull yourself up by the handrail.   Driving a car. Follow your caregiver's advice on when you may drive. You may ride as a passenger at any time. When traveling for long periods of time in a car, get out of the car and walk around for a few minutes every 2 hours.   Lifting. Avoid lifting, pushing, or pulling anything heavier than 10 pounds for 6 weeks after surgery or as told by your caregiver.   Returning to work. Check with your caregiver. People heal at different rates. Most people will be able to go back to work 6 to 12 weeks after surgery.   Sexual activity. You may resume sexual relations as told by your caregiver.  SEEK MEDICAL CARE IF:  Any of your incisions are red, painful, or have any type of drainage coming from them.   You have an  oral temperature above 102 F (38.9 C).   You have ankle or leg swelling.   You have pain in your legs.   You have weight gain of 2 or more pounds a day.   You feel dizzy or lightheaded when you stand up.  SEEK IMMEDIATE MEDICAL CARE IF:  You have angina or chest pain that goes to your jaw or arms. Call your local emergency services right away.   You have shortness of breath at rest or with activity.   You have a fast or irregular heartbeat (arrhythmia).   There is a "clicking" in your sternum when you move.   You have numbness or weakness in your arms or legs.    MAKE SURE YOU:  Understand these instructions.   Will watch your condition.   Will get help right away if you are not doing well or get worse.  Document Released: 05/11/2005 Document Revised: 10/11/2011 Document Reviewed: 12/27/2010 [employer] Patient Information 2012 [employer]  Endoscopic Saphenous Vein Harvesting Care After Refer to this sheet in the next few weeks. These instructions provide you with information on caring for yourself after your procedure. Your caregiver may also give you more specific instructions. Your treatment has been planned according to current medical practices, but problems sometimes occur. Call your caregiver if you have any problems or questions after your procedure. HOME CARE INSTRUCTIONS Medicine  Take whatever pain medicine your surgeon prescribes. Follow the directions carefully. Do not take over-the-counter pain medicine unless your surgeon says it is okay. Some pain medicine can cause bleeding problems for several weeks after surgery.   Follow your surgeon's instructions about driving. You will probably not be permitted to drive after heart surgery.   Take any medicines your surgeon prescribes. Any medicines you took before your heart surgery should be checked with your caregiver before you start taking them again.  Wound care  Ask your surgeon how long you should keep  wearing your elastic bandage or stocking.   Check the area around your surgical cuts (incisions) whenever your bandages (dressings) are changed. Look for any redness or swelling.   You will need to return to have the stitches (sutures) or staples taken out. Ask your surgeon when to do that.   Ask your surgeon when you can shower or bathe.  Activity  Try to keep your legs raised when you are sitting.   Do any exercises your caregivers have given you. These may include deep breathing exercises, coughing, walking, or other exercises.  SEEK MEDICAL CARE IF:  You have any questions about your medicines.   You have more leg pain, especially if your pain medicine stops working.   New or growing bruises develop on your leg.   Your leg swells, feels tight, or becomes red.   You have numbness in your leg.  SEEK IMMEDIATE MEDICAL CARE IF:  Your pain gets much worse.   Blood or fluid leaks from any of the incisions.   Your incisions become warm, swollen, or red.   You have chest pain.   You have trouble breathing.   You have a fever.   You have more pain near your leg incision.  MAKE SURE YOU:  Understand these instructions.   Will watch your condition.   Will get help right away if you are not doing well or get worse.  Document Released: 07/04/2011 Document Revised: 10/11/2011 Document Reviewed: 07/04/2011 [employer] Patient Information 2012 [employer]. 

## 2012-02-08 NOTE — Discharge Summary (Signed)
Physician Discharge Summary  Patient ID: [name] MRN: [id number] DOB/AGE: [id number] y.o.  Admit date: 01/30/2012 Discharge date: 02/08/2012  Admission Diagnoses:  Patient Active Problem List  Diagnoses  . HYPOTHYROIDISM  . GLUCOSE INTOLERANCE  . HYPERLIPIDEMIA  . HYPOKALEMIA  . OBESITY  . ANEMIA-IRON DEFICIENCY  . ANXIETY  . SOMATIZATION DISORDER  . HYPERTENSION  . MYOCARDIAL INFARCTION, HX OF  . CORONARY ARTERY DISEASE  . PERIPHERAL VASCULAR DISEASE  . ALLERGIC RHINITIS  . ASTHMA  . COPD  . GERD  . IBS  . RENAL INSUFFICIENCY  . BREAST MASS, LEFT  . MENOPAUSAL DISORDER  . OSTEOARTHRITIS  . LOW BACK PAIN  . FIBROMYALGIA  . FATIGUE, CHRONIC  . PERIPHERAL EDEMA  . SHORTNESS OF BREATH  . CHEST PAIN  . COLONIC POLYPS, HX OF  . DIVERTICULITIS, HX OF  . CAD (coronary artery disease)  . NSTEMI (non-ST elevated myocardial infarction)   Discharge Diagnoses:   Patient Active Problem List  Diagnoses  . HYPOTHYROIDISM  . GLUCOSE INTOLERANCE  . HYPERLIPIDEMIA  . HYPOKALEMIA  . OBESITY  . ANEMIA-IRON DEFICIENCY  . ANXIETY  . SOMATIZATION DISORDER  . HYPERTENSION  . MYOCARDIAL INFARCTION, HX OF  . CORONARY ARTERY DISEASE  . PERIPHERAL VASCULAR DISEASE  . ALLERGIC RHINITIS  . ASTHMA  . COPD  . GERD  . IBS  . RENAL INSUFFICIENCY  . BREAST MASS, LEFT  . MENOPAUSAL DISORDER  . OSTEOARTHRITIS  . LOW BACK PAIN  . FIBROMYALGIA  . FATIGUE, CHRONIC  . PERIPHERAL EDEMA  . SHORTNESS OF BREATH  . CHEST PAIN  . COLONIC POLYPS, HX OF  . DIVERTICULITIS, HX OF  . CAD (coronary artery disease)  . NSTEMI (non-ST elevated myocardial infarction)  . S/P CABG x 5   Discharged Condition: good  Hospital Course:   Mrs. [name] is a 61 yo female with known history of CAD.  She is S/P inferior wall myocardial infarcation and PCI x2 to her RCA performed in 2007.  The patient developed chest pain in 08/2011 at which time she was found to be suffering a STEMI.  She was  taken emergently to the cath lab at which time she was found to have a vessel occlusion, and medical therapy was recommended.  The patient presented to the ED on 01/30/2012 with a complaint of a several day complaint of chest pain.  The patient rated the pain as being severe that she could not take it anymore and presented for evaluation.  The patient states the pain is identical to her previous cardiac episodes.  She took NTG with no relief.  Workup revealed the patient was experiencing a NSTEMI.  She was admitted to Cardiology service and placed on IV Heparin and Nitroglycerin drips.  Despite medications patient continued to experience chest pain.  The patient was placed in the Cardiac Care Unit and taken for cardiac catheterization.  This revealed three vessel CAD.  It was felt surgery presented the best option for this patient and cardiothoracic surgery was consulted. Dr. Dorris Fetch evaluated the patient and felt she would be a candidate for bypass surgery.  He discuss the risks and benefits with the patient and her family.  The patient was agreeable to proceed.  The patient's surgery was scheduled for 02/04/2012 to allow the Plavix to be digested by the patients system.  On 02/04/2012 the patient was taken to the OR and underwent CABG x5 utilizing LIMA to LAD, SVG to RI, SVG to OM1, and sequential SVG  to PD and PLVB.  The patient also underwent Endoscopic Saphenous Vein Harvest of her right leg.  The patient tolerated the procedure well and was taken to the surgical ICU in stable condition.  POD #0 the patient was extubated without difficulty.  POD #1 the patients chest tube was removed without difficulty.  She was weaned off her neo as tolerated.  POD #2 the patients was transferred to the stepdown unit.  The patients follow up CXR revealed no evidence of pneumothorax.  There is bilateral atelectasis and pleural effusions.  POD #3 the patient complained of shortness of breath.  She was re-started on her home  advair and placed on Xopenex nebulizer treatments.  POD #4 the patient's shortness of breath has improved.  She was weaned off her oxygen.  At this time she is medically stable.  Should no problems arise, we will discontinue her external pacing wires in the morning and is potentially ready for discharge in the next 24-48 hrs.  Disposition: 01-Home or Self Care  Discharge Orders    Future Appointments: Provider: Department: Dept Phone: Center:   02/27/2012 12:30 PM Loreli Slot, MD [facility] [phone] [facility]     Medication List  As of 02/08/2012  4:51 PM   STOP taking these medications         isosorbide mononitrate 60 MG 24 hr tablet      nitroGLYCERIN 0.4 MG SL tablet         TAKE these medications         albuterol 108 (90 BASE) MCG/ACT inhaler   Commonly known as: PROVENTIL HFA;VENTOLIN HFA   Inhale 2 puffs into the lungs every 6 (six) hours as needed. Shortness of breath      aspirin 325 MG tablet   Take 325 mg by mouth daily.      carvedilol 3.125 MG tablet   Commonly known as: COREG   Take 1 tablet (3.125 mg total) by mouth 2 (two) times daily with a meal.      clopidogrel 75 MG tablet   Commonly known as: PLAVIX   Take 1 tablet (75 mg total) by mouth daily.      Fluticasone-Salmeterol 250-50 MCG/DOSE Aepb   Commonly known as: ADVAIR   Inhale 1 puff into the lungs every 12 (twelve) hours as needed. Shortness of breath      furosemide 40 MG tablet   Commonly known as: LASIX   Take 1 tablet (40 mg total) by mouth daily.      levothyroxine 125 MCG tablet   Commonly known as: SYNTHROID, LEVOTHROID   TAKE 1 TABLET BY MOUTH ONCE A DAY      losartan 100 MG tablet   Commonly known as: COZAAR   Take 50 mg by mouth daily.      losartan 50 MG tablet   Commonly known as: COZAAR   Take 1 tablet (50 mg total) by mouth daily.      metaxalone 800 MG tablet   Commonly known as: SKELAXIN   Take 800 mg by mouth 2 (two) times daily as needed.      omeprazole 20  MG capsule   Commonly known as: PRILOSEC   Take 20 mg by mouth 2 (two) times daily.      oxyCODONE 5 MG immediate release tablet   Commonly known as: Oxy IR/ROXICODONE   Take 1-2 tablets (5-10 mg total) by mouth every 4 (four) hours as needed.      potassium chloride SA  20 MEQ tablet   Commonly known as: K-DUR,KLOR-CON   Take 1 tablet (20 mEq total) by mouth 2 (two) times daily.      pravastatin 40 MG tablet   Commonly known as: PRAVACHOL   Take 1 tablet (40 mg total) by mouth daily.      traMADol 50 MG tablet   Commonly known as: ULTRAM   Take 1-2 tablets (50-100 mg total) by mouth every 4 (four) hours as needed.           Follow-up Information    Follow up with [name], MD. Schedule an appointment as soon as possible for a visit in 2 weeks.   Contact information:   [address] [place] [address] [phone]       Follow up with Loreli Slot, MD on 02/27/2012. (Appointment is at 1230pm, please have chest xray performed at 1130AM)    Contact information:   [address] [place] [address] [phone]          Signed: Lowella Dandy 02/08/2012, 4:51 PM

## 2012-02-08 NOTE — Progress Notes (Signed)
CARDIAC REHAB PHASE I   PRE:  Rate/Rhythm: 93SR  BP:  Supine: 100/62  Sitting:   Standing:    SaO2: 93%2L  MODE:  Ambulation: 550 ft   POST:  Rate/Rhythem: 108  BP:  Supine:   Sitting: 106/60  Standing:    SaO2: 88-89%hall, 88%room  Put back on 2L [id number] Pt feeling much better today. Walked 550 ft on RA with asst x 2. Took several standing rest breaks. Checked sats in hallway at 88-89%.  To recliner after walk. Put back on oxygen due to sats at 88%RA. Encouraged IS.  Duanne Limerick

## 2012-02-08 NOTE — Progress Notes (Addendum)
Subjective:  [name] is doing better this morning.  She has been weaned off her oxygen and is no longer complaining of shortness of breath.  Objective:  Vital Signs in the last 24 hours: Temp:  [97.7 F (36.5 C)-98.3 F (36.8 C)] 97.7 F (36.5 C) (04/05 0438) Pulse Rate:  [80-96] 96  (04/05 0438) Resp:  [18-19] 18  (04/05 0438) BP: (98-124)/(60-75) 115/75 mmHg (04/05 0438) SpO2:  [93 %-95 %] 93 % (04/05 0438) Weight:  [207 lb 0.2 oz (93.9 kg)] 207 lb 0.2 oz (93.9 kg) (04/05 0438)  Intake/Output from previous day: 04/04 0701 - 04/05 0700 In: 600 [P.O.:600] Out: -  Intake/Output from this shift:    Physical Exam: General appearance: alert and no distress Lungs: clear to auscultation bilaterally Heart: regular rate and rhythm Abdomen: soft, non-tender; bowel sounds normal; no masses,  no organomegaly Extremities: edema 1+ Skin: inicisions healing well  Lab Results:  Basename 02/07/12 0541 02/06/12 0435  WBC 16.4* 13.4*  HGB 9.5* 8.2*  PLT 270 205    Basename 02/07/12 0541 02/06/12 0435  NA 134* 133*  K 4.3 4.3  CL 95* 99  CO2 29 27  GLUCOSE 106* 109*  BUN 11 9  CREATININE 0.78 0.76   No results found for this basename: TROPONINI:2,CK,MB:2 in the last 72 hours Hepatic Function Panel No results found for this basename: PROT,ALBUMIN,AST,ALT,ALKPHOS,BILITOT,BILIDIR,IBILI in the last 72 hours No results found for this basename: CHOL in the last 72 hours No results found for this basename: PROTIME in the last 72 hours  Assessment/Plan:   1. S/P CABG 2. CV- NSR, blood pressure well controlled, continue Coreg 3. Resp- dyspnea improved, will continue Xopenex nebulizer treatments and daily advair and proventil 4. Fluid- patients weight remains up, will continue diuresis 5. Dispo- patient doing much better than yesterday.  If no arrythmia develops will d/c EPW in AM, likely d/c home in the next 24-48 hours    LOS: 9 days    BARRETT, ERIN 02/08/2012, 1:08  PM    Patient seen and examined. Agree with above. She is still on and off O2 and is still edematous. Will give IV lasix this afternoon

## 2012-02-09 LAB — GLUCOSE, CAPILLARY: Glucose-Capillary: 89 mg/dL (ref 70–99)

## 2012-02-09 MED ORDER — INSULIN GLARGINE 100 UNIT/ML ~~LOC~~ SOLN: 20.0000 [IU] | SUBCUTANEOUS | Status: DC

## 2012-02-09 MED ORDER — LEVALBUTEROL HCL 0.63 MG/3ML IN NEBU
0.6300 mg | INHALATION_SOLUTION | Freq: Three times a day (TID) | RESPIRATORY_TRACT | Status: DC | PRN
  Filled 2012-02-09: qty 3

## 2012-02-09 NOTE — Progress Notes (Addendum)
                    [address]            [place] [address]          [phone]     10 Days Post-Op    Subjective: No complaints.  Still on 2L O2.  Denies SOB this am.  Objective: Vital signs in last 24 hours: Patient Vitals for the past 24 hrs:  BP Temp Temp src Pulse Resp SpO2 Weight  02/09/12 0810 - - - - - 95 % -  02/09/12 0630 - - - - - 96 % -  02/09/12 0541 120/82 mmHg 98.5 F (36.9 C) Tympanic 107  18  95 % 92.126 kg (203 lb 1.6 oz)  02/08/12 2103 106/68 mmHg 98 F (36.7 C) Oral 81  18  95 % -  02/08/12 1953 - - - - - 90 % -  02/08/12 1422 101/69 mmHg 98.9 F (37.2 C) Oral 89  18  93 % -   Current Weight  02/09/12 92.126 kg (203 lb 1.6 oz)  PRE-OPERATIVE WEIGHT: 94kg    Intake/Output from previous day: 04/05 0701 - 04/06 0700 In: 204 [P.O.:200; IV Piggyback:4] Out: 400 [Urine:400]  CBGs 101-98  PHYSICAL EXAM:  Heart: RRR Lungs: clear, slightly decreased in bases Wound: clean and dry Extremities: mild LE edema, R>L  Lab Results: CBC: Basename 02/07/12 0541  WBC 16.4*  HGB 9.5*  HCT 30.2*  PLT 270   BMET:  Basename 02/07/12 0541  NA 134*  K 4.3  CL 95*  CO2 29  GLUCOSE 106*  BUN 11  CREATININE 0.78  CALCIUM 9.0    PT/INR: No results found for this basename: LABPROT,INR in the last 72 hours   Assessment/Plan: S/P  CABG- CV- stable, SR. Continue current meds Pulm- still on 2L. Will try to wean and d/c. Vol overload- continue diuresis. Wt down, but UOP marginal yesterday. Leukocytosis, no fever.  Follow WBC Endo-  A1C=5.8, CBGs ok.  Still on large dose of Lantus.  Will decrease dose, ? Needs po agent for home.  D/c EPWs ?home soon   LOS: 10 days    COLLINS,GINA H 02/09/2012   Patient seen and examined. Agree with above. She feels much better this afternoon and was able to ambulate without O2 Getting EPW removed Home in AM if still off O2 D/c lantus

## 2012-02-09 NOTE — Progress Notes (Signed)
[id number] Cardiac Rehab Pt has ambulated without O2 today in hall denies any SOB. O2 off at present. Encouraged pt to call if she feels SOB so that we can check O2 sat. Pt is now on bedrest PW discontinued Completed discharge education with pt and husband. Pt agrees to Outpt. CRP i [place], will send referral..

## 2012-02-09 NOTE — Progress Notes (Signed)
Dc'd EPW's and chest tube sutures per hospital policy.  Wires intact and pt tolerated well.  Instructed patient to stay in bed for 1 hour or until 3pm.  RN will continue to monitor patient.

## 2012-02-10 LAB — CBC
HCT: 31.7 % — ABNORMAL LOW (ref 36.0–46.0)
MCH: 27.1 pg (ref 26.0–34.0)
MCV: 88.5 fL (ref 78.0–100.0)
RBC: 3.58 MIL/uL — ABNORMAL LOW (ref 3.87–5.11)
WBC: 14.7 10*3/uL — ABNORMAL HIGH (ref 4.0–10.5)

## 2012-02-10 LAB — GLUCOSE, CAPILLARY: Glucose-Capillary: 107 mg/dL — ABNORMAL HIGH (ref 70–99)

## 2012-02-10 MED ORDER — METAXALONE 800 MG PO TABS: 800.0000 mg | ORAL_TABLET | Freq: Three times a day (TID) | ORAL | Status: AC | PRN

## 2012-02-10 MED ORDER — LEVOTHYROXINE SODIUM 125 MCG PO TABS: 125.0000 ug | ORAL_TABLET | Freq: Every day | ORAL | Status: DC

## 2012-02-10 NOTE — Progress Notes (Signed)
DC'd patient home with husband.  RN reviewed DC instructions and Med rec with pt and husband.  RN removed central line per hospital policy with catheter intact.  Pt and husband verbalized understanding of follow up appointments with MD.

## 2012-02-10 NOTE — Progress Notes (Signed)
                    [address]            [place] [address]          [phone]     11 Days Post-Op    Subjective: Feels well.  Sat >90% of O2 and breathing stable.  Objective: Vital signs in last 24 hours: Patient Vitals for the past 24 hrs:  BP Temp Temp src Pulse Resp SpO2 Weight  02/10/12 0805 - - - - - 92 % -  02/10/12 0517 120/79 mmHg 98.7 F (37.1 C) Oral 104  17  91 % 91.581 kg (201 lb 14.4 oz)  02/09/12 1954 - - - - - 94 % -  02/09/12 1938 122/77 mmHg 100.2 F (37.9 C) Oral 100  17  90 % -  02/09/12 1533 99/69 mmHg 98.5 F (36.9 C) Oral 93  16  92 % -   Current Weight  02/10/12 91.581 kg (201 lb 14.4 oz)     Intake/Output from previous day:   CBGs 111-107   PHYSICAL EXAM:  Heart: RRR Lungs: clear Wound:clean and dry Extremities:  R>L LE edema   Lab Results: CBC: Basename 02/10/12 0545  WBC 14.7*  HGB 9.7*  HCT 31.7*  PLT 442*     Assessment/Plan: S/P CABG-  Discharge home today.  Instructions reviewed with patient.   LOS: 11 days    Heber Hoog H 02/10/2012

## 2012-02-10 NOTE — Progress Notes (Signed)
Patient given IV phenergan for nausea due to receiving her pain medication. Will continue to monitor.

## 2012-02-11 MED FILL — Electrolyte-R (PH 7.4) Solution: INTRAVENOUS | Qty: 5000 | Status: AC

## 2012-02-11 MED FILL — Heparin Sodium (Porcine) Inj 1000 Unit/ML: INTRAMUSCULAR | Qty: 60 | Status: AC

## 2012-02-11 MED FILL — Sodium Bicarbonate IV Soln 8.4%: INTRAVENOUS | Qty: 50 | Status: AC

## 2012-02-11 MED FILL — Lidocaine HCl IV Inj 20 MG/ML: INTRAVENOUS | Qty: 5 | Status: AC

## 2012-02-11 MED FILL — Mannitol IV Soln 20%: INTRAVENOUS | Qty: 500 | Status: AC

## 2012-02-11 MED FILL — Heparin Sodium (Porcine) Inj 1000 Unit/ML: INTRAMUSCULAR | Qty: 10 | Status: AC

## 2012-02-11 MED FILL — Sodium Chloride IV Soln 0.9%: INTRAVENOUS | Qty: 1000 | Status: AC

## 2012-02-12 ENCOUNTER — Telehealth: Payer: Self-pay | Admitting: Pharmacist

## 2012-02-12 NOTE — Telephone Encounter (Signed)
Spoke with pt's husband.  She is doing well since hospitalization.  They have not been able to schedule an appt with Dr. Eden Emms yet.  Spoke with scheduling team, they were going to work with the husband to make sure the appt is set up.

## 2012-02-15 ENCOUNTER — Encounter: Payer: Self-pay | Admitting: Thoracic Surgery (Cardiothoracic Vascular Surgery)

## 2012-02-21 ENCOUNTER — Other Ambulatory Visit: Payer: Self-pay | Admitting: Thoracic Surgery (Cardiothoracic Vascular Surgery)

## 2012-02-21 DIAGNOSIS — I2510 Atherosclerotic heart disease of native coronary artery without angina pectoris: Secondary | ICD-10-CM

## 2012-02-25 ENCOUNTER — Encounter: Payer: Self-pay | Admitting: Physician Assistant

## 2012-02-25 ENCOUNTER — Ambulatory Visit (INDEPENDENT_AMBULATORY_CARE_PROVIDER_SITE_OTHER): Payer: Self-pay | Admitting: Physician Assistant

## 2012-02-25 VITALS — BP 120/70 | HR 98 | Ht 60.0 in | Wt 195.8 lb

## 2012-02-25 DIAGNOSIS — J449 Chronic obstructive pulmonary disease, unspecified: Secondary | ICD-10-CM

## 2012-02-25 DIAGNOSIS — R079 Chest pain, unspecified: Secondary | ICD-10-CM

## 2012-02-25 DIAGNOSIS — K219 Gastro-esophageal reflux disease without esophagitis: Secondary | ICD-10-CM

## 2012-02-25 DIAGNOSIS — E785 Hyperlipidemia, unspecified: Secondary | ICD-10-CM

## 2012-02-25 DIAGNOSIS — I10 Essential (primary) hypertension: Secondary | ICD-10-CM

## 2012-02-25 DIAGNOSIS — J4489 Other specified chronic obstructive pulmonary disease: Secondary | ICD-10-CM

## 2012-02-25 DIAGNOSIS — I2510 Atherosclerotic heart disease of native coronary artery without angina pectoris: Secondary | ICD-10-CM

## 2012-02-25 MED ORDER — ASPIRIN 81 MG PO TABS: 325.0000 mg | ORAL_TABLET | Freq: Every day | ORAL | Status: DC

## 2012-02-25 MED ORDER — FAMOTIDINE 20 MG PO TABS: 20.0000 mg | ORAL_TABLET | Freq: Every day | ORAL | Status: DC

## 2012-02-25 MED ORDER — FUROSEMIDE 40 MG PO TABS: 40.0000 mg | ORAL_TABLET | ORAL | Status: DC | PRN

## 2012-02-25 MED ORDER — CARVEDILOL 6.25 MG PO TABS: 6.2500 mg | ORAL_TABLET | Freq: Two times a day (BID) | ORAL | Status: DC

## 2012-02-25 MED ORDER — POTASSIUM CHLORIDE CRYS ER 20 MEQ PO TBCR: 20.0000 meq | EXTENDED_RELEASE_TABLET | ORAL | Status: DC | PRN

## 2012-02-25 MED ORDER — FAMOTIDINE 20 MG PO TABS: 20.0000 mg | ORAL_TABLET | Freq: Two times a day (BID) | ORAL | Status: DC

## 2012-02-25 MED ORDER — ASPIRIN 81 MG PO TABS: 81.0000 mg | ORAL_TABLET | Freq: Every day | ORAL | Status: DC

## 2012-02-25 NOTE — Patient Instructions (Signed)
Your physician recommends that you schedule a follow-up appointment in: 1 month with Dr Eden Emms  Your physician has recommended you make the following change in your medication: DECREASE Aspirin to 81 mg daily and Furosemide and Potassium to only when needed for swelling or 3 lbs weight gain for a day  INCREASE Carvedilol 6.25 mg twice daily START Pepcid 20 mg daily Your physician recommends that you have lab work drawn today (BMP)  KEEP your appointment with the Dr Dorris Fetch Try to make an appointment with GI as soon as possible

## 2012-02-25 NOTE — Progress Notes (Signed)
[address] [place], [place]  [address] Phone: [phone] Fax:  [phone]  Date:  02/25/2012   Name:  [name]       DOB:  [birth year] MRN:  [id number]  PCP:  Oliver Barre, MD, MD  Primary Cardiologist:  Dr. [name]  Primary Electrophysiologist:  None    History of Present Illness: [name] is a 62 y.o. female who presents for post hospital follow up.  She has a h/o CAD, HTN, hyperlipidemia, glucose intol, hypothyroidism, COPD, GERD, renal insufficiency.  She suffered an inf MI in 2007 c/b hypotension and brady requiring pressors and temp pacer that was treated with DES x 2 to the RCA.  She had an inf STEMI 10/12 with occluded PL felt to be the culprit.  It was small and medical therapy was continued.  She was admitted 3/27-4/5 with a NSTEMI.  LHC demonstrated 3v CAD with EF 45%.  Echo 01/30/12: mild LVH, inf HK, EF 55-60%, grade 1 diast dysfxn, mild AS, mean 11 mmHg, mild LAE.  CABG was rec.  Pre CABG dopplers neg for ICA stenosis.  Went to OR 02/04/12 with Dr. Dorris Fetch:  L-LAD, S-RI, S-OM1, S-PD and PLVB.  Post op course uneventful.  She maintained NSR.    She continues to have chest pain.  She notes a sharp pain that lasts seconds.  It seems to come on with ingestion of spicy foods.  Notes pain in her left arm and jaw.  Notes assoc dyspnea.  Sleeps on 3 pillows.  No PND.  No exertional chest pain.  Exercise tolerance is quite limited.  Probably describes Class 3 symptoms.  Of note, she had these same symptoms of chest pain right after her surgery in the hospital (ie they never resolved).    Past Medical History  Diagnosis Date  . PERIPHERAL VASCULAR DISEASE 07/15/2009  . CAD (coronary artery disease)     1. s/p inf MI 2007 c/b low BP and brady, req temp pacer and pressors;  tx with DES x 2 to RCA;  2. inf STEMI 10/12 - PL occluded but small, med Rx recommended;  3.  NSTEMI 01/2012 - LHC with 3v CAD adn EF 45%; Echo with mild LVH, inf HK, EF  55-605, grade 1 diast dysfxn, mild AS;  4. s/p CABG 02/04/12: L-LAD, S-RI, S-OM1, S-PD/PLVB ([facility])  . HYPERTENSION 08/23/2007  . HYPERLIPIDEMIA 08/23/2007  . Somatization disorder 08/23/2007  . RENAL INSUFFICIENCY 03/02/2009  . PERIPHERAL EDEMA   . OSTEOARTHRITIS   . OBESITY   . MENOPAUSAL DISORDER   . LOW BACK PAIN   . IBS   . HYPOTHYROIDISM   . GLUCOSE INTOLERANCE   . GERD   . FIBROMYALGIA   . FATIGUE, CHRONIC   . DIVERTICULITIS, HX OF   . COPD   . COLONIC POLYPS, HX OF   . BREAST MASS, LEFT   . ASTHMA   . ANXIETY   . ANEMIA-IRON DEFICIENCY   . ALLERGIC RHINITIS   . Stroke     Current Outpatient Prescriptions  Medication Sig Dispense Refill  . albuterol (PROVENTIL HFA;VENTOLIN HFA) 108 (90 BASE) MCG/ACT inhaler Inhale 2 puffs into the lungs every 6 (six) hours as needed. Shortness of breath      . aspirin 325 MG tablet Take 325 mg by mouth daily.      . carvedilol (COREG) 3.125 MG tablet Take 1 tablet (3.125 mg total) by mouth 2 (two) times daily with a meal.  60 tablet  1  . clopidogrel (PLAVIX) 75 MG tablet Take 1 tablet (75 mg total) by mouth daily.  30 tablet  12  . Fluticasone-Salmeterol (ADVAIR) 250-50 MCG/DOSE AEPB Inhale 1 puff into the lungs every 12 (twelve) hours as needed. Shortness of breath      . furosemide (LASIX) 40 MG tablet Take 1 tablet (40 mg total) by mouth daily.  30 tablet  0  . levothyroxine (SYNTHROID, LEVOTHROID) 125 MCG tablet TAKE 1 TABLET BY MOUTH ONCE A DAY  30 tablet  0  . levothyroxine (SYNTHROID, LEVOTHROID) 125 MCG tablet Take 1 tablet (125 mcg total) by mouth daily before breakfast.  30 tablet  1  . losartan (COZAAR) 100 MG tablet Take 50 mg by mouth daily.      Marland Kitchen losartan (COZAAR) 50 MG tablet Take 1 tablet (50 mg total) by mouth daily.  30 tablet  1  . metaxalone (SKELAXIN) 800 MG tablet Take 800 mg by mouth 2 (two) times daily as needed.       Marland Kitchen omeprazole (PRILOSEC) 20 MG capsule Take 20 mg by mouth 2 (two) times daily.        .  potassium chloride SA (K-DUR,KLOR-CON) 20 MEQ tablet Take 1 tablet (20 mEq total) by mouth 2 (two) times daily.  60 tablet  0  . pravastatin (PRAVACHOL) 40 MG tablet Take 1 tablet (40 mg total) by mouth daily.  30 tablet  12    Allergies: Allergies  Allergen Reactions  . Sulfonamide Derivatives     History  Substance Use Topics  . Smoking status: Never Smoker   . Smokeless tobacco: Not on file  . Alcohol Use: No     ROS:  Please see the history of present illness.   No fever.  She notes nausea and diarrhea.    All other systems reviewed and negative.   PHYSICAL EXAM: VS:  BP 120/70  Pulse 98  Ht 5' (1.524 m)  Wt 195 lb 12.8 oz (88.814 kg)  BMI 38.24 kg/m2 Well nourished, well developed, in no acute distress HEENT: normal Neck: no JVD Cardiac:  normal S1, S2; RRR; no murmur Chest: median sternotomy wound well healed without erythema or d/c;  CT wounds in upper abdomen with deep base with good granulation tissue - no erythema or drainage Lungs:  clear to auscultation bilaterally, no wheezing, rhonchi or rales Abd: soft, nontender, no hepatomegaly Ext: no edema; right groin without hematoma or bruit  Skin: warm and dry Neuro:  CNs 2-12 intact, no focal abnormalities noted Psych: appears somewhat anxious at times  EKG:  NSR, HR 98, normal axis, inf Q waves, no change from prior tracing  ASSESSMENT AND PLAN:  1.  CAD Overall, doing well post CABG.  She continues to have chest pains.  Her ECG is unchanged.  She had these right after her CABG as well and they have not improved.  I suspect she has a combination of chest pain from her median sternotomy as well as GERD and possibly anxiety over her medical problems.  Her symptoms seem to coincide with eating spicy foods.  She is concerned because her symptoms of angina also seemed to be assoc with indigestion.  At this point, I do not think there is a reason to proceed with stress testing or relook cath.  I have suggested she see her  GI when she can (she notes inability at this time due to no insurance).  Add Pepcid 20 mg QHS.  Decrease ASA  to 81 mg QD.  HR is high.  Increase coreg to 6.25 mg bid.  She can cut back on Lasix and K+ to take once daily as needed for weight gain or edema.  She should proceed with cardiac rehab.  Reassurance provided today.  I spent > 20 minutes with her and her husband today discussing her symptoms.  Follow up with Dr. Eden Emms in 1 month.    2.  S/p CABG Chest tube wounds slow to heal.  No signs of infection.  She should keep these clean and dry and follow up with Dr. Dorris Fetch as scheduled.    3.  HTN Controlled  4.  GERD Add Pepcid 20 mg QHS.  Follow up with Dr. Kinnie Scales as soon as she can.  5.  Hyperlipidemia Lab Results  Component Value Date   CHOL 175 02/03/2012   HDL 40 02/03/2012   LDLCALC 92 02/03/2012   LDLDIRECT 168.9 08/22/2010   TRIG 214* 02/03/2012   CHOLHDL 4.4 02/03/2012    Continue pravastatin.    6.  COPD Likely impacting her breathing as well.     Signed, Tereso Newcomer, PA-C  2:23 PM 02/25/2012

## 2012-02-26 ENCOUNTER — Telehealth: Payer: Self-pay | Admitting: *Deleted

## 2012-02-26 DIAGNOSIS — I10 Essential (primary) hypertension: Secondary | ICD-10-CM

## 2012-02-26 LAB — BASIC METABOLIC PANEL
Calcium: 9.6 mg/dL (ref 8.4–10.5)
Creatinine, Ser: 1.1 mg/dL (ref 0.4–1.2)
GFR: 51.54 mL/min — ABNORMAL LOW (ref >60.00)

## 2012-02-26 NOTE — Telephone Encounter (Signed)
Message copied by Tarri Fuller on Tue Feb 26, 2012  4:33 PM ------      Message from: [place], [place] T      Created: Tue Feb 26, 2012  2:18 PM       Ok      K+ high normal      Repeat bmet in one week      Make sure she changes Lasix to prn only      No need to take K+ with Lasix         - ie stop K+ altogether      [facility], PA-C  2:18 PM 02/26/2012

## 2012-02-26 NOTE — Telephone Encounter (Signed)
Message copied by Tarri Fuller on Tue Feb 26, 2012  5:03 PM ------      Message from: [place], [place] T      Created: Tue Feb 26, 2012  2:18 PM       Ok      K+ high normal      Repeat bmet in one week      Make sure she changes Lasix to prn only      No need to take K+ with Lasix         - ie stop K+ altogether      [facility], PA-C  2:18 PM 02/26/2012

## 2012-02-26 NOTE — Telephone Encounter (Signed)
lmom lab ok, except K+ normal high range. lm for ptcb to discuss results and recommendations from [place], [facility].

## 2012-02-27 ENCOUNTER — Ambulatory Visit (INDEPENDENT_AMBULATORY_CARE_PROVIDER_SITE_OTHER): Payer: Self-pay | Admitting: Thoracic Surgery (Cardiothoracic Vascular Surgery)

## 2012-02-27 ENCOUNTER — Ambulatory Visit
Admission: RE | Admit: 2012-02-27 | Discharge: 2012-02-27 | Disposition: A | Discharge status: Home / Self Care | Payer: No Typology Code available for payment source | Source: Ambulatory Visit | Attending: Thoracic Surgery (Cardiothoracic Vascular Surgery) | Admitting: Thoracic Surgery (Cardiothoracic Vascular Surgery)

## 2012-02-27 VITALS — BP 156/88 | HR 94 | Resp 18 | Ht 60.0 in | Wt 187.0 lb

## 2012-02-27 DIAGNOSIS — Z951 Presence of aortocoronary bypass graft: Secondary | ICD-10-CM

## 2012-02-27 DIAGNOSIS — I2510 Atherosclerotic heart disease of native coronary artery without angina pectoris: Secondary | ICD-10-CM

## 2012-02-27 NOTE — Progress Notes (Signed)
HPI:  [name] presents today for a scheduled followup visit following coronary bypass grafting x5 on 02/04/12. She presented with unstable angina and at catheterization had severe 3-vessel disease. She had fair quality vein and 2/5 target vessels were only fair quality, the remaining targets and mammary artery were good quality. Her postoperative course was unremarkable and she was discharged without any major complications.  Since discharge she says she's been concerned because she's been having indigestion she can't tell if that is her heart or stomach. Her from her description it sounds more likely to be reflux than angina. She has been taking oxycodone about once or twice a day. She says that she has been having some short-term memory problems since surgery and she "it just does not feel like the same person"  Past Medical History  Diagnosis Date  . PERIPHERAL VASCULAR DISEASE 07/15/2009  . CAD (coronary artery disease)     1. s/p inf MI 2007 c/b low BP and brady, req temp pacer and pressors;  tx with DES x 2 to RCA;  2. inf STEMI 10/12 - PL occluded but small, med Rx recommended;  3.  NSTEMI 01/2012 - LHC with 3v CAD adn EF 45%; Echo with mild LVH, inf HK, EF 55-605, grade 1 diast dysfxn, mild AS;  4. s/p CABG 02/04/12: L-LAD, S-RI, S-OM1, S-PD/PLVB (Arik Husmann)  . HYPERTENSION 08/23/2007  . HYPERLIPIDEMIA 08/23/2007  . Somatization disorder 08/23/2007  . RENAL INSUFFICIENCY 03/02/2009  . PERIPHERAL EDEMA   . OSTEOARTHRITIS   . OBESITY   . MENOPAUSAL DISORDER   . LOW BACK PAIN   . IBS   . HYPOTHYROIDISM   . GLUCOSE INTOLERANCE   . GERD   . FIBROMYALGIA   . FATIGUE, CHRONIC   . DIVERTICULITIS, HX OF   . COPD   . COLONIC POLYPS, HX OF   . BREAST MASS, LEFT   . ASTHMA   . ANXIETY   . ANEMIA-IRON DEFICIENCY   . ALLERGIC RHINITIS   . Stroke     Current Outpatient Prescriptions  Medication Sig Dispense Refill  . albuterol (PROVENTIL HFA;VENTOLIN HFA) 108 (90 BASE) MCG/ACT  inhaler Inhale 2 puffs into the lungs every 6 (six) hours as needed. Shortness of breath      . aspirin 81 MG tablet Take 1 tablet (81 mg total) by mouth daily.  30 tablet    . carvedilol (COREG) 6.25 MG tablet Take 1 tablet (6.25 mg total) by mouth 2 (two) times daily with a meal.  60 tablet  3  . clopidogrel (PLAVIX) 75 MG tablet Take 1 tablet (75 mg total) by mouth daily.  30 tablet  12  . famotidine (PEPCID) 20 MG tablet Take 1 tablet (20 mg total) by mouth at bedtime.  60 tablet  3  . Fluticasone-Salmeterol (ADVAIR) 250-50 MCG/DOSE AEPB Inhale 1 puff into the lungs every 12 (twelve) hours as needed. Shortness of breath      . levothyroxine (SYNTHROID, LEVOTHROID) 125 MCG tablet Take 1 tablet (125 mcg total) by mouth daily before breakfast.  30 tablet  1  . losartan (COZAAR) 50 MG tablet Take 1 tablet (50 mg total) by mouth daily.  30 tablet  1  . metaxalone (SKELAXIN) 800 MG tablet Take 800 mg by mouth 2 (two) times daily as needed.       Marland Kitchen omeprazole (PRILOSEC) 20 MG capsule Take 20 mg by mouth 2 (two) times daily.        Marland Kitchen oxycodone (OXY-IR) 5 MG capsule  Take 5 mg by mouth every 4 (four) hours as needed.      . pravastatin (PRAVACHOL) 40 MG tablet Take 1 tablet (40 mg total) by mouth daily.  30 tablet  12  . furosemide (LASIX) 40 MG tablet Take 1 tablet (40 mg total) by mouth as needed.  30 tablet  3    Physical Exam BP 156/88  Pulse 94  Resp 18  Ht 5' (1.524 m)  Wt 187 lb (84.823 kg)  BMI 36.52 kg/m2  SpO2 97% Neuro alert and oriented x3 with no focal deficits Lungs clear with equal breath sounds bilaterally Cardiac regular rate and rhythm normal S1 and S2 Sternum stable Sternal incision with minimal skin separation and serous drainage in the midportion of the wound Chest tube sites with eschar and exudate, cleaned to granulating tissue and packed Extremities trace to 1+ edema right leg, incisions healing well   Diagnostic Tests:  Good aeration of the lungs  bilaterally  Impression: [name] is a 61 year old woman status post coronary bypass grafting x5 about 3 weeks ago. She's still having some incisional discomfort which is to be expected and I gave her prescription for additional 30 oxycodone tablets one to 2 tablets 3 times daily as needed for pain, dispensed 30 with no refills. However she can call back if she needs additional pain medication beyond that if she needs it.  Regarding her "indigestion", this really sound more like indigestion than angina to me but she is very anxious about it. As I recall she had similar complaints while in the hospital immediately after surgery. She also very anxious about having incisional pain as well as her memory issues, I think her anxiety is playing a major role in all of her symptoms. Overall I think she's doing well she's just at the 3 week point in time when people feel the worst, and I expect she will start feeling much better in the next 2-3 weeks.  Plan: Return in 3 weeks to check on her progress.

## 2012-02-27 NOTE — Telephone Encounter (Signed)
Pt's husband notified of lab results and repeat bmet 03/07/12 and to stop K+ and to take lasix as prn for now

## 2012-03-04 ENCOUNTER — Other Ambulatory Visit: Payer: Self-pay

## 2012-03-04 MED ORDER — FUROSEMIDE 40 MG PO TABS: 40.0000 mg | ORAL_TABLET | ORAL | Status: DC | PRN

## 2012-03-04 MED ORDER — POTASSIUM CHLORIDE CRYS ER 20 MEQ PO TBCR: 20.0000 meq | EXTENDED_RELEASE_TABLET | ORAL | Status: DC | PRN

## 2012-03-07 ENCOUNTER — Other Ambulatory Visit: Payer: Self-pay

## 2012-03-14 ENCOUNTER — Other Ambulatory Visit: Payer: Self-pay

## 2012-03-20 ENCOUNTER — Other Ambulatory Visit: Payer: Self-pay | Admitting: Thoracic Surgery (Cardiothoracic Vascular Surgery)

## 2012-03-20 DIAGNOSIS — I2510 Atherosclerotic heart disease of native coronary artery without angina pectoris: Secondary | ICD-10-CM

## 2012-03-21 ENCOUNTER — Ambulatory Visit: Payer: Self-pay | Admitting: Cardiovascular Disease

## 2012-03-25 ENCOUNTER — Ambulatory Visit (INDEPENDENT_AMBULATORY_CARE_PROVIDER_SITE_OTHER): Payer: Self-pay | Admitting: Thoracic Surgery (Cardiothoracic Vascular Surgery)

## 2012-03-25 ENCOUNTER — Encounter: Payer: Self-pay | Admitting: Thoracic Surgery (Cardiothoracic Vascular Surgery)

## 2012-03-25 VITALS — BP 136/88 | HR 98 | Resp 20 | Ht 60.0 in | Wt 200.0 lb

## 2012-03-25 DIAGNOSIS — I2510 Atherosclerotic heart disease of native coronary artery without angina pectoris: Secondary | ICD-10-CM

## 2012-03-25 DIAGNOSIS — Z951 Presence of aortocoronary bypass graft: Secondary | ICD-10-CM

## 2012-03-25 NOTE — Progress Notes (Signed)
HPI:  [name] returns today for a scheduled followup. She had coronary bypass grafting x5 on April 1. She was relation the hospital on postoperative day #6. She was seen in the office on April 24 which time she was still having a lot of pain and complained of the short-term memory loss. She now returns for her second followup visit. She continues to have some pain and has complaints of occasionally feel a clicking or popping and feels like "there is an accordion in her chest" when she lies on her side. She also complains of cramping in her right leg and foot at night and pain in her right foot when she walks. She had stopped her Lasix and potassium and has noted some swelling in her hands and feet since stopping that medication. She'll I questioned regarding when she would no longer have pain and when she would be back to "normal".  Past Medical History  Diagnosis Date  . PERIPHERAL VASCULAR DISEASE 07/15/2009  . CAD (coronary artery disease)     1. s/p inf MI 2007 c/b low BP and brady, req temp pacer and pressors;  tx with DES x 2 to RCA;  2. inf STEMI 10/12 - PL occluded but small, med Rx recommended;  3.  NSTEMI 01/2012 - LHC with 3v CAD adn EF 45%; Echo with mild LVH, inf HK, EF 55-605, grade 1 diast dysfxn, mild AS;  4. s/p CABG 02/04/12: L-LAD, S-RI, S-OM1, S-PD/PLVB ([facility]ersen Carroll)  . HYPERTENSION 08/23/2007  . HYPERLIPIDEMIA 08/23/2007  . Somatization disorder 08/23/2007  . RENAL INSUFFICIENCY 03/02/2009  . PERIPHERAL EDEMA   . OSTEOARTHRITIS   . OBESITY   . MENOPAUSAL DISORDER   . LOW BACK PAIN   . IBS   . HYPOTHYROIDISM   . GLUCOSE INTOLERANCE   . GERD   . FIBROMYALGIA   . FATIGUE, CHRONIC   . DIVERTICULITIS, HX OF   . COPD   . COLONIC POLYPS, HX OF   . BREAST MASS, LEFT   . ASTHMA   . ANXIETY   . ANEMIA-IRON DEFICIENCY   . ALLERGIC RHINITIS   . Stroke   ]    Current Outpatient Prescriptions  Medication Sig Dispense Refill  . albuterol (PROVENTIL HFA;VENTOLIN HFA)  108 (90 BASE) MCG/ACT inhaler Inhale 2 puffs into the lungs every 6 (six) hours as needed. Shortness of breath      . aspirin 81 MG tablet Take 81 mg by mouth 2 (two) times daily.      . carvedilol (COREG) 6.25 MG tablet Take 1 tablet (6.25 mg total) by mouth 2 (two) times daily with a meal.  60 tablet  3  . clopidogrel (PLAVIX) 75 MG tablet Take 1 tablet (75 mg total) by mouth daily.  30 tablet  12  . famotidine (PEPCID) 20 MG tablet Take 1 tablet (20 mg total) by mouth at bedtime.  60 tablet  3  . Fluticasone-Salmeterol (ADVAIR) 250-50 MCG/DOSE AEPB Inhale 1 puff into the lungs every 12 (twelve) hours as needed. Shortness of breath      . furosemide (LASIX) 40 MG tablet Take 1 tablet (40 mg total) by mouth as needed (Only when needed for swelling or 3lbs weight gain for a day.).  30 tablet  3  . levothyroxine (SYNTHROID, LEVOTHROID) 125 MCG tablet Take 1 tablet (125 mcg total) by mouth daily before breakfast.  30 tablet  1  . losartan (COZAAR) 50 MG tablet Take 1 tablet (50 mg total) by mouth daily.  30  tablet  1  . metaxalone (SKELAXIN) 800 MG tablet Take 800 mg by mouth 2 (two) times daily as needed.       Marland Kitchen omeprazole (PRILOSEC) 20 MG capsule Take 20 mg by mouth 2 (two) times daily.        Marland Kitchen oxycodone (OXY-IR) 5 MG capsule Take 5 mg by mouth every 4 (four) hours as needed.      . pravastatin (PRAVACHOL) 40 MG tablet Take 1 tablet (40 mg total) by mouth daily.  30 tablet  12    Physical Exam BP 136/88  Pulse 98  Resp 20  Ht 5' (1.524 m)  Wt 200 lb (90.719 kg)  BMI 39.06 kg/m2  SpO2 80% Obese 61 year old woman in no acute distress General affect more normal at this visit the last. Lungs clear Cardiac regular rate and rhythm Sternum is stable no clicking or popping, incision well-healed  Diagnostic Tests: None  Impression: 61 year old woman with fibromyalgia now about 6 weeks out from coronary bypass grafting. She continues to be concerned about having pain requesting more pain  medication. However the oxycodone causes nausea. I gave her a prescription for Ultram 50 mg tablets, one to 2 tablets every 6 hours as needed for pain, dispense 40, no refills. She may call if she needs additional pain medication after that. I cautioned her that given that she has fibromyalgia she may have more prolonged pain postoperatively than the average patient. Regards her short-term memory loss seems to have improved significantly since her last visit and her husband states that she had some short-term memory issues even prior surgery. I do think that should continue to improve that she gets further out from surgery.  Regarding her complaints about the swelling I recommended that she try taking Lasix every other day or if she suggested she could try taking half a tablet daily.  Plan: She will continue to be followed by Dr. [name] for her cardiology issues. I will be happy to see her back any time if I can be of any assistance with her care.

## 2012-05-22 ENCOUNTER — Other Ambulatory Visit: Payer: Self-pay | Admitting: Internal Medicine

## 2012-05-23 ENCOUNTER — Other Ambulatory Visit: Payer: Self-pay | Admitting: Cardiovascular Disease

## 2012-05-28 ENCOUNTER — Telehealth: Payer: Self-pay | Admitting: *Deleted

## 2012-05-28 NOTE — Telephone Encounter (Signed)
Pt husband wants levothyroxine filled, notified him that we normally dont fill this medication due to it being non-cardiac. Pt wanted to know if Dr. Eden Emms would fill it. Please call pt and advise.

## 2012-06-10 NOTE — Telephone Encounter (Signed)
LEFT A COUPLE OF MESSAGES  FOR PT TO CALL BACK RE  MED REFILL  WITH NO RESPONSE  AWAITING RETURN CALL./CY

## 2012-06-11 ENCOUNTER — Telehealth: Payer: Self-pay | Admitting: Cardiovascular Disease

## 2012-06-11 NOTE — Telephone Encounter (Signed)
Pt spouse rtn your call please call the number listed above because the other number has bad reception

## 2012-06-11 NOTE — Telephone Encounter (Signed)
PT'S HUSBAND AWARE  THAT WE WILL NOT FILL SYNTHROID  PER HUSBAND ANOTHER MD FILLED MED  .Zack Seal

## 2012-09-24 ENCOUNTER — Other Ambulatory Visit: Payer: Self-pay | Admitting: *Deleted

## 2012-09-24 MED ORDER — PRAVASTATIN SODIUM 40 MG PO TABS: 40.0000 mg | ORAL_TABLET | Freq: Every day | ORAL | Status: DC

## 2014-01-25 ENCOUNTER — Other Ambulatory Visit: Payer: Self-pay | Admitting: Cardiovascular Disease

## 2014-05-03 ENCOUNTER — Other Ambulatory Visit: Payer: Self-pay

## 2014-05-03 MED ORDER — FUROSEMIDE 40 MG PO TABS: 40.0000 mg | ORAL_TABLET | ORAL | Status: DC | PRN

## 2014-10-14 ENCOUNTER — Encounter (HOSPITAL_COMMUNITY): Payer: Self-pay | Admitting: Cardiovascular Disease

## 2016-09-11 ENCOUNTER — Inpatient Hospital Stay (HOSPITAL_COMMUNITY)
Admission: EM | Admit: 2016-09-11 | Discharge: 2016-09-14 | DRG: 281 | Disposition: A | Discharge status: Home / Self Care | Payer: Medicare Other | Attending: Cardiovascular Disease | Admitting: Cardiovascular Disease

## 2016-09-11 ENCOUNTER — Inpatient Hospital Stay (HOSPITAL_COMMUNITY): Payer: Medicare Other

## 2016-09-11 ENCOUNTER — Encounter (HOSPITAL_COMMUNITY)
Admission: EM | Disposition: A | Discharge status: Home / Self Care | Payer: Self-pay | Source: Home / Self Care | Attending: Cardiovascular Disease

## 2016-09-11 ENCOUNTER — Encounter (HOSPITAL_COMMUNITY): Payer: Self-pay | Admitting: Emergency Medicine

## 2016-09-11 DIAGNOSIS — E669 Obesity, unspecified: Secondary | ICD-10-CM | POA: Diagnosis present

## 2016-09-11 DIAGNOSIS — Z7982 Long term (current) use of aspirin: Secondary | ICD-10-CM | POA: Diagnosis not present

## 2016-09-11 DIAGNOSIS — I252 Old myocardial infarction: Secondary | ICD-10-CM | POA: Diagnosis not present

## 2016-09-11 DIAGNOSIS — Z7902 Long term (current) use of antithrombotics/antiplatelets: Secondary | ICD-10-CM

## 2016-09-11 DIAGNOSIS — Z6839 Body mass index (BMI) 39.0-39.9, adult: Secondary | ICD-10-CM

## 2016-09-11 DIAGNOSIS — I200 Unstable angina: Secondary | ICD-10-CM

## 2016-09-11 DIAGNOSIS — Z9889 Other specified postprocedural states: Secondary | ICD-10-CM

## 2016-09-11 DIAGNOSIS — Y711 Therapeutic (nonsurgical) and rehabilitative cardiovascular devices associated with adverse incidents: Secondary | ICD-10-CM | POA: Diagnosis present

## 2016-09-11 DIAGNOSIS — I25710 Atherosclerosis of autologous vein coronary artery bypass graft(s) with unstable angina pectoris: Secondary | ICD-10-CM | POA: Diagnosis present

## 2016-09-11 DIAGNOSIS — I25110 Atherosclerotic heart disease of native coronary artery with unstable angina pectoris: Secondary | ICD-10-CM | POA: Diagnosis present

## 2016-09-11 DIAGNOSIS — E785 Hyperlipidemia, unspecified: Secondary | ICD-10-CM | POA: Diagnosis present

## 2016-09-11 DIAGNOSIS — Z955 Presence of coronary angioplasty implant and graft: Secondary | ICD-10-CM

## 2016-09-11 DIAGNOSIS — Z882 Allergy status to sulfonamides status: Secondary | ICD-10-CM | POA: Diagnosis not present

## 2016-09-11 DIAGNOSIS — T82867A Thrombosis of cardiac prosthetic devices, implants and grafts, initial encounter: Secondary | ICD-10-CM | POA: Diagnosis present

## 2016-09-11 DIAGNOSIS — Y832 Surgical operation with anastomosis, bypass or graft as the cause of abnormal reaction of the patient, or of later complication, without mention of misadventure at the time of the procedure: Secondary | ICD-10-CM | POA: Diagnosis present

## 2016-09-11 DIAGNOSIS — I350 Nonrheumatic aortic (valve) stenosis: Secondary | ICD-10-CM | POA: Diagnosis present

## 2016-09-11 DIAGNOSIS — Z8249 Family history of ischemic heart disease and other diseases of the circulatory system: Secondary | ICD-10-CM | POA: Diagnosis not present

## 2016-09-11 DIAGNOSIS — Z8673 Personal history of transient ischemic attack (TIA), and cerebral infarction without residual deficits: Secondary | ICD-10-CM | POA: Diagnosis not present

## 2016-09-11 DIAGNOSIS — I10 Essential (primary) hypertension: Secondary | ICD-10-CM | POA: Diagnosis present

## 2016-09-11 DIAGNOSIS — I214 Non-ST elevation (NSTEMI) myocardial infarction: Secondary | ICD-10-CM

## 2016-09-11 DIAGNOSIS — E039 Hypothyroidism, unspecified: Secondary | ICD-10-CM | POA: Diagnosis present

## 2016-09-11 DIAGNOSIS — T82857A Stenosis of cardiac prosthetic devices, implants and grafts, initial encounter: Secondary | ICD-10-CM | POA: Diagnosis present

## 2016-09-11 DIAGNOSIS — I25810 Atherosclerosis of coronary artery bypass graft(s) without angina pectoris: Secondary | ICD-10-CM

## 2016-09-11 DIAGNOSIS — Z9861 Coronary angioplasty status: Secondary | ICD-10-CM

## 2016-09-11 DIAGNOSIS — I2510 Atherosclerotic heart disease of native coronary artery without angina pectoris: Secondary | ICD-10-CM | POA: Diagnosis present

## 2016-09-11 DIAGNOSIS — K219 Gastro-esophageal reflux disease without esophagitis: Secondary | ICD-10-CM | POA: Diagnosis present

## 2016-09-11 DIAGNOSIS — R079 Chest pain, unspecified: Secondary | ICD-10-CM | POA: Diagnosis not present

## 2016-09-11 HISTORY — PX: CARDIAC CATHETERIZATION: SHX172

## 2016-09-11 LAB — BRAIN NATRIURETIC PEPTIDE: B Natriuretic Peptide: 161.2 pg/mL — ABNORMAL HIGH (ref 0.0–100.0)

## 2016-09-11 LAB — BASIC METABOLIC PANEL
ANION GAP: 12 (ref 5–15)
BUN: 7 mg/dL (ref 6–20)
CALCIUM: 10.1 mg/dL (ref 8.9–10.3)
CO2: 23 mmol/L (ref 22–32)
CREATININE: 0.81 mg/dL (ref 0.44–1.00)
Chloride: 103 mmol/L (ref 101–111)
GFR calc Af Amer: >60 mL/min (ref >60)
GLUCOSE: 139 mg/dL — AB (ref 65–99)
Potassium: 4.2 mmol/L (ref 3.5–5.1)
Sodium: 138 mmol/L (ref 135–145)

## 2016-09-11 LAB — CBC
HCT: 44.2 % (ref 36.0–46.0)
HCT: 40 % (ref 36.0–46.0)
HEMOGLOBIN: 14.4 g/dL (ref 12.0–15.0)
HEMOGLOBIN: 12.8 g/dL (ref 12.0–15.0)
MCH: 29.1 pg (ref 26.0–34.0)
MCH: 28.6 pg (ref 26.0–34.0)
MCHC: 32.6 g/dL (ref 30.0–36.0)
MCHC: 32 g/dL (ref 30.0–36.0)
MCV: 89.3 fL (ref 78.0–100.0)
MCV: 89.5 fL (ref 78.0–100.0)
PLATELETS: 345 10*3/uL (ref 150–400)
PLATELETS: 282 10*3/uL (ref 150–400)
RBC: 4.95 MIL/uL (ref 3.87–5.11)
RBC: 4.47 MIL/uL (ref 3.87–5.11)
RDW: 14.1 % (ref 11.5–15.5)
RDW: 14.1 % (ref 11.5–15.5)
WBC: 10.5 10*3/uL (ref 4.0–10.5)
WBC: 10.2 10*3/uL (ref 4.0–10.5)

## 2016-09-11 LAB — CREATININE, SERUM: CREATININE: 0.79 mg/dL (ref 0.44–1.00)

## 2016-09-11 LAB — MRSA PCR SCREENING: MRSA by PCR: NEGATIVE

## 2016-09-11 LAB — I-STAT TROPONIN, ED: TROPONIN I, POC: 1.24 ng/mL — AB (ref 0.00–0.08)

## 2016-09-11 LAB — TROPONIN I: Troponin I: 1.91 ng/mL (ref <0.03)

## 2016-09-11 LAB — POCT ACTIVATED CLOTTING TIME
ACTIVATED CLOTTING TIME: 257 s
Activated Clotting Time: 219 seconds

## 2016-09-11 SURGERY — LEFT HEART CATH AND CORONARY ANGIOGRAPHY
Anesthesia: LOCAL

## 2016-09-11 MED ORDER — VERAPAMIL HCL 2.5 MG/ML IV SOLN
INTRAVENOUS | Status: DC | PRN
  Administered 2016-09-11: 10 mL via INTRA_ARTERIAL

## 2016-09-11 MED ORDER — MIDAZOLAM HCL 2 MG/2ML IJ SOLN
INTRAMUSCULAR | Status: AC
  Filled 2016-09-11: qty 2

## 2016-09-11 MED ORDER — ASPIRIN EC 81 MG PO TBEC
81.0000 mg | DELAYED_RELEASE_TABLET | Freq: Two times a day (BID) | ORAL | Status: DC
  Administered 2016-09-11 – 2016-09-14 (×6): 81 mg via ORAL
  Filled 2016-09-11 (×6): qty 1

## 2016-09-11 MED ORDER — LIDOCAINE HCL (PF) 1 % IJ SOLN
INTRAMUSCULAR | Status: DC | PRN
  Administered 2016-09-11: 2 mL via INTRADERMAL

## 2016-09-11 MED ORDER — HEPARIN SODIUM (PORCINE) 1000 UNIT/ML IJ SOLN
INTRAMUSCULAR | Status: DC | PRN
  Administered 2016-09-11: 5000 [IU] via INTRAVENOUS
  Administered 2016-09-11: 4000 [IU] via INTRAVENOUS

## 2016-09-11 MED ORDER — NITROGLYCERIN 0.4 MG SL SUBL
0.4000 mg | SUBLINGUAL_TABLET | SUBLINGUAL | Status: DC | PRN
  Administered 2016-09-13 (×3): 0.4 mg via SUBLINGUAL
  Filled 2016-09-11: qty 1

## 2016-09-11 MED ORDER — PRAVASTATIN SODIUM 40 MG PO TABS
40.0000 mg | ORAL_TABLET | Freq: Every day | ORAL | Status: DC
  Administered 2016-09-12: 40 mg via ORAL
  Filled 2016-09-11: qty 1

## 2016-09-11 MED ORDER — HEPARIN (PORCINE) IN NACL 2-0.9 UNIT/ML-% IJ SOLN
INTRAMUSCULAR | Status: DC | PRN
  Administered 2016-09-11: 1000 mL

## 2016-09-11 MED ORDER — IOPAMIDOL (ISOVUE-370) INJECTION 76%
INTRAVENOUS | Status: AC
  Filled 2016-09-11: qty 125

## 2016-09-11 MED ORDER — AMLODIPINE BESYLATE 5 MG PO TABS
5.0000 mg | ORAL_TABLET | Freq: Every day | ORAL | Status: DC
  Administered 2016-09-12 – 2016-09-14 (×3): 5 mg via ORAL
  Filled 2016-09-11 (×3): qty 1

## 2016-09-11 MED ORDER — HEPARIN SODIUM (PORCINE) 5000 UNIT/ML IJ SOLN
5000.0000 [IU] | Freq: Three times a day (TID) | INTRAMUSCULAR | Status: DC
  Administered 2016-09-11 – 2016-09-14 (×8): 5000 [IU] via SUBCUTANEOUS
  Filled 2016-09-11 (×9): qty 1

## 2016-09-11 MED ORDER — SODIUM CHLORIDE 0.9 % IV SOLN: INTRAVENOUS | Status: AC

## 2016-09-11 MED ORDER — HEPARIN BOLUS VIA INFUSION
4000.0000 [IU] | Freq: Once | INTRAVENOUS | Status: AC
  Administered 2016-09-11: 4000 [IU] via INTRAVENOUS
  Filled 2016-09-11: qty 4000

## 2016-09-11 MED ORDER — NITROGLYCERIN 0.4 MG SL SUBL: 0.4000 mg | SUBLINGUAL_TABLET | SUBLINGUAL | Status: DC | PRN

## 2016-09-11 MED ORDER — FENTANYL CITRATE (PF) 100 MCG/2ML IJ SOLN
INTRAMUSCULAR | Status: AC
  Filled 2016-09-11: qty 2

## 2016-09-11 MED ORDER — MIDAZOLAM HCL 2 MG/2ML IJ SOLN
INTRAMUSCULAR | Status: DC | PRN
  Administered 2016-09-11 (×2): 1 mg via INTRAVENOUS
  Administered 2016-09-11: 2 mg via INTRAVENOUS

## 2016-09-11 MED ORDER — ONDANSETRON HCL 4 MG/2ML IJ SOLN
4.0000 mg | Freq: Four times a day (QID) | INTRAMUSCULAR | Status: DC | PRN
  Administered 2016-09-12 – 2016-09-14 (×7): 4 mg via INTRAVENOUS
  Filled 2016-09-11 (×9): qty 2

## 2016-09-11 MED ORDER — CARVEDILOL 6.25 MG PO TABS
6.2500 mg | ORAL_TABLET | Freq: Two times a day (BID) | ORAL | Status: DC
  Administered 2016-09-12 – 2016-09-14 (×5): 6.25 mg via ORAL
  Filled 2016-09-11 (×5): qty 1

## 2016-09-11 MED ORDER — MORPHINE SULFATE (PF) 4 MG/ML IV SOLN
4.0000 mg | Freq: Once | INTRAVENOUS | Status: AC
  Administered 2016-09-11: 4 mg via INTRAVENOUS
  Filled 2016-09-11: qty 1

## 2016-09-11 MED ORDER — LIDOCAINE HCL (PF) 1 % IJ SOLN
INTRAMUSCULAR | Status: AC
  Filled 2016-09-11: qty 30

## 2016-09-11 MED ORDER — SODIUM CHLORIDE 0.9 % IV BOLUS (SEPSIS)
1000.0000 mL | Freq: Once | INTRAVENOUS | Status: AC
  Administered 2016-09-11: 1000 mL via INTRAVENOUS

## 2016-09-11 MED ORDER — SODIUM CHLORIDE 0.9% FLUSH: 3.0000 mL | INTRAVENOUS | Status: DC | PRN

## 2016-09-11 MED ORDER — HEPARIN (PORCINE) IN NACL 2-0.9 UNIT/ML-% IJ SOLN
INTRAMUSCULAR | Status: AC
  Filled 2016-09-11: qty 500

## 2016-09-11 MED ORDER — PANTOPRAZOLE SODIUM 40 MG PO TBEC
40.0000 mg | DELAYED_RELEASE_TABLET | Freq: Every day | ORAL | Status: DC
Start: 2016-09-12 — End: 2016-09-14
  Administered 2016-09-12 – 2016-09-14 (×3): 40 mg via ORAL
  Filled 2016-09-11 (×2): qty 1

## 2016-09-11 MED ORDER — HEPARIN (PORCINE) IN NACL 100-0.45 UNIT/ML-% IJ SOLN
800.0000 [IU]/h | INTRAMUSCULAR | Status: DC
  Administered 2016-09-11: 800 [IU]/h via INTRAVENOUS
  Filled 2016-09-11: qty 250

## 2016-09-11 MED ORDER — ONDANSETRON HCL 4 MG/2ML IJ SOLN
4.0000 mg | Freq: Four times a day (QID) | INTRAMUSCULAR | Status: DC | PRN
  Administered 2016-09-11 – 2016-09-12 (×3): 4 mg via INTRAVENOUS
  Filled 2016-09-11: qty 2

## 2016-09-11 MED ORDER — OXYCODONE HCL 5 MG PO TABS
5.0000 mg | ORAL_TABLET | ORAL | Status: DC | PRN
  Administered 2016-09-11 – 2016-09-14 (×9): 5 mg via ORAL
  Filled 2016-09-11 (×9): qty 1

## 2016-09-11 MED ORDER — FENTANYL CITRATE (PF) 100 MCG/2ML IJ SOLN
INTRAMUSCULAR | Status: DC | PRN
  Administered 2016-09-11 (×3): 25 ug via INTRAVENOUS

## 2016-09-11 MED ORDER — CLOPIDOGREL BISULFATE 75 MG PO TABS
75.0000 mg | ORAL_TABLET | Freq: Every day | ORAL | Status: DC
  Administered 2016-09-12 – 2016-09-14 (×3): 75 mg via ORAL
  Filled 2016-09-11 (×3): qty 1

## 2016-09-11 MED ORDER — IOPAMIDOL (ISOVUE-370) INJECTION 76%
INTRAVENOUS | Status: DC | PRN
  Administered 2016-09-11: 100 mL via INTRAVENOUS

## 2016-09-11 MED ORDER — IOPAMIDOL (ISOVUE-370) INJECTION 76%
INTRAVENOUS | Status: AC
  Filled 2016-09-11: qty 50

## 2016-09-11 MED ORDER — LOSARTAN POTASSIUM 50 MG PO TABS
50.0000 mg | ORAL_TABLET | Freq: Every day | ORAL | Status: DC
  Administered 2016-09-12 – 2016-09-14 (×3): 50 mg via ORAL
  Filled 2016-09-11 (×3): qty 1

## 2016-09-11 MED ORDER — SODIUM CHLORIDE 0.9 % IV SOLN: 250.0000 mL | INTRAVENOUS | Status: DC | PRN

## 2016-09-11 MED ORDER — ACETAMINOPHEN 325 MG PO TABS
650.0000 mg | ORAL_TABLET | ORAL | Status: DC | PRN
  Administered 2016-09-12 – 2016-09-14 (×5): 650 mg via ORAL
  Filled 2016-09-11 (×5): qty 2

## 2016-09-11 MED ORDER — VERAPAMIL HCL 2.5 MG/ML IV SOLN
INTRAVENOUS | Status: AC
  Filled 2016-09-11: qty 2

## 2016-09-11 MED ORDER — SODIUM CHLORIDE 0.9% FLUSH
3.0000 mL | Freq: Two times a day (BID) | INTRAVENOUS | Status: DC
  Administered 2016-09-11 – 2016-09-14 (×6): 3 mL via INTRAVENOUS

## 2016-09-11 MED ORDER — LEVOTHYROXINE SODIUM 25 MCG PO TABS
125.0000 ug | ORAL_TABLET | Freq: Every day | ORAL | Status: DC
  Administered 2016-09-12 – 2016-09-14 (×3): 125 ug via ORAL
  Filled 2016-09-11 (×3): qty 1

## 2016-09-11 SURGICAL SUPPLY — 18 items
BALLN MOZEC 2.25X30 (BALLOONS) ×2
BALLOON MOZEC 2.25X30 (BALLOONS) IMPLANT
CATH EXPO 5F MPA-1 (CATHETERS) ×1 IMPLANT
CATH EXTRAC PRONTO 5.5F 138CM (CATHETERS) ×1 IMPLANT
CATH INFINITI 5FR MULTPACK ANG (CATHETERS) ×1 IMPLANT
CATH VISTA GUIDE 6FR MPA1 (CATHETERS) ×1 IMPLANT
DEVICE RAD COMP TR BAND LRG (VASCULAR PRODUCTS) ×1 IMPLANT
GLIDESHEATH SLEND SS 6F .021 (SHEATH) ×1 IMPLANT
KIT ENCORE 26 ADVANTAGE (KITS) ×1 IMPLANT
KIT HEART LEFT (KITS) ×2 IMPLANT
PACK CARDIAC CATHETERIZATION (CUSTOM PROCEDURE TRAY) ×2 IMPLANT
SHIELD RADPAD SCOOP 12X17 (MISCELLANEOUS) ×1 IMPLANT
SYR MEDRAD MARK V 150ML (SYRINGE) ×2 IMPLANT
TRANSDUCER W/STOPCOCK (MISCELLANEOUS) ×2 IMPLANT
TUBING CIL FLEX 10 FLL-RA (TUBING) ×2 IMPLANT
WIRE COUGAR XT STRL 190CM (WIRE) ×1 IMPLANT
WIRE EMERALD 3MM-J .035X260CM (WIRE) ×1 IMPLANT
WIRE HI TORQ VERSACORE-J 145CM (WIRE) ×1 IMPLANT

## 2016-09-11 NOTE — ED Triage Notes (Signed)
Pt took 4 asa, and 3 nitro today, helped with the pain a little bit, but the pain came back.

## 2016-09-11 NOTE — ED Triage Notes (Signed)
Pt states shes had CP x2 days, central chest pain that travels down both arms. Pt states "ive had six heart attacks and open heart surgery and a quadruple bypass". Pt describes the pain as pressure, pain 5/10.

## 2016-09-11 NOTE — Interval H&P Note (Signed)
Cath Lab Visit (complete for each Cath Lab visit)  Clinical Evaluation Leading to the Procedure:   ACS: Yes.    Non-ACS:    Anginal Classification: CCS IV  Anti-ischemic medical therapy: Maximal Therapy (2 or more classes of medications)  Non-Invasive Test Results: No non-invasive testing performed  Prior CABG: Previous CABG      History and Physical Interval Note:  09/11/2016 4:34 PM  [name]  has presented today for surgery, with the diagnosis of stemi  The various methods of treatment have been discussed with the patient and family. After consideration of risks, benefits and other options for treatment, the patient has consented to  Procedure(s): Left Heart Cath and Coronary Angiography (N/A) as a surgical intervention .  The patient's history has been reviewed, patient examined, no change in status, stable for surgery.  I have reviewed the patient's chart and labs.  Questions were answered to the patient's satisfaction.     Tonny Bollman

## 2016-09-11 NOTE — Progress Notes (Signed)
ANTICOAGULATION CONSULT NOTE - Initial Consult  Pharmacy Consult for heparin Indication: chest pain/ACS  Allergies  Allergen Reactions  . Sulfonamide Derivatives Rash    Patient Measurements: Height: 4' 11.84" (152 cm) Weight: 199 lb 15.3 oz (90.7 kg) IBW/kg (Calculated) : 45.14 Heparin Dosing Weight: 66 kg  Vital Signs: Temp: 98.2 F (36.8 C) (11/07 1445) Temp Source: Oral (11/07 1445) BP: 191/107 (11/07 1445) Pulse Rate: 87 (11/07 1445)  Labs:  Recent Labs  09/11/16 1449  HGB 14.4  HCT 44.2  PLT 345    CrCl cannot be calculated (Patient's most recent lab result is older than the maximum 21 days allowed.).  Assessment: 65 yo f presenting with CP x 2 days  PMH: CAD, Hx of open heart, HTN, HLD, GERD, COPD  Anticoag: none pta - iv hep for ACS   CV: trop elevated at 1.24  Nephro: bmp pending   Heme/Onc: cbc pending  Goal of Therapy:  Heparin level 0.3-0.7 units/ml Monitor platelets by anticoagulation protocol: Yes   Plan:  Heparin bolus 4000 units x 1  Heparin infusion 800 units/hr HL 2200 Daily HL, CBC F/U Cards plans  Isaac Bliss, PharmD, BCPS, [facility] Clinical Pharmacist Pager [phone] 09/11/2016 3:35 PM

## 2016-09-11 NOTE — Progress Notes (Signed)
CRITICAL VALUE ALERT  Critical value received:  Troponin 1.91  Date of notification:  09/11/2016  Time of notification:  2105  Critical value read back:Yes.    Nurse who received alert:  Owens Loffler, RN  MD notified (1st page):  Sherren Kerns (Cards Fellow)  Time of first page:  2115  MD notified (2nd page):   Time of second page:  Responding MD:  Sherren Kerns (Cards Fellow)  Time MD responded:  2115  *no intervention

## 2016-09-11 NOTE — H&P (Signed)
History and Physical  Patient ID: [name] MRN: [id number], SOB: 06/19/1951 65 y.o. Date of Encounter: 09/11/2016, 4:08 PM  Primary Physician: Oliver BarreJames John, MD Primary Cardiologist: Dr Eden EmmsNishan  Chief Complaint: Chest pain  HPI: 65 y.o. female w/ PMHx significant for CAD and previous CABG who [facility]  on 09/11/2016 with complaints of chest pain.  The patient complains of pain in her left chest radiating into the center of the chest described as a pressure-like sensation. Symptoms of been intermittent for 2 days. Symptoms worsened last night and became quite severe. She also decided this morning to come to the emergency room. She was concerned about the cost that she will incur with hospitalization and medical care.  At the time my interview, the patient's husband is at the bedside. She continues to complain of chest discomfort rated at 5/10. There is associated shortness of breath, fatigue, radiation of pain into the neck, jaw, and both arms. She denies diaphoresis, orthopnea, or PND. She has had intermittent nausea and vomiting.   Past Medical History:  Diagnosis Date  . ALLERGIC RHINITIS   . ANEMIA-IRON DEFICIENCY   . ANXIETY   . ASTHMA   . BREAST MASS, LEFT   . CAD (coronary artery disease)    1. s/p inf MI 2007 c/b low BP and brady, req temp pacer and pressors;  tx with DES x 2 to RCA;  2. inf STEMI 10/12 - PL occluded but small, med Rx recommended;  3.  NSTEMI 01/2012 - LHC with 3v CAD adn EF 45%; Echo with mild LVH, inf HK, EF 55-605, grade 1 diast dysfxn, mild AS;  4. s/p CABG 02/04/12: L-LAD, S-RI, S-OM1, S-PD/PLVB (Hendrickson)  . COLONIC POLYPS, HX OF   . COPD   . DIVERTICULITIS, HX OF   . FATIGUE, CHRONIC   . FIBROMYALGIA   . GERD   . GLUCOSE INTOLERANCE   . HYPERLIPIDEMIA 08/23/2007  . HYPERTENSION 08/23/2007  . HYPOTHYROIDISM   . IBS   . LOW BACK PAIN   . MENOPAUSAL DISORDER   . OBESITY   . OSTEOARTHRITIS   . PERIPHERAL EDEMA   .  PERIPHERAL VASCULAR DISEASE 07/15/2009  . RENAL INSUFFICIENCY 03/02/2009  . Somatization disorder 08/23/2007  . Stroke [facility])      Surgical History:  Past Surgical History:  Procedure Laterality Date  . ABDOMINAL HYSTERECTOMY    . APPENDECTOMY    . CARDIAC CATHETERIZATION    . CHOLECYSTECTOMY    . COLON SURGERY    . CORONARY ANGIOPLASTY    . CORONARY ARTERY BYPASS GRAFT  02/04/2012   Procedure: CORONARY ARTERY BYPASS GRAFTING (CABG);  Surgeon: Loreli SlotSteven C Hendrickson, MD;  [facility];  Service: Open Heart Surgery;  Laterality: N/A;  CABG x five  using left internal mammary artery and right leg greater saphenous vein harvested endoscopically  . CORONARY STENT PLACEMENT    . LEFT HEART CATHETERIZATION WITH CORONARY ANGIOGRAM N/A 01/30/2012   Procedure: LEFT HEART CATHETERIZATION WITH CORONARY ANGIOGRAM;  Surgeon: Kathleene Hazelhristopher D McAlhany, MD;  [facility];  Service: Cardiovascular;  Laterality: N/A;  . OOPHORECTOMY    . REPLACEMENT TOTAL KNEE    . TONSILLECTOMY       Home Meds: Prior to Admission medications   Medication Sig Start Date End Date Taking? Authorizing Provider  albuterol (PROVENTIL HFA;VENTOLIN HFA) 108 (90 BASE) MCG/ACT inhaler Inhale 2 puffs into the lungs every 6 (six) hours as needed. Shortness of breath  Yes Historical Provider, MD  amLODipine (NORVASC) 5 MG tablet Take 5 mg by mouth daily.   Yes Historical Provider, MD  aspirin 325 MG EC tablet Take 650 mg by mouth daily.   Yes Historical Provider, MD  aspirin 81 MG tablet Take 81 mg by mouth 2 (two) times daily. 02/25/12  Yes Scott T Alben Spittle, PA-C  carvedilol (COREG) 25 MG tablet Take 25 mg by mouth 2 (two) times daily with a meal.   Yes Historical Provider, MD  clopidogrel (PLAVIX) 75 MG tablet Take 1 tablet (75 mg total) by mouth daily. 09/13/11  Yes Wendall Stade, MD  levothyroxine (SYNTHROID, LEVOTHROID) 125 MCG tablet Take 1 tablet (125 mcg total) by mouth daily before breakfast. 02/10/12 09/11/16 Yes Gina L  Collins, PA-C  losartan (COZAAR) 50 MG tablet Take 1 tablet (50 mg total) by mouth daily. 02/08/12  Yes Erin R Barrett, PA-C  nitroGLYCERIN (NITROSTAT) 0.4 MG SL tablet Place 0.4 mg under the tongue every 5 (five) minutes as needed for chest pain.   Yes Historical Provider, MD  omeprazole (PRILOSEC) 20 MG capsule Take 20 mg by mouth 2 (two) times daily.     Yes Historical Provider, MD  potassium chloride SA (K-DUR,KLOR-CON) 20 MEQ tablet Take 20 mEq by mouth as directed.   Yes Historical Provider, MD  pravastatin (PRAVACHOL) 40 MG tablet Take 1 tablet (40 mg total) by mouth daily. 09/24/12  Yes Wendall Stade, MD  carvedilol (COREG) 6.25 MG tablet Take 1 tablet (6.25 mg total) by mouth 2 (two) times daily with a meal. 02/25/12 02/24/13  Beatrice Lecher, PA-C  famotidine (PEPCID) 20 MG tablet Take 1 tablet (20 mg total) by mouth at bedtime. 02/25/12 02/24/13  Beatrice Lecher, PA-C  Fluticasone-Salmeterol (ADVAIR) 250-50 MCG/DOSE AEPB Inhale 1 puff into the lungs every 12 (twelve) hours as needed. Shortness of breath    Historical Provider, MD  furosemide (LASIX) 40 MG tablet Take 1 tablet (40 mg total) by mouth as needed (Only when needed for swelling or 3lbs weight gain for a day.). Patient not taking: Reported on 09/11/2016 05/03/14 05/03/15  Laurey Morale, MD  metaxalone (SKELAXIN) 800 MG tablet Take 800 mg by mouth 2 (two) times daily as needed for muscle spasms.     Historical Provider, MD  oxycodone (OXY-IR) 5 MG capsule Take 5 mg by mouth every 4 (four) hours as needed for pain.     Historical Provider, MD    Allergies:  Allergies  Allergen Reactions  . Sulfonamide Derivatives Rash    Social History   Social History  . Marital status: Married    Spouse name: N/A  . Number of children: N/A  . Years of education: N/A   Occupational History  . Not on file.   Social History Main Topics  . Smoking status: Never Smoker  . Smokeless tobacco: Not on file  . Alcohol use No  . Drug use: No  .  Sexual activity: Not on file   Other Topics Concern  . Not on file   Social History Narrative  . No narrative on file    Family history: The patient's son died of a myocardial infarction at age 14. Her father had a heart attack in his 71s  Review of Systems: General: negative for chills, fever, night sweats or weight changes.  ENT: negative for rhinorrhea or epistaxis Cardiovascular: See history of present illness Dermatological: negative for rash Respiratory: negative for cough or wheezing GI: negative for diarrhea,  bright red blood per rectum, melena, or hematemesis. Positive for nausea and vomiting GU: no hematuria, urgency, or frequency Neurologic: negative for visual changes, syncope, headache, or dizziness Heme: no easy bruising or bleeding Endo: negative for excessive thirst, thyroid disorder, or flushing Musculoskeletal: negative for joint pain or swelling, negative for myalgias All other systems reviewed and are otherwise negative except as noted above.  Physical Exam: Blood pressure 164/93, pulse 80, temperature 98.2 F (36.8 C), temperature source Oral, resp. rate 20, height 4' 11.84" (1.52 m), weight 90.7 kg (199 lb 15.3 oz), SpO2 98 %. General: Well developed, well nourished, alert and oriented, pleasant obese woman in no acute distress. HEENT: Normocephalic, atraumatic, sclera anicteric Neck: Supple. Carotids 2+ without bruits. JVP normal Lungs: Clear bilaterally to auscultation without wheezes, rales, or rhonchi. Breathing is unlabored. Heart: RRR with normal S1 and S2. 2/6 systolic murmur at the RUSB Abdomen: Soft, non-tender, non-distended with normoactive bowel sounds. No hepatomegaly. No rebound/guarding. No obvious abdominal masses. Back: No CVA tenderness Msk:  Strength and tone appear normal for age. Extremities: No clubbing, cyanosis, or edema.  Distal pedal pulses are 2+ and equal bilaterally. Neuro: CNII-XII intact, moves all extremities  spontaneously. Psych:  Responds to questions appropriately with a normal affect. Skin: warm and dry without rash   Labs:   Lab Results  Component Value Date   WBC 10.5 09/11/2016   HGB 14.4 09/11/2016   HCT 44.2 09/11/2016   MCV 89.3 09/11/2016   PLT 345 09/11/2016    Recent Labs Lab 09/11/16 1449  NA 138  K 4.2  CL 103  CO2 23  BUN 7  CREATININE 0.81  CALCIUM 10.1  GLUCOSE 139*   No results for input(s): CKTOTAL, CKMB, TROPONINI in the last 72 hours. Lab Results  Component Value Date   CHOL 175 02/03/2012   HDL 40 02/03/2012   LDLCALC 92 02/03/2012   TRIG 214 (H) 02/03/2012   Lab Results  Component Value Date   DDIMER  02/15/2009    0.37        AT THE INHOUSE ESTABLISHED CUTOFF VALUE OF 0.48 ug/mL FEU, THIS ASSAY HAS BEEN DOCUMENTED IN THE LITERATURE TO HAVE A SENSITIVITY AND NEGATIVE PREDICTIVE VALUE OF AT LEAST 98 TO 99%.  THE TEST RESULT SHOULD BE CORRELATED WITH AN ASSESSMENT OF THE CLINICAL PROBABILITY OF DVT / VTE.    Radiology/Studies:  No results found.   EKG: Normal sinus rhythm, borderline ST segment elevation, suggestive of inferoposterior injury  CARDIAC STUDIES: Pending  ASSESSMENT AND PLAN: 1. Acute non-ST elevation infarction: The patient has borderline changes for STEMI. However, even her old EKG from 2013 shows findings suggestive of an inferior infarct. I do not think her EKG is diagnostic of STEMI, but with ongoing chest discomfort and ischemic symptoms I have recommended emergency cardiac catheterization and possible PCI. I have reviewed the risks, indications, and alternatives to cardiac catheterization, possible angioplasty, and stenting with the patient. Risks include but are not limited to bleeding, infection, vascular injury, stroke, myocardial infection, arrhythmia, kidney injury, radiation-related injury in the case of prolonged fluoroscopy use, emergency cardiac surgery, and death. The patient understands the risks of serious  complication is 1-2 in 1000 with diagnostic cardiac cath and 1-2% or less with angioplasty/stenting.   The patient has been maintained on long-term dual antiplatelet therapy with aspirin and clopidogrel. We'll continue in intensive medical regimen pending the findings of her heart catheterization. May consider switching her antiplatelet therapy to ticagrelor.  2. Essential hypertension:  We'll follow blood pressure and continue her home medical program.  3. Hyperlipidemia: Will check a fasting lipid panel and treat her with a high intensity statin drug.  Disposition: Pending cardiac catheterization results.  Enzo Bi MD 09/11/2016, 4:08 PM

## 2016-09-11 NOTE — ED Provider Notes (Signed)
[facility]-EMERGENCY DEPT Provider Note   CSN: [id number] Arrival date & time: 09/11/16  1441     History   Chief Complaint Chief Complaint  Patient presents with  . Chest Pain    HPI Jerl SantosLinda H Iden is a 65 y.o. female with a history of multiple prior MIs, status post CABG x5 in 2013, presents to the emergency department noting a 2 day history of bilateral chest pain, worse on the left radiating to her jaw and bilateral upper extremities associated with nausea and dyspnea on exertion. She states that it feels very similar to her previous MIs. She states that PTA around lunch time she took 3 full strength ASA as well as nitroclycerin. She states that this transiently helped her symptoms but, it then returned. Currently she rates her pain as about a 5/10 in her left chest, tight feeling. She states that this episode started when she was at rest and has waxed and waned with exertion, but never gone completely away.   She states that she has been lost to follow up with her cardiologist for the last few years as she has not had insurance. She states that she has been having intermittent exertional chest pain symptoms similar to this for which she has been taking ASA and Nitro.   HPI  Past Medical History:  Diagnosis Date  . ALLERGIC RHINITIS   . ANEMIA-IRON DEFICIENCY   . ANXIETY   . ASTHMA   . BREAST MASS, LEFT   . CAD (coronary artery disease)    1. s/p inf MI 2007 c/b low BP and brady, req temp pacer and pressors;  tx with DES x 2 to RCA;  2. inf STEMI 10/12 - PL occluded but small, med Rx recommended;  3.  NSTEMI 01/2012 - LHC with 3v CAD adn EF 45%; Echo with mild LVH, inf HK, EF 55-605, grade 1 diast dysfxn, mild AS;  4. s/p CABG 02/04/12: L-LAD, S-RI, S-OM1, S-PD/PLVB ([facility])  . COLONIC POLYPS, HX OF   . COPD   . DIVERTICULITIS, HX OF   . FATIGUE, CHRONIC   . FIBROMYALGIA   . GERD   . GLUCOSE INTOLERANCE   . HYPERLIPIDEMIA 08/23/2007  . HYPERTENSION 08/23/2007  .  HYPOTHYROIDISM   . IBS   . LOW BACK PAIN   . MENOPAUSAL DISORDER   . OBESITY   . OSTEOARTHRITIS   . PERIPHERAL EDEMA   . PERIPHERAL VASCULAR DISEASE 07/15/2009  . RENAL INSUFFICIENCY 03/02/2009  . Somatization disorder 08/23/2007  . Stroke [facility])     Patient Active Problem List   Diagnosis Date Noted  . S/P CABG x 5 02/08/2012  . CAD (coronary artery disease) 02/01/2012  . NSTEMI (non-ST elevated myocardial infarction) (HCC) 02/01/2012  . PERIPHERAL EDEMA 08/28/2010  . BREAST MASS, LEFT 08/19/2009  . PERIPHERAL VASCULAR DISEASE 07/15/2009  . MENOPAUSAL DISORDER 07/15/2009  . SHORTNESS OF BREATH 03/03/2009  . HYPOKALEMIA 03/02/2009  . RENAL INSUFFICIENCY 03/02/2009  . GLUCOSE INTOLERANCE 06/07/2008  . ANEMIA-IRON DEFICIENCY 06/07/2008  . ALLERGIC RHINITIS 06/07/2008  . GERD 06/07/2008  . FATIGUE, CHRONIC 06/07/2008  . CHEST PAIN 06/07/2008  . COLONIC POLYPS, HX OF 06/07/2008  . DIVERTICULITIS, HX OF 06/07/2008  . HYPOTHYROIDISM 08/23/2007  . HYPERLIPIDEMIA 08/23/2007  . OBESITY 08/23/2007  . ANXIETY 08/23/2007  . SOMATIZATION DISORDER 08/23/2007  . HYPERTENSION 08/23/2007  . MYOCARDIAL INFARCTION, HX OF 08/23/2007  . CORONARY ARTERY DISEASE 08/23/2007  . ASTHMA 08/23/2007  . COPD 08/23/2007  . IBS 08/23/2007  . OSTEOARTHRITIS  08/23/2007  . LOW BACK PAIN 08/23/2007  . FIBROMYALGIA 08/23/2007    Past Surgical History:  Procedure Laterality Date  . ABDOMINAL HYSTERECTOMY    . APPENDECTOMY    . CARDIAC CATHETERIZATION    . CARDIAC CATHETERIZATION N/A 09/11/2016   Procedure: Left Heart Cath and Coronary Angiography;  Surgeon: Tonny Bollman, MD;  Location: [facility] INVASIVE CV LAB;  Service: Cardiovascular;  Laterality: N/A;  . CARDIAC CATHETERIZATION N/A 09/11/2016   Procedure: Coronary Balloon Angioplasty;  Surgeon: Tonny Bollman, MD;  Location: [facility] INVASIVE CV LAB;  Service: Cardiovascular;  Laterality: N/A;  . CHOLECYSTECTOMY    . COLON SURGERY    . CORONARY ANGIOPLASTY     . CORONARY ARTERY BYPASS GRAFT  02/04/2012   Procedure: CORONARY ARTERY BYPASS GRAFTING (CABG);  Surgeon: Loreli Slot, MD;  Location: [facility] OR;  Service: Open Heart Surgery;  Laterality: N/A;  CABG x five  using left internal mammary artery and right leg greater saphenous vein harvested endoscopically  . CORONARY STENT PLACEMENT    . LEFT HEART CATHETERIZATION WITH CORONARY ANGIOGRAM N/A 01/30/2012   Procedure: LEFT HEART CATHETERIZATION WITH CORONARY ANGIOGRAM;  Surgeon: Kathleene Hazel, MD;  Location: [facility] CATH LAB;  Service: Cardiovascular;  Laterality: N/A;  . OOPHORECTOMY    . REPLACEMENT TOTAL KNEE    . TONSILLECTOMY      OB History    No data available       Home Medications    Prior to Admission medications   Medication Sig Start Date End Date Taking? Authorizing Provider  albuterol (PROVENTIL HFA;VENTOLIN HFA) 108 (90 BASE) MCG/ACT inhaler Inhale 2 puffs into the lungs every 6 (six) hours as needed. Shortness of breath   Yes Historical Provider, MD  amLODipine (NORVASC) 5 MG tablet Take 5 mg by mouth daily.   Yes Historical Provider, MD  aspirin 325 MG EC tablet Take 650 mg by mouth daily.   Yes Historical Provider, MD  aspirin 81 MG tablet Take 81 mg by mouth 2 (two) times daily. 02/25/12  Yes Scott T Alben Spittle, PA-C  carvedilol (COREG) 25 MG tablet Take 25 mg by mouth 2 (two) times daily with a meal.   Yes Historical Provider, MD  clopidogrel (PLAVIX) 75 MG tablet Take 1 tablet (75 mg total) by mouth daily. 09/13/11  Yes Wendall Stade, MD  levothyroxine (SYNTHROID, LEVOTHROID) 125 MCG tablet Take 1 tablet (125 mcg total) by mouth daily before breakfast. 02/10/12 09/11/16 Yes Gina L Collins, PA-C  losartan (COZAAR) 50 MG tablet Take 1 tablet (50 mg total) by mouth daily. 02/08/12  Yes Erin R Barrett, PA-C  nitroGLYCERIN (NITROSTAT) 0.4 MG SL tablet Place 0.4 mg under the tongue every 5 (five) minutes as needed for chest pain.   Yes Historical Provider, MD  omeprazole  (PRILOSEC) 20 MG capsule Take 20 mg by mouth 2 (two) times daily.     Yes Historical Provider, MD  potassium chloride SA (K-DUR,KLOR-CON) 20 MEQ tablet Take 20 mEq by mouth as directed.   Yes Historical Provider, MD  pravastatin (PRAVACHOL) 40 MG tablet Take 1 tablet (40 mg total) by mouth daily. 09/24/12  Yes Wendall Stade, MD  carvedilol (COREG) 6.25 MG tablet Take 1 tablet (6.25 mg total) by mouth 2 (two) times daily with a meal. 02/25/12 02/24/13  Beatrice Lecher, PA-C  famotidine (PEPCID) 20 MG tablet Take 1 tablet (20 mg total) by mouth at bedtime. 02/25/12 02/24/13  Beatrice Lecher, PA-C  Fluticasone-Salmeterol (ADVAIR) 250-50 MCG/DOSE AEPB Inhale  1 puff into the lungs every 12 (twelve) hours as needed. Shortness of breath    Historical Provider, MD  furosemide (LASIX) 40 MG tablet Take 1 tablet (40 mg total) by mouth as needed (Only when needed for swelling or 3lbs weight gain for a day.). Patient not taking: Reported on 09/11/2016 05/03/14 05/03/15  Laurey Morale, MD  metaxalone (SKELAXIN) 800 MG tablet Take 800 mg by mouth 2 (two) times daily as needed for muscle spasms.     Historical Provider, MD  oxycodone (OXY-IR) 5 MG capsule Take 5 mg by mouth every 4 (four) hours as needed for pain.     Historical Provider, MD    Family History No family history on file.  Social History Social History  Substance Use Topics  . Smoking status: Never Smoker  . Smokeless tobacco: Not on file  . Alcohol use No     Allergies   Sulfonamide derivatives   Review of Systems Review of Systems  Constitutional: Positive for activity change. Negative for chills and fever.  Respiratory: Positive for chest tightness and shortness of breath. Negative for cough.   Cardiovascular: Positive for chest pain. Negative for palpitations.  Gastrointestinal: Positive for nausea. Negative for abdominal pain, diarrhea and vomiting.  Musculoskeletal: Positive for neck pain.  Skin: Negative for rash.  Neurological:  Negative for syncope, weakness, light-headedness and numbness.  All other systems reviewed and are negative.    Physical Exam Updated Vital Signs BP (!) 141/71   Pulse 71   Temp 97.8 F (36.6 C) (Oral)   Resp 14   Ht 4' 11.84" (1.52 m)   Wt 90.7 kg   SpO2 95%   BMI 39.26 kg/m   Physical Exam  Constitutional: She is oriented to person, place, and time. She appears well-developed and well-nourished. No distress.  HENT:  Head: Normocephalic and atraumatic.  Nose: Nose normal.  Mouth/Throat: Oropharynx is clear and moist.  Eyes: Conjunctivae and EOM are normal. Pupils are equal, round, and reactive to light.  Neck: Normal range of motion. Neck supple.  Cardiovascular: Normal rate, regular rhythm and intact distal pulses.   Murmur heard. Pulmonary/Chest: Effort normal and breath sounds normal. She exhibits no tenderness.  Abdominal: Soft. She exhibits no distension. There is no tenderness.  Musculoskeletal: She exhibits no edema or tenderness.  Neurological: She is alert and oriented to person, place, and time. No cranial nerve deficit. Coordination normal.  Skin: Skin is warm and dry. No rash noted. She is not diaphoretic.  Nursing note and vitals reviewed.    ED Treatments / Results  Labs (all labs ordered are listed, but only abnormal results are displayed) Labs Reviewed  BASIC METABOLIC PANEL - Abnormal; Notable for the following:       Result Value   Glucose, Bld 139 (*)    All other components within normal limits  BRAIN NATRIURETIC PEPTIDE - Abnormal; Notable for the following:    B Natriuretic Peptide 161.2 (*)    All other components within normal limits  BASIC METABOLIC PANEL - Abnormal; Notable for the following:    Glucose, Bld 123 (*)    All other components within normal limits  LIPID PANEL - Abnormal; Notable for the following:    Cholesterol 231 (*)    Triglycerides 279 (*)    HDL 36 (*)    VLDL 56 (*)    LDL Cholesterol 139 (*)    All other  components within normal limits  TROPONIN I - Abnormal; Notable for  the following:    Troponin I 1.91 (*)    All other components within normal limits  TROPONIN I - Abnormal; Notable for the following:    Troponin I 6.25 (*)    All other components within normal limits  TROPONIN I - Abnormal; Notable for the following:    Troponin I 5.14 (*)    All other components within normal limits  I-STAT TROPOININ, ED - Abnormal; Notable for the following:    Troponin i, poc 1.24 (*)    All other components within normal limits  MRSA PCR SCREENING  CBC  CBC  CBC  CREATININE, SERUM  POCT ACTIVATED CLOTTING TIME  POCT ACTIVATED CLOTTING TIME    EKG  EKG Interpretation  Date/Time:  Tuesday September 11 2016 15:30:25 EST Ventricular Rate:  80 PR Interval:  168 QRS Duration: 106 QT Interval:  388 QTC Calculation: 448 R Axis:   72 Text Interpretation:  Sinus rhythm Minimal ST depression, anterolateral leads ST elevation in the inferior leads improved - no STEMI citeria Q wave in lead III resolved No acute changes Confirmed by Rhunette Croft, MD, Janey Genta [phone]) on 09/11/2016 3:34:34 PM       Radiology Dg Chest Port 1 View  Result Date: 09/11/2016 CLINICAL DATA:  Status post cardiac catheterization. Nausea and left-sided chest pain. EXAM: PORTABLE CHEST 1 VIEW COMPARISON:  None. FINDINGS: Sequelae of prior CABG are identified. The cardiac silhouette is mildly enlarged. The lungs are hypoinflated with mild bronchovascular crowding. No confluent airspace opacity, edema, sizeable pleural effusion, or pneumothorax is identified. Thoracic spondylosis is noted. IMPRESSION: Mild cardiomegaly and hypoinflation without evidence of acute airspace disease. Electronically Signed   By: Sebastian Ache M.D.   On: 09/11/2016 19:29    Procedures Procedures (including critical care time)  Medications Ordered in ED Medications  carvedilol (COREG) tablet 6.25 mg (6.25 mg Oral Given 09/12/16 0802)  aspirin EC tablet 81  mg (81 mg Oral Given 09/12/16 1055)  amLODipine (NORVASC) tablet 5 mg (5 mg Oral Given 09/12/16 1055)  pravastatin (PRAVACHOL) tablet 40 mg (40 mg Oral Given 09/12/16 1055)  oxyCODONE (Oxy IR/ROXICODONE) immediate release tablet 5 mg (5 mg Oral Given 09/12/16 0412)  levothyroxine (SYNTHROID, LEVOTHROID) tablet 125 mcg (125 mcg Oral Given 09/12/16 0802)  losartan (COZAAR) tablet 50 mg (50 mg Oral Given 09/12/16 1055)  clopidogrel (PLAVIX) tablet 75 mg (75 mg Oral Given 09/12/16 1101)  pantoprazole (PROTONIX) EC tablet 40 mg (40 mg Oral Given 09/12/16 1055)  sodium chloride flush (NS) 0.9 % injection 3 mL (3 mLs Intravenous Given 09/12/16 1058)  sodium chloride flush (NS) 0.9 % injection 3 mL (not administered)  0.9 %  sodium chloride infusion (not administered)  acetaminophen (TYLENOL) tablet 650 mg (650 mg Oral Given 09/12/16 0600)  ondansetron (ZOFRAN) injection 4 mg (not administered)  heparin injection 5,000 Units (5,000 Units Subcutaneous Given 09/12/16 0556)  0.9 %  sodium chloride infusion ( Intravenous Stopped 09/11/16 2314)  nitroGLYCERIN (NITROSTAT) SL tablet 0.4 mg (not administered)  ondansetron (ZOFRAN) injection 4 mg (4 mg Intravenous Given 09/12/16 1055)  morphine 4 MG/ML injection 4 mg (4 mg Intravenous Given 09/11/16 1535)  sodium chloride 0.9 % bolus 1,000 mL (1,000 mLs Intravenous New Bag/Given 09/11/16 1535)  heparin bolus via infusion 4,000 Units (4,000 Units Intravenous Bolus from Bag 09/11/16 1542)     Initial Impression / Assessment and Plan / ED Course  I have reviewed the triage vital signs and the nursing notes.  Pertinent labs & imaging results that  were available during my care of the patient were reviewed by me and considered in my medical decision making (see chart for details).  Clinical Course    65 y.o. female with hx of severe CAD presents with chest pain. EKG shows slight ST elevation in inferior leads. Labs drawn included troponin of 1.24. She was started on heparin.  Already took ASA PTA. Cardiology was consulted and saw the patient at the bedside. They planned to take her directly to the cath lab for further care and assessment.  Final Clinical Impressions(s) / ED Diagnoses   Final diagnoses:  Unstable angina pectoris [facility])  NSTEMI (non-ST elevated myocardial infarction) [facility])    New Prescriptions Current Discharge Medication List       Francoise Ceo, DO 09/12/16 1209    Derwood Kaplan, MD 09/13/16 1316

## 2016-09-12 ENCOUNTER — Encounter (HOSPITAL_COMMUNITY): Payer: Self-pay | Admitting: Cardiovascular Disease

## 2016-09-12 LAB — BASIC METABOLIC PANEL
Anion gap: 9 (ref 5–15)
BUN: 7 mg/dL (ref 6–20)
CALCIUM: 8.9 mg/dL (ref 8.9–10.3)
CHLORIDE: 104 mmol/L (ref 101–111)
CO2: 26 mmol/L (ref 22–32)
CREATININE: 0.8 mg/dL (ref 0.44–1.00)
Glucose, Bld: 123 mg/dL — ABNORMAL HIGH (ref 65–99)
Potassium: 3.9 mmol/L (ref 3.5–5.1)
SODIUM: 139 mmol/L (ref 135–145)

## 2016-09-12 LAB — CBC
HEMATOCRIT: 39.1 % (ref 36.0–46.0)
HEMOGLOBIN: 12.6 g/dL (ref 12.0–15.0)
MCH: 29.1 pg (ref 26.0–34.0)
MCHC: 32.2 g/dL (ref 30.0–36.0)
MCV: 90.3 fL (ref 78.0–100.0)
Platelets: 275 10*3/uL (ref 150–400)
RBC: 4.33 MIL/uL (ref 3.87–5.11)
RDW: 14.6 % (ref 11.5–15.5)
WBC: 9 10*3/uL (ref 4.0–10.5)

## 2016-09-12 LAB — TROPONIN I
TROPONIN I: 5.14 ng/mL — AB (ref <0.03)
TROPONIN I: 6.25 ng/mL — AB (ref <0.03)

## 2016-09-12 LAB — LIPID PANEL
CHOL/HDL RATIO: 6.4 ratio
Cholesterol: 231 mg/dL — ABNORMAL HIGH (ref 0–200)
HDL: 36 mg/dL — AB (ref >40)
LDL CALC: 139 mg/dL — AB (ref 0–99)
Triglycerides: 279 mg/dL — ABNORMAL HIGH (ref <150)
VLDL: 56 mg/dL — ABNORMAL HIGH (ref 0–40)

## 2016-09-12 MED ORDER — FLUTICASONE PROPIONATE 50 MCG/ACT NA SUSP
2.0000 | Freq: Every day | NASAL | Status: DC
  Administered 2016-09-12: 2 via NASAL
  Filled 2016-09-12: qty 16

## 2016-09-12 MED ORDER — ATORVASTATIN CALCIUM 80 MG PO TABS
80.0000 mg | ORAL_TABLET | Freq: Every day | ORAL | Status: DC
  Administered 2016-09-13: 80 mg via ORAL
  Filled 2016-09-12: qty 1

## 2016-09-12 NOTE — Progress Notes (Signed)
    Subjective:  Pt continues to have mild chest discomfort and nausea. No shortness of breath. Chest pain is improved from yesterday.  Objective:  Vital Signs in the last 24 hours: Temp:  [97.4 F (36.3 C)-98.8 F (37.1 C)] 98 F (36.7 C) (11/08 0759) Pulse Rate:  [0-268] 72 (11/08 0800) Resp:  [0-51] 14 (11/08 0800) BP: (88-191)/(46-107) 106/85 (11/08 0800) SpO2:  [0 %-100 %] 92 % (11/08 0800) Weight:  [90.7 kg (199 lb 15.3 oz)] 90.7 kg (199 lb 15.3 oz) (11/07 1519)  Intake/Output from previous day: 11/07 0701 - 11/08 0700 In: 751.7 [P.O.:180; I.V.:571.7] Out: 350 [Urine:350]  Physical Exam: Pt is alert and oriented, NAD HEENT: normal Neck: JVP - normal Lungs: CTA bilaterally CV: RRR with 2/6 systolic murmur at the RUSB Abd: soft, NT, Positive BS, no hepatomegaly Ext: no C/C/E, distal pulses intact and equal, left radial site clear Skin: warm/dry no rash  Lab Results:  Recent Labs  09/11/16 1449 09/11/16 1939  WBC 10.5 10.2  HGB 14.4 12.8  PLT 345 282    Recent Labs  09/11/16 1449 09/11/16 1939  NA 138  --   K 4.2  --   CL 103  --   CO2 23  --   GLUCOSE 139*  --   BUN 7  --   CREATININE 0.81 0.79    Recent Labs  09/11/16 1939 09/12/16 0011  TROPONINI 1.91* 6.25*   Tele: Sinus rhythm, personally reviewed  Assessment/Plan:  1. NSTEMI: secondary to acute occlusion of SVG-PDA/PLA. PCI performed yesterday but unable to reestablish flow with balloon angioplasty and aspiration thrombectomy. We'll continue medical therapy. LV function is mildly depressed with an expected segmental wall motion abnormality with akinesis of the inferior wall. Will check a 2-D echocardiogram to better assess LVEF and presence of any valvular disease. Continue ASA, plavix, carvedilol.   2. Hyperlipidemia: The patient's lipids are elevated with a cholesterol of 231 and LDL of 139. Start atorvastatin 80 mg daily. DC pravastatin.  3. Hypertension: Blood pressure is currently  well controlled on a combination of carvedilol, amlodipine, and losartan.  Dispo: check echo, Zofran for nausea, move to stepdown bed, otherwise continue current Rx. Reviewed prognosis with patient who has 4 of 5 grafts occluded but patent LIMA-LAD, patent left main/left circumflex, and only mild LV dysfunction. Medical therapy is indicated rather than further revascularization.   Tonny Bollman, M.D. 09/12/2016, 9:27 AM

## 2016-09-12 NOTE — Progress Notes (Signed)
CARDIAC REHAB PHASE I   PRE:  Rate/Rhythm: 73 SR with PVC    BP: sitting 141/67    SaO2: 96 2L  MODE:  Ambulation: 74 ft   POST:  Rate/Rhythm: 83 SR with PVC    BP: sitting 156/80     SaO2: 98 2L  Pt sts she has had CP and nausea off and on, esp when she stands up. She wanted to walk a short distance. CP was manageable but she is worried. To recliner in new room. Discussed MI, restrictions, and CRPII (she has never done). She will think about CRPII. Left diet sheet. Will f/u. [phone]   Harriet Masson CES, ACSM 09/12/2016 2:34 PM

## 2016-09-13 ENCOUNTER — Encounter (HOSPITAL_COMMUNITY): Payer: Self-pay | Admitting: *Deleted

## 2016-09-13 ENCOUNTER — Inpatient Hospital Stay (HOSPITAL_COMMUNITY): Payer: Medicare Other

## 2016-09-13 DIAGNOSIS — R079 Chest pain, unspecified: Secondary | ICD-10-CM

## 2016-09-13 LAB — ECHOCARDIOGRAM COMPLETE
AO mean calculated velocity dopler: 142 cm/s
AOPV: 0.58 m/s
AOVTI: 41.4 cm
AV Area VTI index: 0.79 cm2/m2
AV Area VTI: 1.31 cm2
AV Area mean vel: 1.35 cm2
AV Mean grad: 9 mmHg
AV VEL mean LVOT/AV: 0.6
AV pk vel: 202 cm/s
AVA: 1.47 cm2
AVAREAMEANVIN: 0.73 cm2/m2
AVLVOTPG: 5 mmHg
AVPG: 16 mmHg
CHL CUP AV PEAK INDEX: 0.7
CHL CUP AV VALUE AREA INDEX: 0.79
CHL CUP AV VEL: 1.47
CHL CUP DOP CALC LVOT VTI: 26.8 cm
E decel time: 225 msec
E/e' ratio: 11.18
FS: 26 % — AB (ref 28–44)
HEIGHTINCHES: 59.843 in
IV/PV OW: 0.78
LA vol index: 30.5 mL/m2
LA vol: 56.7 mL
LAVOLA4C: 57.2 mL
LDCA: 2.27 cm2
LV TDI E'LATERAL: 8.49
LV e' LATERAL: 8.49 cm/s
LVEEAVG: 11.18
LVEEMED: 11.18
LVOT SV: 61 mL
LVOT diameter: 17 mm
LVOT peak VTI: 0.65 cm
LVOTPV: 117 cm/s
Lateral S' vel: 8.5 cm/s
MV Dec: 225
MVPG: 4 mmHg
MVPKAVEL: 133 m/s
MVPKEVEL: 94.9 m/s
PW: 10.7 mm — AB (ref 0.6–1.1)
TAPSE: 15.2 mm
TDI e' medial: 4.9
WEIGHTICAEL: 3199.32 [oz_av]

## 2016-09-13 LAB — CBC
HCT: 38.9 % (ref 36.0–46.0)
Hemoglobin: 12.2 g/dL (ref 12.0–15.0)
MCH: 28.5 pg (ref 26.0–34.0)
MCHC: 31.4 g/dL (ref 30.0–36.0)
MCV: 90.9 fL (ref 78.0–100.0)
PLATELETS: 265 10*3/uL (ref 150–400)
RBC: 4.28 MIL/uL (ref 3.87–5.11)
RDW: 14.3 % (ref 11.5–15.5)
WBC: 9.7 10*3/uL (ref 4.0–10.5)

## 2016-09-13 MED ORDER — MORPHINE SULFATE (PF) 4 MG/ML IV SOLN
3.0000 mg | Freq: Once | INTRAVENOUS | Status: AC
  Administered 2016-09-13: 3 mg via INTRAVENOUS
  Filled 2016-09-13: qty 1

## 2016-09-13 MED ORDER — ISOSORBIDE MONONITRATE ER 30 MG PO TB24
15.0000 mg | ORAL_TABLET | Freq: Every day | ORAL | Status: DC
  Administered 2016-09-13 – 2016-09-14 (×2): 15 mg via ORAL
  Filled 2016-09-13 (×2): qty 1

## 2016-09-13 NOTE — Progress Notes (Signed)
[id number] Pt stated she could not walk as she had a headache, was nauseated and that lying in bed she was still having CP. Discussed with pt that she would not be discharged until she could walk. Only able to walk 74 ft with [place] yesterday.  MI education completed except for ex ed. Discussed NTG use, MI restrictions, heart healthy diet, risk factors. Pt voiced understanding. Discussed CRP 2 and will refer to [place]. Pt does not think she can attend as she only has one car for her and husband to use. Left brochure and discussed in case situation changes. Will give walking instructions when pt is walking with [place]. Will follow up tomorrow. Luetta Nutting RN BSN 09/13/2016 2:04 PM

## 2016-09-13 NOTE — Progress Notes (Signed)
  Echocardiogram 2D Echocardiogram has been performed.  [name] 09/13/2016, 11:23 AM

## 2016-09-13 NOTE — Progress Notes (Signed)
    Subjective:  Had sharp chest pain last night - felt anxious. Better this am. No complaint at present. No dyspnea or chest pain at time of my interview.  Objective:  Vital Signs in the last 24 hours: Temp:  [97.8 F (36.6 C)-98.8 F (37.1 C)] 98.1 F (36.7 C) (11/09 0743) Pulse Rate:  [69-83] 78 (11/09 0743) Resp:  [11-71] 23 (11/09 0743) BP: (105-156)/(71-90) 123/72 (11/09 0743) SpO2:  [93 %-100 %] 96 % (11/09 0743)  Intake/Output from previous day: 11/08 0701 - 11/09 0700 In: 120 [P.O.:120] Out: 450 [Urine:450]  Physical Exam: Pt is alert and oriented, pleasant obese woman in NAD HEENT: normal Neck: JVP - normal Lungs: CTA bilaterally CV: RRR with 2/6 harsh SEM at the RUSB Abd: soft, NT, Positive BS, no hepatomegaly Ext: no C/C/E, distal pulses intact and equal Skin: warm/dry no rash  Lab Results:  Recent Labs  09/12/16 0856 09/13/16 0227  WBC 9.0 9.7  HGB 12.6 12.2  PLT 275 265    Recent Labs  09/11/16 1449 09/11/16 1939 09/12/16 0856  NA 138  --  139  K 4.2  --  3.9  CL 103  --  104  CO2 23  --  26  GLUCOSE 139*  --  123*  BUN 7  --  7  CREATININE 0.81 0.79 0.80    Recent Labs  09/12/16 0011 09/12/16 0856  TROPONINI 6.25* 5.14*   Cardiac Studies: 2D Echo pending  Tele: NSR - personally reviewed  Assessment/Plan:  1. Acute MI (NSTEMI) - SVG occlusion - inferoposterior infarct pattern. On ASA, plavix, high-intensity statin, beta-blocker, ARB. Will add imdur 15 mg today since recurrent CP overnight. Amublate with Phase I rehab today. Anticipate DC home tomorrow if recurrent CP resolved. Await 2D echo. Trop trending down (peak 6.25).  2. Hyperlipidemia: started on atorvastatin 80 mg  3. HTN: BP controlled on current Rx.  Dispo: keep in SDU today with recurrent CP overnight. If stable anticipate DC tomorrow.  Tonny Bollman, M.D. 09/13/2016, 8:22 AM

## 2016-09-13 NOTE — Progress Notes (Signed)
0240 Pt complained of chest pain crushing and radiating 7/10, with no relief after sublingual nitro for a total of 3 doses and oxy IR. EKG done, vitals trended and MD notified. Orders received for IV pain medicine, will continue to monitor. Pt got some relief after IV pain medicine.

## 2016-09-14 ENCOUNTER — Encounter (HOSPITAL_COMMUNITY): Payer: Self-pay | Admitting: Cardiology

## 2016-09-14 ENCOUNTER — Telehealth: Payer: Self-pay | Admitting: Nurse Practitioner

## 2016-09-14 LAB — CBC
HEMATOCRIT: 37.2 % (ref 36.0–46.0)
HEMOGLOBIN: 12.2 g/dL (ref 12.0–15.0)
MCH: 30.2 pg (ref 26.0–34.0)
MCHC: 32.8 g/dL (ref 30.0–36.0)
MCV: 92.1 fL (ref 78.0–100.0)
Platelets: 245 10*3/uL (ref 150–400)
RBC: 4.04 MIL/uL (ref 3.87–5.11)
RDW: 14.7 % (ref 11.5–15.5)
WBC: 10.2 10*3/uL (ref 4.0–10.5)

## 2016-09-14 MED ORDER — ATORVASTATIN CALCIUM 80 MG PO TABS: 80.0000 mg | ORAL_TABLET | Freq: Every day | ORAL | 6 refills | Status: DC

## 2016-09-14 MED ORDER — PANTOPRAZOLE SODIUM 40 MG PO TBEC: 40.0000 mg | DELAYED_RELEASE_TABLET | Freq: Every day | ORAL | 6 refills | Status: DC

## 2016-09-14 MED ORDER — CARVEDILOL 6.25 MG PO TABS: 6.2500 mg | ORAL_TABLET | Freq: Two times a day (BID) | ORAL | 6 refills | Status: DC

## 2016-09-14 MED ORDER — ISOSORBIDE MONONITRATE ER 30 MG PO TB24: 15.0000 mg | ORAL_TABLET | Freq: Every day | ORAL | 6 refills | Status: DC

## 2016-09-14 NOTE — Progress Notes (Signed)
Patient Name: [name] Date of Encounter: 09/14/2016  Primary Cardiologist: Dr. Christeen Douglas Problem List     Active Problems:   NSTEMI (non-ST elevated myocardial infarction) [facility])     Subjective   Feels good this morning, able to walk with cardiac rehab.  Inpatient Medications    Scheduled Meds: . amLODipine  5 mg Oral Daily  . aspirin EC  81 mg Oral BID  . atorvastatin  80 mg Oral q1800  . carvedilol  6.25 mg Oral BID WC  . clopidogrel  75 mg Oral Daily  . fluticasone  2 spray Each Nare Daily  . heparin  5,000 Units Subcutaneous Q8H  . isosorbide mononitrate  15 mg Oral Daily  . levothyroxine  125 mcg Oral QAC breakfast  . losartan  50 mg Oral Daily  . pantoprazole  40 mg Oral Daily  . sodium chloride flush  3 mL Intravenous Q12H   Continuous Infusions:  PRN Meds: sodium chloride, acetaminophen, nitroGLYCERIN, ondansetron (ZOFRAN) IV, oxyCODONE, sodium chloride flush   Vital Signs    Vitals:   09/13/16 2000 09/13/16 2331 09/14/16 0346 09/14/16 0741  BP:    (!) 113/58  Pulse:    77  Resp:    15  Temp: 98.1 F (36.7 C) 98.5 F (36.9 C) 98.2 F (36.8 C) 98 F (36.7 C)  TempSrc: Oral Oral Oral Oral  SpO2:    95%  Weight:      Height:        Intake/Output Summary (Last 24 hours) at 09/14/16 0948 Last data filed at 09/13/16 1013  Gross per 24 hour  Intake              120 ml  Output                0 ml  Net              120 ml   Filed Weights   09/11/16 1519  Weight: 199 lb 15.3 oz (90.7 kg)    Physical Exam    GEN: Well nourished, well developed, in no acute distress.  HEENT: Grossly normal.  Neck: Supple, no JVD, carotid bruits, or masses. Cardiac: RRR, 2/6 systolic murmur, no rubs, or gallops. No clubbing, cyanosis, edema.  Radials/DP/PT 2+ and equal bilaterally.  Respiratory:  Respirations regular and unlabored, clear to auscultation bilaterally. GI: Soft, nontender, nondistended, BS + x 4. MS: no deformity or atrophy. Skin:  warm and dry, no rash. Right radial cath site stable.  Neuro:  Strength and sensation are intact. Psych: AAOx3.  Normal affect.  Labs    CBC  Recent Labs  09/13/16 0227 09/14/16 0236  WBC 9.7 10.2  HGB 12.2 12.2  HCT 38.9 37.2  MCV 90.9 92.1  PLT 265 245   Basic Metabolic Panel  Recent Labs  09/11/16 1449 09/11/16 1939 09/12/16 0856  NA 138  --  139  K 4.2  --  3.9  CL 103  --  104  CO2 23  --  26  GLUCOSE 139*  --  123*  BUN 7  --  7  CREATININE 0.81 0.79 0.80  CALCIUM 10.1  --  8.9   Liver Function Tests No results for input(s): AST, ALT, ALKPHOS, BILITOT, PROT, ALBUMIN in the last 72 hours. No results for input(s): LIPASE, AMYLASE in the last 72 hours. Cardiac Enzymes  Recent Labs  09/11/16 1939 09/12/16 0011 09/12/16 0856  TROPONINI 1.91* 6.25* 5.14*   BNP  Invalid input(s): POCBNP D-Dimer No results for input(s): DDIMER in the last 72 hours. Hemoglobin A1C No results for input(s): HGBA1C in the last 72 hours. Fasting Lipid Panel  Recent Labs  09/12/16 0256  CHOL 231*  HDL 36*  LDLCALC 139*  TRIG 279*  CHOLHDL 6.4   Thyroid Function Tests No results for input(s): TSH, T4TOTAL, T3FREE, THYROIDAB in the last 72 hours.  Invalid input(s): FREET3  Telemetry    SR - Personally Reviewed  ECG    N/A - Personally Reviewed  Radiology    No results found.  Cardiac Studies   LHC: 09/11/16  Conclusion   1. Severe native three-vessel coronary artery disease with total occlusion of the RCA, total occlusion of the mid LAD, and total occlusion of the first OM 2. Status post aortocoronary bypass surgery with continued patency of the LIMA to LAD, chronic total occlusion of the saphenous vein graft sequential to ramus and first OM, and acute total occlusion of the sequential saphenous vein graft to PDA and PLA 3. Mild contraction abnormality of the left ventricle with inferior wall akinesis and LVEF estimated at 50% 4. Unsuccessful balloon  angioplasty and aspiration thrombectomy of the saphenous vein graft to PDA and PLA with TIMI 0 flow at the completion of the procedure  Recommend: Post-MI medical therapy   Echo: 09/13/16  Study Conclusions  - Left ventricle: The cavity size was normal. Systolic function was   normal. The estimated ejection fraction was in the range of 55%   to 60%. Wall motion was normal; there were no regional wall   motion abnormalities. There was an increased relative   contribution of atrial contraction to ventricular filling.   Doppler parameters are consistent with abnormal left ventricular   relaxation (grade 1 diastolic dysfunction). Doppler parameters   are consistent with high ventricular filling pressure. - Aortic valve: Poorly visualized. There was mild to moderate   stenosis. Mean gradient (S): 9 mm Hg. Valve area (VTI): 1.47   cm^2. Valve area (Vmax): 1.31 cm^2. Valve area (Vmean): 1.35   cm^2. - Mitral valve: Calcified annulus. - Atrial septum: There was increased thickness of the septum,   consistent with lipomatous hypertrophy. - Pulmonary arteries: Systolic pressure could not be accurately   estimated.  Patient Profile     65 y.o. female w/ PMHx significant for CAD and previous CABG who [facility]2016 with complaints of chest pain. She underwent LHC on 11/7 with Dr. Excell Seltzerooper showing acute occlusion of SVG-PDA/PLA with unsuccessful attempt at PCI. Planned to treat with medical therapy.   Assessment & Plan    1. NSTEMI: SVG occlusion - inferoposterior infarct pattern. On ASA, plavix, high-intensity statin, beta-blocker, ARB. Added imdur 15 mg yesterday, no recurrent chest pain today. Ambulated with cardiac rehab and felt well, able to go 17250ft.  -- Trop trending down (peak 6.25) -- 2D echo showed EF of 55-60% with no regional wall abnormalities.   2. Hyperlipidemia: started on atorvastatin 80 mg  3. HTN: BP controlled on current  Rx.  Signed, Laverda PageLindsay Roberts, NP  09/14/2016, 9:48 AM   Patient seen, examined. Available data reviewed. Agree with findings, assessment, and plan as outlined by Laverda PageLindsay Roberts, NP. Exam reveals alert, oriented woman in NAD. Lungs CTA, CV RRR with 2/6 systolic murmur at the LLSB, ext without edema.   Echo shows remarkably well-preserved LV function and mild AS.   Medical therapy as outlined above. OK for discharge home today.   Casimiro NeedleMichael  Excell Seltzer, M.D. 09/14/2016 12:10 PM

## 2016-09-14 NOTE — Discharge Summary (Signed)
Discharge Summary    Patient ID: [name],  MRN: [id number], DOB/AGE: 65/02/1951 65 y.o.  Admit date: 09/11/2016 Discharge date: 09/14/2016  Primary Care Provider: Oliver BarreJames John Primary Cardiologist: Dr. Eden EmmsNishan  Discharge Diagnoses    Active Problems:   NSTEMI (non-ST elevated myocardial infarction) [facility](HCC)   Essential hypertension   Coronary atherosclerosis   Allergies Allergies  Allergen Reactions  . Sulfonamide Derivatives Rash    Diagnostic Studies/Procedures    LHC: 09/11/16  Conclusion   1. Severe native three-vessel coronary artery disease with total occlusion of the RCA, total occlusion of the mid LAD, and total occlusion of the first OM 2. Status post aortocoronary bypass surgery with continued patency of the LIMA to LAD, chronic total occlusion of the saphenous vein graft sequential to ramus and first OM, and acute total occlusion of the sequential saphenous vein graft to PDA and PLA 3. Mild contraction abnormality of the left ventricle with inferior wall akinesis and LVEF estimated at 50% 4. Unsuccessful balloon angioplasty and aspiration thrombectomy of the saphenous vein graft to PDA and PLA with TIMI 0 flow at the completion of the procedure  Recommend: Post-MI medical therapy   Diagnostic Diagram     Post-Intervention Diagram        Echo: 09/13/16   Study Conclusions  - Left ventricle: The cavity size was normal. Systolic function was   normal. The estimated ejection fraction was in the range of 55%   to 60%. Wall motion was normal; there were no regional wall   motion abnormalities. There was an increased relative   contribution of atrial contraction to ventricular filling.   Doppler parameters are consistent with abnormal left ventricular   relaxation (grade 1 diastolic dysfunction). Doppler parameters   are consistent with high ventricular filling pressure. - Aortic valve: Poorly visualized. There was mild to moderate   stenosis. Mean  gradient (S): 9 mm Hg. Valve area (VTI): 1.47   cm^2. Valve area (Vmax): 1.31 cm^2. Valve area (Vmean): 1.35   cm^2. - Mitral valve: Calcified annulus. - Atrial septum: There was increased thickness of the septum,   consistent with lipomatous hypertrophy. - Pulmonary arteries: Systolic pressure could not be accurately   estimated. _____________   History of Present Illness    65 y.o. female w/ PMHx significant for CAD and previous CABG who [facility] 09/11/2016 with complaints of chest pain.  The patient complained of pain in her left chest radiating into the center of the chest described as a pressure-like sensation. Symptoms of been intermittent for 2 days. Symptoms worsened the night prior to admission and became quite severe. She presented to the emergency room on 09/11/16. She was concerned about the cost that she will incur with hospitalization and medical care.  At the time of interview, the patient's husband is at the bedside. She continued to complain of chest discomfort rated at 5/10. There is associated shortness of breath, fatigue, radiation of pain into the neck, jaw, and both arms. She denied diaphoresis, orthopnea, or PND. She has had intermittent nausea and vomiting.  She had borderline changes on her EKG concerning for STEMI, but previous EKG in 2013 was suggestive of inferior infarct. It was felt that her EKG was not diagnostic of a STEMI, but with on-going reports of chest discomfort she was taken for cardiac catheterization with possible PCI.   Hospital Course     Consultants: none  LHC showed severe native 3 vessel disease with  total occlusion of the RCA, total occlusion of mid LAD, and total occlusion of the first OM. Also noted continued patency of the LIMA to LAD, with total occlusion of the SVG to ramus and firs OM with acute occlusion of the SVG to the PDA/PLA. Attempts were made at balloon angioplasty and aspiration thrombectomy of the SVG to  PDA/PLA but were unsuccessful. EF by visual estimate was 50%. Given her lipid panel, she was changed from pravastatin 40mg  to atorvastatin 80mg .   Follow up 2D echo showed EF of 55-60% with no wall motion abnormality, with G1DD. Also noted mild to moderate AS. She was transferred to stepdown on 09/12/16, and planned for medical therapy. She reported some recurrent chest pain and Imdur was added to her medication regimen. Initially had some difficulty working with cardiac rehab, but was able to ambulate 130ft on 11/10 without recurrent angina. Her troponin peaked at 6.25, with a downward trend.   She was seen and assessed by Dr. Excell Seltzer on 11/10 and determined stable for discharge home. Follow up has been arranged in the office.  _____________  Discharge Vitals Blood pressure 105/63, pulse 77, temperature 98.4 F (36.9 C), temperature source Oral, resp. rate 19, height 4' 11.84" (1.52 m), weight 199 lb 15.3 oz (90.7 kg), SpO2 94 %.  Filed Weights   09/11/16 1519  Weight: 199 lb 15.3 oz (90.7 kg)    Labs & Radiologic Studies    CBC  Recent Labs  09/13/16 0227 09/14/16 0236  WBC 9.7 10.2  HGB 12.2 12.2  HCT 38.9 37.2  MCV 90.9 92.1  PLT 265 245   Basic Metabolic Panel  Recent Labs  09/11/16 1449 09/11/16 1939 09/12/16 0856  NA 138  --  139  K 4.2  --  3.9  CL 103  --  104  CO2 23  --  26  GLUCOSE 139*  --  123*  BUN 7  --  7  CREATININE 0.81 0.79 0.80  CALCIUM 10.1  --  8.9   Liver Function Tests No results for input(s): AST, ALT, ALKPHOS, BILITOT, PROT, ALBUMIN in the last 72 hours. No results for input(s): LIPASE, AMYLASE in the last 72 hours. Cardiac Enzymes  Recent Labs  09/11/16 1939 09/12/16 0011 09/12/16 0856  TROPONINI 1.91* 6.25* 5.14*   BNP Invalid input(s): POCBNP D-Dimer No results for input(s): DDIMER in the last 72 hours. Hemoglobin A1C No results for input(s): HGBA1C in the last 72 hours. Fasting Lipid Panel  Recent Labs  09/12/16 0256    CHOL 231*  HDL 36*  LDLCALC 139*  TRIG 279*  CHOLHDL 6.4   Thyroid Function Tests No results for input(s): TSH, T4TOTAL, T3FREE, THYROIDAB in the last 72 hours.  Invalid input(s): FREET3 _____________  Dg Chest Port 1 View  Result Date: 09/11/2016 CLINICAL DATA:  Status post cardiac catheterization. Nausea and left-sided chest pain. EXAM: PORTABLE CHEST 1 VIEW COMPARISON:  None. FINDINGS: Sequelae of prior CABG are identified. The cardiac silhouette is mildly enlarged. The lungs are hypoinflated with mild bronchovascular crowding. No confluent airspace opacity, edema, sizeable pleural effusion, or pneumothorax is identified. Thoracic spondylosis is noted. IMPRESSION: Mild cardiomegaly and hypoinflation without evidence of acute airspace disease. Electronically Signed   By: Sebastian Ache M.D.   On: 09/11/2016 19:29   Disposition   Pt is being discharged home today in good condition.  Follow-up Plans & Appointments    Follow-up Information    Norma Fredrickson, NP Follow up on 09/21/2016.   Specialties:  Nurse Practitioner, Interventional Cardiology, Cardiology, Radiology Why:  at 2:45pm for your hospital follow appt.  Contact information: [address] [place] [address] [phone]          Discharge Instructions    Amb Referral to Cardiac Rehabilitation    Complete by:  As directed    Diagnosis:   NSTEMI PTCA     Call MD for:  redness, tenderness, or signs of infection (pain, swelling, redness, odor or green/yellow discharge around incision site)    Complete by:  As directed    Diet - low sodium heart healthy    Complete by:  As directed    Discharge instructions    Complete by:  As directed    Radial Site Care Refer to this sheet in the next few weeks. These instructions provide you with information on caring for yourself after your procedure. Your caregiver may also give you more specific instructions. Your treatment has been planned according to  current medical practices, but problems sometimes occur. Call your caregiver if you have any problems or questions after your procedure. HOME CARE INSTRUCTIONS You may shower the day after the procedure.Remove the bandage (dressing) and gently wash the site with plain soap and water.Gently pat the site dry.  Do not apply powder or lotion to the site.  Do not submerge the affected site in water for 3 to 5 days.  Inspect the site at least twice daily.  Do not flex or bend the affected arm for 24 hours.  No lifting over 5 pounds (2.3 kg) for 5 days after your procedure.  Do not drive home if you are discharged the same day of the procedure. Have someone else drive you.  You may drive 24 hours after the procedure unless otherwise instructed by your caregiver.  What to expect: Any bruising will usually fade within 1 to 2 weeks.  Blood that collects in the tissue (hematoma) may be painful to the touch. It should usually decrease in size and tenderness within 1 to 2 weeks.  SEEK IMMEDIATE MEDICAL CARE IF: You have unusual pain at the radial site.  You have redness, warmth, swelling, or pain at the radial site.  You have drainage (other than a small amount of blood on the dressing).  You have chills.  You have a fever or persistent symptoms for more than 72 hours.  You have a fever and your symptoms suddenly get worse.  Your arm becomes pale, cool, tingly, or numb.  You have heavy bleeding from the site. Hold pressure on the site.   Increase activity slowly    Complete by:  As directed       Discharge Medications   Current Discharge Medication List    START taking these medications   Details  atorvastatin (LIPITOR) 80 MG tablet Take 1 tablet (80 mg total) by mouth daily at 6 PM. Qty: 30 tablet, Refills: 6    isosorbide mononitrate (IMDUR) 30 MG 24 hr tablet Take 0.5 tablets (15 mg total) by mouth daily. Qty: 30 tablet, Refills: 6    pantoprazole (PROTONIX) 40 MG tablet Take 1 tablet  (40 mg total) by mouth daily. Qty: 30 tablet, Refills: 6      CONTINUE these medications which have CHANGED   Details  carvedilol (COREG) 6.25 MG tablet Take 1 tablet (6.25 mg total) by mouth 2 (two) times daily with a meal. Qty: 60 tablet, Refills: 6      CONTINUE these medications which have  NOT CHANGED   Details  albuterol (PROVENTIL HFA;VENTOLIN HFA) 108 (90 BASE) MCG/ACT inhaler Inhale 2 puffs into the lungs every 6 (six) hours as needed. Shortness of breath    amLODipine (NORVASC) 5 MG tablet Take 5 mg by mouth daily.    aspirin 81 MG tablet Take 81 mg by mouth 2 (two) times daily.    clopidogrel (PLAVIX) 75 MG tablet Take 1 tablet (75 mg total) by mouth daily. Qty: 30 tablet, Refills: 12    levothyroxine (SYNTHROID, LEVOTHROID) 125 MCG tablet Take 1 tablet (125 mcg total) by mouth daily before breakfast. Qty: 30 tablet, Refills: 1    losartan (COZAAR) 50 MG tablet Take 1 tablet (50 mg total) by mouth daily. Qty: 30 tablet, Refills: 1    nitroGLYCERIN (NITROSTAT) 0.4 MG SL tablet Place 0.4 mg under the tongue every 5 (five) minutes as needed for chest pain.    potassium chloride SA (K-DUR,KLOR-CON) 20 MEQ tablet Take 20 mEq by mouth as directed.    Fluticasone-Salmeterol (ADVAIR) 250-50 MCG/DOSE AEPB Inhale 1 puff into the lungs every 12 (twelve) hours as needed. Shortness of breath    furosemide (LASIX) 40 MG tablet Take 1 tablet (40 mg total) by mouth as needed (Only when needed for swelling or 3lbs weight gain for a day.). Qty: 30 tablet, Refills: 3    metaxalone (SKELAXIN) 800 MG tablet Take 800 mg by mouth 2 (two) times daily as needed for muscle spasms.     oxycodone (OXY-IR) 5 MG capsule Take 5 mg by mouth every 4 (four) hours as needed for pain.       STOP taking these medications     aspirin 325 MG EC tablet      omeprazole (PRILOSEC) 20 MG capsule      pravastatin (PRAVACHOL) 40 MG tablet      famotidine (PEPCID) 20 MG tablet          Aspirin  prescribed at discharge?  Yes High Intensity Statin Prescribed? (Lipitor 40-80mg  or Crestor 20-40mg ): Yes Beta Blocker Prescribed? Yes For EF <40%, was ACEI/ARB Prescribed? Yes ADP Receptor Inhibitor Prescribed? (i.e. Plavix etc.-Includes Medically Managed Patients): Yes For EF <40%, Aldosterone Inhibitor Prescribed? No: EF ok Was EF assessed during THIS hospitalization? Yes Was Cardiac Rehab II ordered? (Included Medically managed Patients): Yes   Outstanding Labs/Studies   FLP and LFTs if able to tolerate statin.   Duration of Discharge Encounter   Greater than 30 minutes including physician time.  Signed, Laverda Page NP-C 09/14/2016, 12:45 PM

## 2016-09-14 NOTE — Telephone Encounter (Signed)
New message       TCM appt on 09-21-16 with Norma Fredrickson per Lillia Abed.

## 2016-09-14 NOTE — Progress Notes (Signed)
CARDIAC REHAB PHASE I   PRE:  Rate/Rhythm:81 SR  BP:  Sitting: 109/64        SaO2: 97 RA  MODE:  Ambulation: 150 ft   POST:  Rate/Rhythm: 90 SR  BP:  Sitting: 121/71         SaO2: 98 RA  Pt c/o headache and nausea this morning. Pt ambulated 150 ft on RA, rolling walker, assist x1, slow, steady gait, tolerated well. Pt anxious, c/o mild DOE, mild dizziness, denies any other complaints, brief standing rest x2. Reinforced education, reviewed exercise guidelines. Pt to recliner after walk, feet elevated, call bell within reach.   [id number] Joylene Grapes, RN, BSN 09/14/2016 9:57 AM

## 2016-09-17 NOTE — Telephone Encounter (Signed)
Patient contacted regarding discharge from [facility] on September 14, 2016.  Patient understands to follow up with provider Norma Fredrickson, NP on September 21, 2016 at 3pm at [facility]. Patient understands discharge instructions? yes Patient understands medications and regiment? yes Patient understands to bring all medications to this visit? yes

## 2016-09-17 NOTE — Telephone Encounter (Signed)
Spoke with pt's husband and asked that he have pt call back so we can review follow up with her.  Husband does state that they are aware of appt on 09/21/16. Husband will  Have pt call back.

## 2016-09-19 ENCOUNTER — Telehealth (HOSPITAL_COMMUNITY): Payer: Self-pay | Admitting: Cardiac Rehabilitation

## 2016-09-19 NOTE — Telephone Encounter (Signed)
pc to pt to discuss enrolling in cardiac rehab. Pt husband states transportation is problem for them as they only have one car and he would need to provide transportation of her late in evening after he gets home from work.  At pt husband request, referral sent to [facility] program.

## 2016-09-21 ENCOUNTER — Other Ambulatory Visit: Payer: Self-pay | Admitting: *Deleted

## 2016-09-21 ENCOUNTER — Ambulatory Visit (INDEPENDENT_AMBULATORY_CARE_PROVIDER_SITE_OTHER): Payer: Medicare Other | Admitting: Nurse Practitioner

## 2016-09-21 ENCOUNTER — Encounter: Payer: Self-pay | Admitting: Nurse Practitioner

## 2016-09-21 VITALS — BP 170/100 | HR 80 | Ht 59.5 in | Wt 196.8 lb

## 2016-09-21 DIAGNOSIS — Z9889 Other specified postprocedural states: Secondary | ICD-10-CM | POA: Diagnosis not present

## 2016-09-21 DIAGNOSIS — I259 Chronic ischemic heart disease, unspecified: Secondary | ICD-10-CM

## 2016-09-21 DIAGNOSIS — I214 Non-ST elevation (NSTEMI) myocardial infarction: Secondary | ICD-10-CM | POA: Diagnosis not present

## 2016-09-21 DIAGNOSIS — E7800 Pure hypercholesterolemia, unspecified: Secondary | ICD-10-CM

## 2016-09-21 DIAGNOSIS — I200 Unstable angina: Secondary | ICD-10-CM

## 2016-09-21 DIAGNOSIS — R7309 Other abnormal glucose: Secondary | ICD-10-CM

## 2016-09-21 LAB — CBC
HCT: 40.1 % (ref 35.0–45.0)
Hemoglobin: 13 g/dL (ref 11.7–15.5)
MCH: 29 pg (ref 27.0–33.0)
MCHC: 32.4 g/dL (ref 32.0–36.0)
MCV: 89.3 fL (ref 80.0–100.0)
MPV: 8.8 fL (ref 7.5–12.5)
Platelets: 329 10*3/uL (ref 140–400)
RBC: 4.49 MIL/uL (ref 3.80–5.10)
RDW: 14.4 % (ref 11.0–15.0)
WBC: 8.5 10*3/uL (ref 3.8–10.8)

## 2016-09-21 LAB — HEMOGLOBIN A1C
Hgb A1c MFr Bld: 6.2 % — ABNORMAL HIGH (ref <5.7)
Mean Plasma Glucose: 131 mg/dL

## 2016-09-21 LAB — TSH: TSH: 1.69 mIU/L

## 2016-09-21 LAB — BASIC METABOLIC PANEL
BUN: 9 mg/dL (ref 7–25)
CO2: 24 mmol/L (ref 20–31)
Calcium: 8.8 mg/dL (ref 8.6–10.4)
Chloride: 106 mmol/L (ref 98–110)
Creat: 0.82 mg/dL (ref 0.50–0.99)
Glucose, Bld: 106 mg/dL — ABNORMAL HIGH (ref 65–99)
Potassium: 4.3 mmol/L (ref 3.5–5.3)
Sodium: 141 mmol/L (ref 135–146)

## 2016-09-21 MED ORDER — CLOPIDOGREL BISULFATE 75 MG PO TABS: 75.0000 mg | ORAL_TABLET | Freq: Every day | ORAL | 3 refills | Status: DC

## 2016-09-21 MED ORDER — PANTOPRAZOLE SODIUM 40 MG PO TBEC: 40.0000 mg | DELAYED_RELEASE_TABLET | Freq: Two times a day (BID) | ORAL | 6 refills | Status: DC

## 2016-09-21 MED ORDER — ISOSORBIDE MONONITRATE ER 60 MG PO TB24: 60.0000 mg | ORAL_TABLET | Freq: Every day | ORAL | 3 refills | Status: DC

## 2016-09-21 MED ORDER — ROSUVASTATIN CALCIUM 20 MG PO TABS: 20.0000 mg | ORAL_TABLET | Freq: Every day | ORAL | 3 refills | Status: DC

## 2016-09-21 MED ORDER — RANOLAZINE ER 500 MG PO TB12: 500.0000 mg | ORAL_TABLET | Freq: Two times a day (BID) | ORAL | 6 refills | Status: DC

## 2016-09-21 NOTE — Progress Notes (Addendum)
CARDIOLOGY OFFICE NOTE  Date:  09/21/2016    [name] Date of Birth: 1951-05-13 Medical Record #[id number]  PCP:  Dema Severin, NP  Cardiologist:  Eden Emms.   Chief Complaint  Patient presents with  . Coronary Artery Disease    Post hospital/TOC visit - seen for Dr. Eden Emms    History of Present Illness: [name] is a 65 y.o. female who presents today for a post hospital visit/TOC visit. Seen for Dr. Eden Emms.    She has not been seen here since April of 2013.   She has a history of known CAD and previous CABGwho presented [facility]with complaints of chest pain. She was concerned about the cost that she will incur with hospitalization and medical care.  She had borderline changes on her EKG concerning for STEMI, but previous EKG in 2013 was suggestive of inferior infarct. It was felt that her EKG was not diagnostic of a STEMI, but with on-going reports of chest discomfort she was taken for cardiac catheterization for possible PCI.   LHC showed severe native 3 vessel disease with total occlusion of the RCA, total occlusion of mid LAD, and total occlusion of the first OM. Also noted continued patency of the LIMA to LAD, with total occlusion of the SVG to ramus and firs OM with acute occlusion of the SVG to the PDA/PLA. Attempts were made at balloon angioplasty and aspiration thrombectomy of the SVG to PDA/PLA but were unsuccessful. EF by visual estimate was 50%. Given her lipid panel, she was changed from pravastatin 40mg  to atorvastatin 80mg .   Follow up 2D echo showed EF of 55-60% with no wall motion abnormality, with G1DD. Also noted mild to moderate AS. She was transferred to stepdown on 09/12/16, and planned for medical therapy. She reported some recurrent chest pain and Imdur was added to her medication regimen. Initially had some difficulty working with cardiac rehab, but was able to ambulate 186ft on 11/10 without recurrent angina. Her  troponin peaked at 6.25, with a downward trend.   She was seen and assessed by Dr. Excell Seltzer on 11/10 and determined stable for discharge home.   Comes in today. Here with her husband. She is not doing well. Still with chest pain. Says she is no different than when she was in the hospital. Short of breath. Can't really do anything. Eating makes her have chest pain. Walking short distances makes her have chest pain. Pretty sedentary due to chest pain. Having trouble with swallowing Lipitor 80 mg. Says she has trouble with swallowing big pills. Has not used any Lasix since she has been home. Very worried about costs in general.   Past Medical History:  Diagnosis Date  . ALLERGIC RHINITIS   . ANEMIA-IRON DEFICIENCY   . ANXIETY   . ASTHMA   . BREAST MASS, LEFT   . CAD (coronary artery disease)    1. s/p inf MI 2007 c/b low BP and brady, req temp pacer and pressors;  tx with DES x 2 to RCA;  2. inf STEMI 10/12 - PL occluded but small, med Rx recommended;  3.  NSTEMI 01/2012 - LHC with 3v CAD adn EF 45%; Echo with mild LVH, inf HK, EF 55-605, grade 1 diast dysfxn, mild AS;  4. s/p CABG 02/04/12: L-LAD, S-RI, S-OM1, S-PD/PLVB ([facility]) 5. NSTEMI 09/11/16 patent LIMA to LAD, EF 55-60%  . COLONIC POLYPS, HX OF   . COPD   . DIVERTICULITIS, HX OF   .  FATIGUE, CHRONIC   . FIBROMYALGIA   . GERD   . GLUCOSE INTOLERANCE   . HYPERLIPIDEMIA 08/23/2007  . HYPERTENSION 08/23/2007  . HYPOTHYROIDISM   . IBS   . LOW BACK PAIN   . MENOPAUSAL DISORDER   . OBESITY   . OSTEOARTHRITIS   . PERIPHERAL EDEMA   . PERIPHERAL VASCULAR DISEASE 07/15/2009  . RENAL INSUFFICIENCY 03/02/2009  . Somatization disorder 08/23/2007  . Stroke [facility])     Past Surgical History:  Procedure Laterality Date  . ABDOMINAL HYSTERECTOMY    . APPENDECTOMY    . CARDIAC CATHETERIZATION    . CARDIAC CATHETERIZATION N/A 09/11/2016   Procedure: Left Heart Cath and Coronary Angiography;  Surgeon: Tonny Bollman, MD;  Location: [facility] INVASIVE  CV LAB;  Service: Cardiovascular;  Laterality: N/A;  . CARDIAC CATHETERIZATION N/A 09/11/2016   Procedure: Coronary Balloon Angioplasty;  Surgeon: Tonny Bollman, MD;  Location: [facility] INVASIVE CV LAB;  Service: Cardiovascular;  Laterality: N/A;  . CHOLECYSTECTOMY    . COLON SURGERY    . CORONARY ANGIOPLASTY    . CORONARY ARTERY BYPASS GRAFT  02/04/2012   Procedure: CORONARY ARTERY BYPASS GRAFTING (CABG);  Surgeon: Loreli Slot, MD;  Location: [facility] OR;  Service: Open Heart Surgery;  Laterality: N/A;  CABG x five  using left internal mammary artery and right leg greater saphenous vein harvested endoscopically  . CORONARY STENT PLACEMENT    . LEFT HEART CATHETERIZATION WITH CORONARY ANGIOGRAM N/A 01/30/2012   Procedure: LEFT HEART CATHETERIZATION WITH CORONARY ANGIOGRAM;  Surgeon: Kathleene Hazel, MD;  Location: [facility] CATH LAB;  Service: Cardiovascular;  Laterality: N/A;  . OOPHORECTOMY    . REPLACEMENT TOTAL KNEE    . TONSILLECTOMY       Medications: Current Outpatient Prescriptions  Medication Sig Dispense Refill  . amLODipine (NORVASC) 5 MG tablet Take 5 mg by mouth daily.    Marland Kitchen aspirin 81 MG tablet Take 81 mg by mouth 2 (two) times daily.    Marland Kitchen atorvastatin (LIPITOR) 80 MG tablet Take 1 tablet (80 mg total) by mouth daily at 6 PM. 30 tablet 6  . carvedilol (COREG) 6.25 MG tablet Take 1 tablet (6.25 mg total) by mouth 2 (two) times daily with a meal. 60 tablet 6  . clopidogrel (PLAVIX) 75 MG tablet Take 1 tablet (75 mg total) by mouth daily. 30 tablet 12  . Fluticasone-Salmeterol (ADVAIR) 250-50 MCG/DOSE AEPB Inhale 1 puff into the lungs every 12 (twelve) hours as needed. Shortness of breath    . furosemide (LASIX) 40 MG tablet Take 1 tablet (40 mg total) by mouth as needed (Only when needed for swelling or 3lbs weight gain for a day.). 30 tablet 3  . isosorbide mononitrate (IMDUR) 30 MG 24 hr tablet Take 0.5 tablets (15 mg total) by mouth daily. 30 tablet 6  . levothyroxine (SYNTHROID,  LEVOTHROID) 125 MCG tablet Take 1 tablet (125 mcg total) by mouth daily before breakfast. 30 tablet 1  . losartan (COZAAR) 100 MG tablet Take 100 mg by mouth daily.    . nitroGLYCERIN (NITROSTAT) 0.4 MG SL tablet Place 0.4 mg under the tongue every 5 (five) minutes as needed for chest pain.    . pantoprazole (PROTONIX) 40 MG tablet Take 1 tablet (40 mg total) by mouth daily. 30 tablet 6  . potassium chloride SA (K-DUR,KLOR-CON) 20 MEQ tablet Take 20 mEq by mouth as directed.    Marland Kitchen albuterol (PROVENTIL HFA;VENTOLIN HFA) 108 (90 BASE) MCG/ACT inhaler Inhale 2 puffs into the  lungs every 6 (six) hours as needed. Shortness of breath    . metaxalone (SKELAXIN) 800 MG tablet Take 800 mg by mouth 2 (two) times daily as needed for muscle spasms.     Marland Kitchen. oxycodone (OXY-IR) 5 MG capsule Take 5 mg by mouth every 4 (four) hours as needed for pain.      No current facility-administered medications for this visit.     Allergies: Allergies  Allergen Reactions  . Sulfonamide Derivatives Rash    Social History: The patient  reports that she has never smoked. She has never used smokeless tobacco. She reports that she does not drink alcohol or use drugs.   Family History: The patient's family history is not on file.   Review of Systems: Please see the history of present illness.   Otherwise, the review of systems is positive for none.   All other systems are reviewed and negative.   Physical Exam: VS:  BP (!) 170/100   Pulse 80   Ht 4' 11.5" (1.511 m)   Wt 196 lb 12.8 oz (89.3 kg)   BMI 39.08 kg/m  .  BMI Body mass index is 39.08 kg/m.  Wt Readings from Last 3 Encounters:  09/21/16 196 lb 12.8 oz (89.3 kg)  09/11/16 199 lb 15.3 oz (90.7 kg)  03/25/12 200 lb (90.7 kg)   BP was 170/100 by me.   General: Pleasant. Seems anxious. She is alert and in no acute distress. Very short stature. She is obese.    HEENT: Normal.  Neck: Supple, no JVD, carotid bruits, or masses noted.  Cardiac: Regular rate  and rhythm. Harsh outflow murmur.  No edema.  Respiratory:  Lungs are clear to auscultation bilaterally with normal work of breathing.  GI: Soft and nontender.  MS: No deformity or atrophy. Gait and ROM intact.  Skin: Warm and dry. Color is normal.  Neuro:  Strength and sensation are intact and no gross focal deficits noted.  Psych: Alert, appropriate and with normal affect.   LABORATORY DATA:  EKG:  EKG is ordered today. This shows NSR, inferior infarct - reviewed with Dr. Excell Seltzerooper here in the office today.   Lab Results  Component Value Date   WBC 10.2 09/14/2016   HGB 12.2 09/14/2016   HCT 37.2 09/14/2016   PLT 245 09/14/2016   GLUCOSE 123 (H) 09/12/2016   CHOL 231 (H) 09/12/2016   TRIG 279 (H) 09/12/2016   HDL 36 (L) 09/12/2016   LDLDIRECT 168.9 08/22/2010   LDLCALC 139 (H) 09/12/2016   ALT 16 01/30/2012   AST 20 01/30/2012   NA 139 09/12/2016   K 3.9 09/12/2016   CL 104 09/12/2016   CREATININE 0.80 09/12/2016   BUN 7 09/12/2016   CO2 26 09/12/2016   TSH 4.936 (H) 01/30/2012   INR 1.22 02/04/2012   HGBA1C  02/16/2009    5.8 (NOTE) The ADA recommends the following therapeutic goal for glycemic control related to Hgb A1c measurement: Goal of therapy: <6.5 Hgb A1c  Reference: American Diabetes Association: Clinical Practice Recommendations 2010, Diabetes Care, 2010, 33: (Suppl  1).    BNP (last 3 results)  Recent Labs  09/11/16 1440  BNP 161.2*    ProBNP (last 3 results) No results for input(s): PROBNP in the last 8760 hours.   Other Studies Reviewed Today:  LHC: 09/11/16   1. Severe native three-vessel coronary artery disease with total occlusion of the RCA, total occlusion of the mid LAD, and total occlusion of the first  OM 2. Status post aortocoronary bypass surgery with continued patency of the LIMA to LAD, chronic total occlusion of the saphenous vein graft sequential to ramus and first OM, and acute total occlusion of the sequential saphenous vein graft  to PDA and PLA 3. Mild contraction abnormality of the left ventricle with inferior wall akinesis and LVEF estimated at 50% 4. Unsuccessful balloon angioplasty and aspiration thrombectomy of the saphenous vein graft to PDA and PLA with TIMI 0 flow at the completion of the procedure  Recommend: Post-MI medical therapy   Diagnostic Diagram     Post-Intervention Diagram        Echo: 09/13/16   Study Conclusions  - Left ventricle: The cavity size was normal. Systolic function was normal. The estimated ejection fraction was in the range of 55% to 60%. Wall motion was normal; there were no regional wall motion abnormalities. There was an increased relative contribution of atrial contraction to ventricular filling. Doppler parameters are consistent with abnormal left ventricular relaxation (grade 1 diastolic dysfunction). Doppler parameters are consistent with high ventricular filling pressure. - Aortic valve: Poorly visualized. There was mild to moderate stenosis. Mean gradient (S): 9 mm Hg. Valve area (VTI): 1.47 cm^2. Valve area (Vmax): 1.31 cm^2. Valve area (Vmean): 1.35 cm^2. - Mitral valve: Calcified annulus. - Atrial septum: There was increased thickness of the septum, consistent with lipomatous hypertrophy. - Pulmonary arteries: Systolic pressure could not be accurately estimated.  Assessment/Plan: 1. CAD with recent NSTEMI - s/p cardiac cath with severe native three-vessel coronary artery disease with total occlusion of the RCA, total occlusion of the mid LAD, and total occlusion of the first OM. She has had unsuccessful balloon angioplasty and aspiration thrombectomy of the saphenous vein graft to PDA and PLA with TIMI 0 flow at the completion of the procedure. She is managed medically. Unfortunately, she is having lots of symptoms with very minimal activity. Discussed with Dr. Excell Seltzer - may need to add Ranexa. Cost will be an issue - will try to  get her some patient assistance. We do not have samples in the office today. Will increase the Imdur to 60 mg a day. Increase the Coreg to 12.5 mg BID. Would like to try and get her with less symptoms. Unfortunately, her prognosis is quite tenuous.   2. Status post aortocoronary bypass surgery with continued patency of the LIMA to LAD, chronic total occlusion of the saphenous vein graft sequential to ramus and first OM, and acute total occlusion of the sequential saphenous vein graft to PDA and PLA  3. HLD - now on high dose statin therapy - she is having trouble swallowing the tablet - will try Crestor 20 mg.   4. Elevated glucose - recheck A1C  5. Elevated TSH in the past - will recheck today.   6. Obesity - not really able to exercise at this time.   7. HTN - BP not controlled - running high at home as well. Coreg and Imdur increased today.    Current medicines are reviewed with the patient today.  The patient does not have concerns regarding medicines other than what has been noted above.  The following changes have been made:  See above.  Labs/ tests ordered today include:    Orders Placed This Encounter  Procedures  . Basic metabolic panel  . CBC  . Hemoglobin A1c  . TSH  . EKG 12-Lead     Disposition:   FU with Boyce Medici, PA next Wednesday - hopeful to  get her on Ranexa at that time. Lab today. Other med changes as noted above.   Patient is agreeable to this plan and will call if any problems develop in the interim.   Signed: Rosalio Macadamia, RN, ANP-C 09/21/2016 3:52 PM  [facility] [address] [place], [place]  [address] Phone: [phone] Fax: [phone]       Addendum:  Wendall Stade, MD sent to Rosalio Macadamia, NP; Tonny Bollman, MD        Otilio Miu' seen this patient in close to 5 years might be best for her to f/u with [facility]    Dr. Excell Seltzer has agreed to follow going forward.   Rosalio Macadamia,  RN, ANP-C [facility] [address] [place], [place]  [address] [phone]   Addendum:  Judy Pimple, LPN sent to Debbe Bales; Rosalio Macadamia, NP        Ranexa 500mg  approved by [facility].    Will see back on Wednesday as planned and start Ranexa if symptoms persist.   Rosalio Macadamia, RN, [facility] [address] [place], [place]  [address] [phone]

## 2016-09-21 NOTE — Patient Instructions (Addendum)
We will be checking the following labs today - BMET, CBC, A1C and TSH   Medication Instructions:    Continue with your current medicines. BUT  I am stopping Lipitor and placing you on Crestor 20 mg a day - this should be easier to swallow  I am increasing the Imdur to 60 mg a day - you can take 2 of your 30 mg tablets daily to use up - the RX for the 60 mg is at the drug store  I am increasing the Coreg to 12.5 mg twice a day - you can take 2 of your 6.25 mg tablets twice a day to use up - the RX for the 12.5 mg is at the drug store  I am increasing the Protonix to twice a day - this has been sent to your drug store.   We are going to try and get you some assistance for Ranexa 500 mg to take twice a day - we hope to start this next week at your follow up.     Testing/Procedures To Be Arranged:  N/A  Follow-Up:   See APP next Wednesday PM   Other Special Instructions:   N/A    If you need a refill on your cardiac medications before your next appointment, please call your pharmacy.   Call the [facility] office at [phone] if you have any questions, problems or concerns.

## 2016-09-24 ENCOUNTER — Telehealth: Payer: Self-pay

## 2016-09-24 NOTE — Telephone Encounter (Signed)
Prior auth for Ranexa 500mg  obtained from [facility]. Good through 09/24/2018.

## 2016-09-26 ENCOUNTER — Encounter: Payer: Self-pay | Admitting: Cardiology

## 2016-09-26 ENCOUNTER — Encounter (INDEPENDENT_AMBULATORY_CARE_PROVIDER_SITE_OTHER): Payer: Self-pay

## 2016-09-26 ENCOUNTER — Ambulatory Visit (INDEPENDENT_AMBULATORY_CARE_PROVIDER_SITE_OTHER): Payer: Medicare Other | Admitting: Cardiology

## 2016-09-26 VITALS — BP 132/68 | Ht 59.0 in | Wt 196.0 lb

## 2016-09-26 DIAGNOSIS — I200 Unstable angina: Secondary | ICD-10-CM

## 2016-09-26 DIAGNOSIS — I214 Non-ST elevation (NSTEMI) myocardial infarction: Secondary | ICD-10-CM

## 2016-09-26 MED ORDER — CARVEDILOL 25 MG PO TABS: 25.0000 mg | ORAL_TABLET | Freq: Two times a day (BID) | ORAL | 3 refills | Status: DC

## 2016-09-26 MED ORDER — AMLODIPINE BESYLATE 5 MG PO TABS: 5.0000 mg | ORAL_TABLET | Freq: Every day | ORAL | 3 refills | Status: DC

## 2016-09-26 NOTE — Progress Notes (Signed)
09/26/2016 [name]   07/22/1951  [id number]  Primary Physician [name] Primary Cardiologist: Dr. Excell Seltzerooper   Reason for Visit/CC: f/u for CAD   HPI:  65 y/o female, with a h/o CAD s/p previous CABG. She was recently admitted to [name] for recurrent CP and ruled in for NSTEMI with troponin peaking at 6.25. LHC 09/11/16 showed severe native 3 vessel disease with total occlusion of the RCA, total occlusion of mid LAD, and total occlusion of the first OM. Also noted continued patency of the LIMA to LAD, with total occlusion of the SVG to ramus and firs OM with acute occlusion of the SVG to the PDA/PLA. Attempts were made at balloon angioplasty and aspiration thrombectomy of the SVG to PDA/PLA but were unsuccessful. EF by visual estimate was 50%. Follow up 2D echo showed EF of 55-60% with no wall motion abnormality, with G1DD. Also noted mild to moderate AS.  Given her lipid panel, she was changed from pravastatin 40mg  to atorvastatin 80mg . She also had recurrent CP and was started on Imdur.   She was seen by [name] FredricksonLori Gerhardt, [name], 09/21/16 for post hospital f/u. She was still having CP. Her Imdur was increased to 60 mg and Coreg increased to 12.5 mg BID. Lipitor was changed to Crestor due to inability to swallow large Lipitor tablets.   She presents back to clinic today with her husband to assess response to medication change and to consider initiation of Ranexa. She reports that she is doing better. She still has intermittent angina, but not as severe as before the medication adjustments. She has not been able to tolerate the higher doses of Imdur due to severe HA. She has cut back to 15 mg daily. EKG shows NSR. HR 71 bpm. BP is 132/68. QT/QTc is stable at 406/441.   Pre authorization was done for Ranexa and approved.    Current Meds  Medication Sig  . albuterol (PROVENTIL HFA;VENTOLIN HFA) 108 (90 BASE) MCG/ACT inhaler Inhale 2 puffs into the lungs every 6 (six) hours as needed. Shortness  of breath  . amLODipine (NORVASC) 5 MG tablet Take 5 mg by mouth daily.  Marland Kitchen. aspirin 81 MG tablet Take 81 mg by mouth 2 (two) times daily.  . clopidogrel (PLAVIX) 75 MG tablet Take 1 tablet (75 mg total) by mouth daily.  . Fluticasone-Salmeterol (ADVAIR) 250-50 MCG/DOSE AEPB Inhale 1 puff into the lungs every 12 (twelve) hours as needed. Shortness of breath  . furosemide (LASIX) 40 MG tablet Take 1 tablet (40 mg total) by mouth as needed (Only when needed for swelling or 3lbs weight gain for a day.).  Marland Kitchen. isosorbide mononitrate (IMDUR) 30 MG 24 hr tablet Take 15 mg by mouth daily.  Marland Kitchen. losartan (COZAAR) 100 MG tablet Take 100 mg by mouth daily.  . metaxalone (SKELAXIN) 800 MG tablet Take 800 mg by mouth 2 (two) times daily as needed for muscle spasms.   . nitroGLYCERIN (NITROSTAT) 0.4 MG SL tablet Place 0.4 mg under the tongue every 5 (five) minutes as needed for chest pain.  Marland Kitchen. oxycodone (OXY-IR) 5 MG capsule Take 5 mg by mouth every 4 (four) hours as needed for pain.   . pantoprazole (PROTONIX) 40 MG tablet Take 1 tablet (40 mg total) by mouth 2 (two) times daily.  . potassium chloride SA (K-DUR,KLOR-CON) 20 MEQ tablet Take 20 mEq by mouth as directed.  . ranitidine (ZANTAC) 75 MG tablet Take 75 mg by mouth daily. Pt states she is taking  this along with the protonix  . ranolazine (RANEXA) 500 MG 12 hr tablet Take 1 tablet (500 mg total) by mouth 2 (two) times daily.  . rosuvastatin (CRESTOR) 20 MG tablet Take 1 tablet (20 mg total) by mouth daily.   Allergies  Allergen Reactions  . Sulfonamide Derivatives Rash   Past Medical History:  Diagnosis Date  . ALLERGIC RHINITIS   . ANEMIA-IRON DEFICIENCY   . ANXIETY   . ASTHMA   . BREAST MASS, LEFT   . CAD (coronary artery disease)    1. s/p inf MI 2007 c/b low BP and brady, req temp pacer and pressors;  tx with DES x 2 to RCA;  2. inf STEMI 10/12 - PL occluded but small, med Rx recommended;  3.  NSTEMI 01/2012 - LHC with 3v CAD adn EF 45%; Echo with  mild LVH, inf HK, EF 55-605, grade 1 diast dysfxn, mild AS;  4. s/p CABG 02/04/12: L-LAD, S-RI, S-OM1, S-PD/PLVB ([facility]) 5. NSTEMI 09/11/16 patent LIMA to LAD, EF 55-60%  . COLONIC POLYPS, HX OF   . COPD   . DIVERTICULITIS, HX OF   . FATIGUE, CHRONIC   . FIBROMYALGIA   . GERD   . GLUCOSE INTOLERANCE   . HYPERLIPIDEMIA 08/23/2007  . HYPERTENSION 08/23/2007  . HYPOTHYROIDISM   . IBS   . LOW BACK PAIN   . MENOPAUSAL DISORDER   . OBESITY   . OSTEOARTHRITIS   . PERIPHERAL EDEMA   . PERIPHERAL VASCULAR DISEASE 07/15/2009  . RENAL INSUFFICIENCY 03/02/2009  . Somatization disorder 08/23/2007  . Stroke [facility])    No family history on file. Past Surgical History:  Procedure Laterality Date  . ABDOMINAL HYSTERECTOMY    . APPENDECTOMY    . CARDIAC CATHETERIZATION    . CARDIAC CATHETERIZATION N/A 09/11/2016   Procedure: Left Heart Cath and Coronary Angiography;  Surgeon: Tonny Bollman, MD;  Location: [facility] INVASIVE CV LAB;  Service: Cardiovascular;  Laterality: N/A;  . CARDIAC CATHETERIZATION N/A 09/11/2016   Procedure: Coronary Balloon Angioplasty;  Surgeon: Tonny Bollman, MD;  Location: [facility] INVASIVE CV LAB;  Service: Cardiovascular;  Laterality: N/A;  . CHOLECYSTECTOMY    . COLON SURGERY    . CORONARY ANGIOPLASTY    . CORONARY ARTERY BYPASS GRAFT  02/04/2012   Procedure: CORONARY ARTERY BYPASS GRAFTING (CABG);  Surgeon: Loreli Slot, MD;  Location: [facility] OR;  Service: Open Heart Surgery;  Laterality: N/A;  CABG x five  using left internal mammary artery and right leg greater saphenous vein harvested endoscopically  . CORONARY STENT PLACEMENT    . LEFT HEART CATHETERIZATION WITH CORONARY ANGIOGRAM N/A 01/30/2012   Procedure: LEFT HEART CATHETERIZATION WITH CORONARY ANGIOGRAM;  Surgeon: Kathleene Hazel, MD;  Location: [facility] CATH LAB;  Service: Cardiovascular;  Laterality: N/A;  . OOPHORECTOMY    . REPLACEMENT TOTAL KNEE    . TONSILLECTOMY     Social History   Social History  .  Marital status: Married    Spouse name: N/A  . Number of children: N/A  . Years of education: N/A   Occupational History  . Not on file.   Social History Main Topics  . Smoking status: Never Smoker  . Smokeless tobacco: Never Used  . Alcohol use No  . Drug use: No  . Sexual activity: Not on file   Other Topics Concern  . Not on file   Social History Narrative  . No narrative on file     Review of Systems: General: negative  for chills, fever, night sweats or weight changes.  Cardiovascular: negative for chest pain, dyspnea on exertion, edema, orthopnea, palpitations, paroxysmal nocturnal dyspnea or shortness of breath Dermatological: negative for rash Respiratory: negative for cough or wheezing Urologic: negative for hematuria Abdominal: negative for nausea, vomiting, diarrhea, bright red blood per rectum, melena, or hematemesis Neurologic: negative for visual changes, syncope, or dizziness All other systems reviewed and are otherwise negative except as noted above.   Physical Exam:  Height 4\' 11"  (1.499 m), weight 196 lb (88.9 kg).  General appearance: alert, cooperative and no distress, obese  Neck: no carotid bruit, no JVD and thyroid not enlarged, symmetric, no tenderness/mass/nodules Lungs: clear to auscultation bilaterally Heart: regular rate and rhythm, S1, S2 normal, 2/6 SM Extremities: extremities normal, atraumatic, no cyanosis or edema Pulses: 2+ and symmetric Skin: Skin color, texture, turgor normal. No rashes or lesions Neurologic: Grossly normal  EKG NSR. Qt/QTc 406/441 ms.   ASSESSMENT AND PLAN:   1. CAD: s/p recent NSTEMI. Cardiac cath with severe native three-vessel coronary artery disease with total occlusion of the RCA, total occlusion of the mid LAD, and total occlusion of the first OM. She has had unsuccessful balloon angioplasty and aspiration thrombectomy of the saphenous vein graft to PDA and PLA with TIMI 0 flow at the completion of the  procedure. She is being managed medically. Her angina is improved but she still has intermittent CP. Unable to tolerate higher doses on Imdur due to HA. Currently only tolerating low dose, 15 mg daily. Her HR is in the 70s. BP is 132/68. We will further increase her Coreg to 25 mg BID and will add Ranexa, 500 mg BID. QT/QTc is stable. Repeat EKG in 7-10 days. If still with CP, can further increase Ranexa to 1,000 mg BID.   2. HLD: now on high dose statin therapy. Crestor 20 mg. Recheck FLP in 6 weeks. If LDL not at goal, can consider PCSK9 inhibitor as this may also help with plaque regression.   3. HTN: controlled on current regimen but there is room to further increase Coreg to 25 mg BID for added antianginal benefit.   PLAN  F/u for RN visit in 7-10 days for medication monitoring (repeat EKG to assess QT/QTc for Ranexa). F/u with Dr. Excell Seltzer in 6-8 weeks.   Robbie Lis PA-C 09/26/2016 3:36 PM

## 2016-09-26 NOTE — Patient Instructions (Signed)
Medication Instructions:  Start Ranexa 500 mg Two Times Daily. You have been given a Co- pay card.  Increase Coreg to 25 mg Two Times Daily  Labwork: N/A   Testing/Procedures: EKG to be done on 10/08/16 @ 10:15am ( Only if you start Ranexa) If you do not start Ranexa call our office and cancel this appointment.   Follow-Up: 6-8 Weeks with Dr. Excell Seltzer  Any Other Special Instructions Will Be Listed Below (If Applicable).     If you need a refill on your cardiac medications before your next appointment, please call your pharmacy.

## 2016-10-01 ENCOUNTER — Telehealth: Payer: Self-pay | Admitting: Nurse Practitioner

## 2016-10-01 NOTE — Telephone Encounter (Signed)
Mr. Otilio Carpen is calling because he has some questions about the Ranexa . Please call   Thanks

## 2016-10-01 NOTE — Telephone Encounter (Signed)
Pt's husband called in today to discuss Ranexa.  There is not a DPR in the system but pt does not have a phone and husband is listed under emergency contact.   Stated pt did get approved for Ranexa but has a hard time swallowing medication and afraid this medication would be to big for pt to swallow.  Stated of could give pt samples to try.  Office does not have any samples and don't know if will get any samples in by end of year. Stated if I would call [employer] and order a couple to see if medication can be swallowed and if it works.  Stated pt has to take more than two doses of Ranexa too see if medication works.    Cannot take potassium tablets or anything larger.   Lawson Fiscal stated needs to get appointment to see Dr. Excell Seltzer.  Pt's husband stated why would pt come in to discuss medication if can't take it.  Stated would send to Julieta Gutting, RN, Dr. Earmon Phoenix nurse for further discussion. Pt's husband will  expect call from nurse.

## 2016-10-02 NOTE — Telephone Encounter (Signed)
I spoke with the pt's husband and he said the pt did not get Ranexa Rx filled because the pt will not be able to swallow this pill. I cancelled EKG visit on 10/08/16 and offered an appointment with Dr Excell Seltzer on 12/11 or 12/13.  He states the pt can only do an appointment on Friday due to transportation.  At this time we will plan to arrange follow-up on 11/09/16 with Dr Excell Seltzer but I will contact the pt closer to date with time for appointment.  Spouse agreed with plan and will contact the office if the pt has any change in symptoms.

## 2016-10-15 NOTE — Telephone Encounter (Signed)
Appointment scheduled with Dr Excell Seltzer on 11/09/16 at 1:45.  Pt's husband aware.

## 2016-10-19 ENCOUNTER — Encounter: Payer: Self-pay | Admitting: Cardiovascular Disease

## 2016-11-09 ENCOUNTER — Ambulatory Visit (INDEPENDENT_AMBULATORY_CARE_PROVIDER_SITE_OTHER): Payer: Medicare Other | Admitting: Cardiovascular Disease

## 2016-11-09 ENCOUNTER — Encounter: Payer: Self-pay | Admitting: Cardiovascular Disease

## 2016-11-09 VITALS — BP 122/86 | HR 70 | Ht 59.0 in | Wt 197.0 lb

## 2016-11-09 DIAGNOSIS — E7800 Pure hypercholesterolemia, unspecified: Secondary | ICD-10-CM

## 2016-11-09 DIAGNOSIS — I259 Chronic ischemic heart disease, unspecified: Secondary | ICD-10-CM | POA: Diagnosis not present

## 2016-11-09 MED ORDER — AMLODIPINE BESYLATE 10 MG PO TABS: 10.0000 mg | ORAL_TABLET | Freq: Every day | ORAL | 3 refills | Status: DC

## 2016-11-09 NOTE — Progress Notes (Signed)
Cardiology Office Note Date:  11/09/2016   ID:  [name], DOB 1950-11-25, MRN [id number]  PCP:  Dema Severin, NP  Cardiologist:  Tonny Bollman, MD    Chief Complaint  Patient presents with  . Coronary Artery Disease    History of Present Illness: [name] is a 66 y.o. female who presents for follow-up of CAD. The patient has a history of CABG. She presented in November 2017 with non-STEMI and ongoing ischemic symptoms, troponin elevated to 6.25 mg/dL. Cardiac catheterization demonstrated severe native vessel disease, patency of the LIMA to LAD, and occlusion of all vein grafts. The saphenous vein graft sequence to PDA/PL a appeared to be the culprit, but flow could not be reestablished despite aspiration thrombectomy and balloon angioplasty. The patient's LVEF was 50% by angiography and 55-60% by echocardiography. She was incidentally noted to have mild to moderate aortic stenosis. Her hospital stay was essentially uncomplicated.  The patient is here with her husband today. She's doing pretty well. She does have occasional chest discomfort. They note that she's had some symptoms of angina ever since her bypass surgery. She takes nitroglycerin as needed. She was not able to tolerate Ranexa because she couldn't swallow the pills. She couldn't tolerate a higher dose of isosorbide because of headache. She's doing okay on 15 mg daily. She notes that her blood pressure is occasionally elevated quite high with systolic readings around 180 mmHg. Shortness of breath with activity is unchanged. No orthopnea, PND, or leg swelling.  Past Medical History:  Diagnosis Date  . ALLERGIC RHINITIS   . ANEMIA-IRON DEFICIENCY   . ANXIETY   . ASTHMA   . BREAST MASS, LEFT   . CAD (coronary artery disease)    1. s/p inf MI 2007 c/b low BP and brady, req temp pacer and pressors;  tx with DES x 2 to RCA;  2. inf STEMI 10/12 - PL occluded but small, med Rx recommended;  3.  NSTEMI 01/2012 - LHC with 3v  CAD adn EF 45%; Echo with mild LVH, inf HK, EF 55-605, grade 1 diast dysfxn, mild AS;  4. s/p CABG 02/04/12: L-LAD, S-RI, S-OM1, S-PD/PLVB ([facility]rickson) 5. NSTEMI 09/11/16 patent LIMA to LAD, EF 55-60%  . COLONIC POLYPS, HX OF   . COPD   . DIVERTICULITIS, HX OF   . FATIGUE, CHRONIC   . FIBROMYALGIA   . GERD   . GLUCOSE INTOLERANCE   . HYPERLIPIDEMIA 08/23/2007  . HYPERTENSION 08/23/2007  . HYPOTHYROIDISM   . IBS   . LOW BACK PAIN   . MENOPAUSAL DISORDER   . OBESITY   . OSTEOARTHRITIS   . PERIPHERAL EDEMA   . PERIPHERAL VASCULAR DISEASE 07/15/2009  . RENAL INSUFFICIENCY 03/02/2009  . Somatization disorder 08/23/2007  . Stroke [facility])     Past Surgical History:  Procedure Laterality Date  . ABDOMINAL HYSTERECTOMY    . APPENDECTOMY    . CARDIAC CATHETERIZATION    . CARDIAC CATHETERIZATION N/A 09/11/2016   Procedure: Left Heart Cath and Coronary Angiography;  Surgeon: Tonny Bollman, MD;  Location: [facility] INVASIVE CV LAB;  Service: Cardiovascular;  Laterality: N/A;  . CARDIAC CATHETERIZATION N/A 09/11/2016   Procedure: Coronary Balloon Angioplasty;  Surgeon: Tonny Bollman, MD;  Location: [facility] INVASIVE CV LAB;  Service: Cardiovascular;  Laterality: N/A;  . CHOLECYSTECTOMY    . COLON SURGERY    . CORONARY ANGIOPLASTY    . CORONARY ARTERY BYPASS GRAFT  02/04/2012   Procedure: CORONARY ARTERY BYPASS GRAFTING (CABG);  Surgeon: Loreli Slot, MD;  Location: [facility] OR;  Service: Open Heart Surgery;  Laterality: N/A;  CABG x five  using left internal mammary artery and right leg greater saphenous vein harvested endoscopically  . CORONARY STENT PLACEMENT    . LEFT HEART CATHETERIZATION WITH CORONARY ANGIOGRAM N/A 01/30/2012   Procedure: LEFT HEART CATHETERIZATION WITH CORONARY ANGIOGRAM;  Surgeon: Kathleene Hazel, MD;  Location: [facility] CATH LAB;  Service: Cardiovascular;  Laterality: N/A;  . OOPHORECTOMY    . REPLACEMENT TOTAL KNEE    . TONSILLECTOMY      Current Outpatient Prescriptions    Medication Sig Dispense Refill  . albuterol (PROVENTIL HFA;VENTOLIN HFA) 108 (90 BASE) MCG/ACT inhaler Inhale 2 puffs into the lungs every 6 (six) hours as needed. Shortness of breath    . amLODipine (NORVASC) 5 MG tablet Take 1 tablet (5 mg total) by mouth daily. 90 tablet 3  . aspirin 81 MG tablet Take 81 mg by mouth 2 (two) times daily.    . carvedilol (COREG) 25 MG tablet Take 1 tablet (25 mg total) by mouth 2 (two) times daily with a meal. 180 tablet 3  . clopidogrel (PLAVIX) 75 MG tablet Take 1 tablet (75 mg total) by mouth daily. 90 tablet 3  . Fluticasone-Salmeterol (ADVAIR) 250-50 MCG/DOSE AEPB Inhale 1 puff into the lungs every 12 (twelve) hours as needed. Shortness of breath    . furosemide (LASIX) 40 MG tablet Take 40 mg by mouth daily as needed for fluid or edema (only when needed for swelling or 3lbs weight gain for a day).    . isosorbide mononitrate (IMDUR) 30 MG 24 hr tablet Take 15 mg by mouth daily.    Marland Kitchen levothyroxine (SYNTHROID, LEVOTHROID) 125 MCG tablet Take 125 mcg by mouth daily before breakfast.    . losartan (COZAAR) 100 MG tablet Take 100 mg by mouth daily.    . metaxalone (SKELAXIN) 800 MG tablet Take 800 mg by mouth 2 (two) times daily as needed for muscle spasms.     . nitroGLYCERIN (NITROSTAT) 0.4 MG SL tablet Place 0.4 mg under the tongue every 5 (five) minutes as needed for chest pain.    Marland Kitchen oxycodone (OXY-IR) 5 MG capsule Take 5 mg by mouth every 4 (four) hours as needed for pain.     . pantoprazole (PROTONIX) 40 MG tablet Take 1 tablet (40 mg total) by mouth 2 (two) times daily. 60 tablet 6  . potassium chloride SA (K-DUR,KLOR-CON) 20 MEQ tablet Take 20 mEq by mouth as directed.    . ranitidine (ZANTAC) 75 MG tablet Take 75 mg by mouth daily. Pt states she is taking this along with the protonix    . ranolazine (RANEXA) 500 MG 12 hr tablet Take 1 tablet (500 mg total) by mouth 2 (two) times daily. 60 tablet 6  . rosuvastatin (CRESTOR) 20 MG tablet Take 1 tablet  (20 mg total) by mouth daily. 90 tablet 3   No current facility-administered medications for this visit.     Allergies:   Sulfonamide derivatives   Social History:  The patient  reports that she has never smoked. She has never used smokeless tobacco. She reports that she does not drink alcohol or use drugs.   Family History:  The patient's  family history is not on file.    ROS:  Please see the history of present illness.  Otherwise, review of systems is positive for Diarrhea, back pain, leg pain.  All other systems are reviewed and  negative.    PHYSICAL EXAM: VS:  BP 122/86   Pulse 70   Ht 4\' 11"  (1.499 m)   Wt 197 lb (89.4 kg)   BMI 39.79 kg/m  , BMI Body mass index is 39.79 kg/m. GEN: pleasant obese woman, in no acute distress  HEENT: normal  Neck: no JVD, no masses. No carotid bruits Cardiac: RRR without murmur or gallop                Respiratory:  clear to auscultation bilaterally, normal work of breathing GI: soft, nontender, nondistended, + BS MS: no deformity or atrophy  Ext: no pretibial edema, pedal pulses 2+= bilaterally Skin: warm and dry, no rash Neuro:  Strength and sensation are intact Psych: euthymic mood, full affect  EKG:  EKG is not ordered today.  Recent Labs: 09/11/2016: B Natriuretic Peptide 161.2 09/21/2016: BUN 9; Creat 0.82; Hemoglobin 13.0; Platelets 329; Potassium 4.3; Sodium 141; TSH 1.69   Lipid Panel     Component Value Date/Time   CHOL 231 (H) 09/12/2016 0256   TRIG 279 (H) 09/12/2016 0256   HDL 36 (L) 09/12/2016 0256   CHOLHDL 6.4 09/12/2016 0256   VLDL 56 (H) 09/12/2016 0256   LDLCALC 139 (H) 09/12/2016 0256   LDLDIRECT 168.9 08/22/2010 0840      Wt Readings from Last 3 Encounters:  11/09/16 197 lb (89.4 kg)  09/26/16 196 lb (88.9 kg)  09/21/16 196 lb 12.8 oz (89.3 kg)     Cardiac Studies Reviewed: 2D Echo 09-13-2016: Study Conclusions  - Left ventricle: The cavity size was normal. Systolic function was   normal. The  estimated ejection fraction was in the range of 55%   to 60%. Wall motion was normal; there were no regional wall   motion abnormalities. There was an increased relative   contribution of atrial contraction to ventricular filling.   Doppler parameters are consistent with abnormal left ventricular   relaxation (grade 1 diastolic dysfunction). Doppler parameters   are consistent with high ventricular filling pressure. - Aortic valve: Poorly visualized. There was mild to moderate   stenosis. Mean gradient (S): 9 mm Hg. Valve area (VTI): 1.47   cm^2. Valve area (Vmax): 1.31 cm^2. Valve area (Vmean): 1.35   cm^2. - Mitral valve: Calcified annulus. - Atrial septum: There was increased thickness of the septum,   consistent with lipomatous hypertrophy. - Pulmonary arteries: Systolic pressure could not be accurately   estimated.  Cardiac Cath 09-11-2016: Procedures   Coronary Balloon Angioplasty  Left Heart Cath and Coronary Angiography  Conclusion   1. Severe native three-vessel coronary artery disease with total occlusion of the RCA, total occlusion of the mid LAD, and total occlusion of the first OM 2. Status post aortocoronary bypass surgery with continued patency of the LIMA to LAD, chronic total occlusion of the saphenous vein graft sequential to ramus and first OM, and acute total occlusion of the sequential saphenous vein graft to PDA and PLA 3. Mild contraction abnormality of the left ventricle with inferior wall akinesis and LVEF estimated at 50% 4. Unsuccessful balloon angioplasty and aspiration thrombectomy of the saphenous vein graft to PDA and PLA with TIMI 0 flow at the completion of the procedure  Recommend: Post-MI medical therapy    ASSESSMENT AND PLAN: 1.  CAD, native vessel and bypass graft disease, with chronic angina: The patient's symptoms are stable. She's on a good antianginal regimen and will increase amlodipine to 10 mg daily. She cannot tolerate an increase in her  isosorbide because of side effects.  2. Hyperlipidemia: Continue Crestor 20 mg daily. The patient is due for lipids and LFTs. Will schedule. Her baseline cholesterol from the time of her MI is reviewed but she has not had any follow-up labs.  3. Hypertension: Elevated blood pressures at times. Increase amlodipine.  Current medicines are reviewed with the patient today.  The patient does not have concerns regarding medicines.  Labs/ tests ordered today include:  No orders of the defined types were placed in this encounter.   Disposition:   FU 6 months  Signed, Tonny Bollman, MD  11/09/2016 2:27 PM    [facility] [address] [place], [place], [place]  [address] Phone: [phone]; Fax: [phone]

## 2016-11-09 NOTE — Patient Instructions (Signed)
Medication Instructions:  Your physician has recommended you make the following change in your medication:  1. INCREASE Amlodipine to 10mg  take one tablet by mouth daily  Labwork: Your physician recommends that you return for a FASTING LIPID and LIVER--nothing to eat or drink after midnight, lab opens at 7:30 AM  Testing/Procedures: No new orders.   Follow-Up: Your physician wants you to follow-up in: 6 MONTHS with Dr Excell Seltzer.  You will receive a reminder letter in the mail two months in advance. If you don't receive a letter, please call our office to schedule the follow-up appointment.   Any Other Special Instructions Will Be Listed Below (If Applicable).     If you need a refill on your cardiac medications before your next appointment, please call your pharmacy.

## 2016-11-16 ENCOUNTER — Other Ambulatory Visit: Payer: Medicare Other

## 2016-11-16 ENCOUNTER — Other Ambulatory Visit: Payer: Medicare Other | Admitting: *Deleted

## 2016-11-16 DIAGNOSIS — E7800 Pure hypercholesterolemia, unspecified: Secondary | ICD-10-CM

## 2016-11-16 DIAGNOSIS — I10 Essential (primary) hypertension: Secondary | ICD-10-CM

## 2016-11-16 NOTE — Addendum Note (Signed)
Addended by: Tonita Phoenix on: 11/16/2016 11:47 AM   Modules accepted: Orders

## 2016-11-17 LAB — HEPATIC FUNCTION PANEL
ALBUMIN: 4 g/dL (ref 3.6–4.8)
ALK PHOS: 75 IU/L (ref 39–117)
ALT: 10 IU/L (ref 0–32)
AST: 15 IU/L (ref 0–40)
BILIRUBIN TOTAL: 0.3 mg/dL (ref 0.0–1.2)
Bilirubin, Direct: 0.09 mg/dL (ref 0.00–0.40)
TOTAL PROTEIN: 6.8 g/dL (ref 6.0–8.5)

## 2016-11-17 LAB — LIPID PANEL
CHOLESTEROL TOTAL: 171 mg/dL (ref 100–199)
Chol/HDL Ratio: 4 ratio units (ref 0.0–4.4)
HDL: 43 mg/dL (ref >39)
LDL Calculated: 90 mg/dL (ref 0–99)
Triglycerides: 189 mg/dL — ABNORMAL HIGH (ref 0–149)
VLDL CHOLESTEROL CAL: 38 mg/dL (ref 5–40)

## 2017-08-05 ENCOUNTER — Ambulatory Visit: Payer: Medicare Other | Admitting: Cardiovascular Disease

## 2017-09-12 ENCOUNTER — Ambulatory Visit (INDEPENDENT_AMBULATORY_CARE_PROVIDER_SITE_OTHER): Payer: Medicare Other | Admitting: Cardiovascular Disease

## 2017-09-12 ENCOUNTER — Encounter: Payer: Self-pay | Admitting: Cardiovascular Disease

## 2017-09-12 VITALS — BP 118/66 | HR 72 | Ht 60.0 in | Wt 199.8 lb

## 2017-09-12 DIAGNOSIS — I10 Essential (primary) hypertension: Secondary | ICD-10-CM | POA: Diagnosis not present

## 2017-09-12 DIAGNOSIS — I350 Nonrheumatic aortic (valve) stenosis: Secondary | ICD-10-CM | POA: Diagnosis not present

## 2017-09-12 DIAGNOSIS — I2510 Atherosclerotic heart disease of native coronary artery without angina pectoris: Secondary | ICD-10-CM | POA: Diagnosis not present

## 2017-09-12 DIAGNOSIS — E785 Hyperlipidemia, unspecified: Secondary | ICD-10-CM | POA: Diagnosis not present

## 2017-09-12 MED ORDER — ASPIRIN 81 MG PO TABS: 81.0000 mg | ORAL_TABLET | Freq: Every day | ORAL | Status: AC

## 2017-09-12 NOTE — Progress Notes (Signed)
Cardiology Office Note Date:  09/14/2017   ID:  [name], DOB 07/16/1951, MRN [id number]  PCP:  Dema SeverinYork, Regina F, NP  Cardiologist:  Tonny Bollmanooper, Annaleah Arata, MD    Chief Complaint  Patient presents with  . Chest Pain   History of Present Illness: [name] is a 66 y.o. female who presents for follow-up evaluation.  The patient is followed for coronary artery disease.  She presented with non-STEMI in November 2017 and cardiac cath showed occlusion of her vein grafts with continued patency of the LIMA to LAD.  Her LVEF was 50% by left ventriculography and 55-60% by echo.  She also was noted to have mild to moderate aortic stenosis.  Patient is here with her husband.  She has had chronic for many years.  She continues to have chest pain on a regular basis but no change in pattern over the past 6 months.  She has multiple different types of chest pain.  Her anginal pain is typically in the substernal region and resolves with rest.  She is also limited by shortness of breath.  She has had problems with her back and she leads a sedentary lifestyle.  She ambulates with a cane.  She denies orthopnea, PND, or heart palpitations.  She is short of breath with activity at fairly low level.   Past Medical History:  Diagnosis Date  . ALLERGIC RHINITIS   . ANEMIA-IRON DEFICIENCY   . ANXIETY   . ASTHMA   . BREAST MASS, LEFT   . CAD (coronary artery disease)    1. s/p inf MI 2007 c/b low BP and brady, req temp pacer and pressors;  tx with DES x 2 to RCA;  2. inf STEMI 10/12 - PL occluded but small, med Rx recommended;  3.  NSTEMI 01/2012 - LHC with 3v CAD adn EF 45%; Echo with mild LVH, inf HK, EF 55-605, grade 1 diast dysfxn, mild AS;  4. s/p CABG 02/04/12: L-LAD, S-RI, S-OM1, S-PD/PLVB ([facility]) 5. NSTEMI 09/11/16 patent LIMA to LAD, EF 55-60%  . COLONIC POLYPS, HX OF   . COPD   . DIVERTICULITIS, HX OF   . FATIGUE, CHRONIC   . FIBROMYALGIA   . GERD   . GLUCOSE INTOLERANCE   . HYPERLIPIDEMIA  08/23/2007  . HYPERTENSION 08/23/2007  . HYPOTHYROIDISM   . IBS   . LOW BACK PAIN   . MENOPAUSAL DISORDER   . OBESITY   . OSTEOARTHRITIS   . PERIPHERAL EDEMA   . PERIPHERAL VASCULAR DISEASE 07/15/2009  . RENAL INSUFFICIENCY 03/02/2009  . Somatization disorder 08/23/2007  . Stroke [facility](HCC)     Past Surgical History:  Procedure Laterality Date  . ABDOMINAL HYSTERECTOMY    . APPENDECTOMY    . CARDIAC CATHETERIZATION    . CHOLECYSTECTOMY    . COLON SURGERY    . CORONARY ANGIOPLASTY    . CORONARY STENT PLACEMENT    . OOPHORECTOMY    . REPLACEMENT TOTAL KNEE    . TONSILLECTOMY      Current Outpatient Medications  Medication Sig Dispense Refill  . albuterol (PROVENTIL HFA;VENTOLIN HFA) 108 (90 BASE) MCG/ACT inhaler Inhale 2 puffs into the lungs every 6 (six) hours as needed. Shortness of breath    . amLODipine (NORVASC) 10 MG tablet Take 1 tablet (10 mg total) by mouth daily. 90 tablet 3  . aspirin 81 MG tablet Take 1 tablet (81 mg total) daily by mouth. 30 tablet   . carvedilol (COREG) 25 MG tablet  Take 25 mg 2 (two) times daily with a meal by mouth.    . clopidogrel (PLAVIX) 75 MG tablet Take 1 tablet (75 mg total) by mouth daily. 90 tablet 3  . Fluticasone-Salmeterol (ADVAIR) 250-50 MCG/DOSE AEPB Inhale 1 puff into the lungs every 12 (twelve) hours as needed. Shortness of breath    . furosemide (LASIX) 40 MG tablet Take 40 mg by mouth daily as needed for fluid or edema (only when needed for swelling or 3lbs weight gain for a day).    . isosorbide mononitrate (IMDUR) 30 MG 24 hr tablet Take 15 mg by mouth daily.    Marland Kitchen levothyroxine (SYNTHROID, LEVOTHROID) 125 MCG tablet Take 125 mcg by mouth daily before breakfast.    . loratadine (CLARITIN) 10 MG tablet Take 10 mg daily by mouth.    . losartan (COZAAR) 100 MG tablet Take 100 mg by mouth daily.    . metaxalone (SKELAXIN) 800 MG tablet Take 800 mg by mouth 2 (two) times daily as needed for muscle spasms.     . nitroGLYCERIN (NITROSTAT)  0.4 MG SL tablet Place 0.4 mg under the tongue every 5 (five) minutes as needed for chest pain.    Marland Kitchen nystatin (MYCOSTATIN/NYSTOP) powder Apply 1 application 4 (four) times daily as needed topically. Skin irritation    . oxycodone (OXY-IR) 5 MG capsule Take 5 mg by mouth every 4 (four) hours as needed for pain.     . pantoprazole (PROTONIX) 40 MG tablet Take 1 tablet (40 mg total) by mouth 2 (two) times daily. 60 tablet 6  . potassium chloride SA (K-DUR,KLOR-CON) 20 MEQ tablet Take 20 mEq by mouth as directed.    . ranitidine (ZANTAC) 75 MG tablet Take 75 mg by mouth daily. Pt states she is taking this along with the protonix    . traMADol (ULTRAM) 50 MG tablet Take 50 mg 2 (two) times daily as needed by mouth. Headache    . rosuvastatin (CRESTOR) 20 MG tablet Take 1 tablet (20 mg total) by mouth daily. 90 tablet 3   No current facility-administered medications for this visit.     Allergies:   Sulfonamide derivatives   Social History:  The patient  reports that  has never smoked. she has never used smokeless tobacco. She reports that she does not drink alcohol or use drugs.   Family History:  The patient's family history is not on file.    ROS:  Please see the history of present illness.  Otherwise, review of systems is positive for chills, chest pain, exertional dyspnea, back pain, muscle pain, fever, headache.  All other systems are reviewed and negative.    PHYSICAL EXAM: VS:  BP 118/66   Pulse 72   Ht 5' (1.524 m)   Wt 199 lb 12.8 oz (90.6 kg)   BMI 39.02 kg/m  , BMI Body mass index is 39.02 kg/m. GEN: Well nourished, well developed, obese woman in no acute distress  HEENT: normal  Neck: no JVD, no masses. No carotid bruits Cardiac: RRR with grade 3/6 systolic murmur at the right upper sternal border, no diastolic murmur, murmur is mid peaking.               Respiratory:  clear to auscultation bilaterally, normal work of breathing GI: soft, nontender, nondistended, + BS MS: no  deformity or atrophy  Ext: no pretibial edema, pedal pulses 2+= bilaterally Skin: warm and dry, no rash Neuro:  Strength and sensation are intact Psych: euthymic mood,  full affect  EKG:  EKG is ordered today. The ekg ordered today shows normal sinus rhythm 72 bpm, inferior infarct age undetermined.  Recent Labs: 09/21/2016: BUN 9; Creat 0.82; Hemoglobin 13.0; Platelets 329; Potassium 4.3; Sodium 141; TSH 1.69 11/16/2016: ALT 10   Lipid Panel     Component Value Date/Time   CHOL 171 11/16/2016 1147   TRIG 189 (H) 11/16/2016 1147   HDL 43 11/16/2016 1147   CHOLHDL 4.0 11/16/2016 1147   CHOLHDL 6.4 09/12/2016 0256   VLDL 56 (H) 09/12/2016 0256   LDLCALC 90 11/16/2016 1147   LDLDIRECT 168.9 08/22/2010 0840      Wt Readings from Last 3 Encounters:  09/12/17 199 lb 12.8 oz (90.6 kg)  11/09/16 197 lb (89.4 kg)  09/26/16 196 lb (88.9 kg)     Cardiac Studies Reviewed: Echo 09-13-2016: Study Conclusions  - Left ventricle: The cavity size was normal. Systolic function was   normal. The estimated ejection fraction was in the range of 55%   to 60%. Wall motion was normal; there were no regional wall   motion abnormalities. There was an increased relative   contribution of atrial contraction to ventricular filling.   Doppler parameters are consistent with abnormal left ventricular   relaxation (grade 1 diastolic dysfunction). Doppler parameters   are consistent with high ventricular filling pressure. - Aortic valve: Poorly visualized. There was mild to moderate   stenosis. Mean gradient (S): 9 mm Hg. Valve area (VTI): 1.47   cm^2. Valve area (Vmax): 1.31 cm^2. Valve area (Vmean): 1.35   cm^2. - Mitral valve: Calcified annulus. - Atrial septum: There was increased thickness of the septum,   consistent with lipomatous hypertrophy. - Pulmonary arteries: Systolic pressure could not be accurately   estimated.  ASSESSMENT AND PLAN: 1.  CAD, native vessel, the patient will continue  on a combination of pain, carvedilol, and isosorbide.  She was unable to swallow Ranexa pills.  Her anginal symptoms are stable.  She will continue on long-term dual antiplatelet therapy with aspirin and clopidogrel unless bleeding issues arise.  2.  Hypertension: Blood pressure is well controlled on multidrug antihypertensive therapy.  3.  Chronic combined systolic and diastolic heart failure: We will update an echocardiogram.  Patient has New York Heart Association functional class III symptoms which certainly are multifactorial and related to chronic obstructive pulmonary disease, congestive heart failure and obesity/deconditioning  4.  Hyperlipidemia: The patient is treated with a high intensity statin using Crestor 20 mg daily  5.  Moderate aortic stenosis: Reviewed natural history of aortic stenosis with the patient and potential association of symptoms including shortness of breath or chest discomfort.  Will update her echocardiogram as it is been 1 year since her previous study.  Current medicines are reviewed with the patient today.  The patient does not have concerns regarding medicines.  Labs/ tests ordered today include:   Orders Placed This Encounter  Procedures  . EKG 12-Lead  . ECHOCARDIOGRAM COMPLETE    Disposition:   FU 6 months  Signed, Tonny Bollman, MD  09/14/2017 6:55 AM    [facility] [address] [place], [place], [place]  [address] Phone: [phone]; Fax: [phone]

## 2017-09-12 NOTE — Patient Instructions (Signed)
Medication Instructions:  Your provider recommends that you continue on your current medications as directed. Please refer to the Current Medication list given to you today.    Labwork: None  Testing/Procedures: Your provider has requested that you have an echocardiogram. Echocardiography is a painless test that uses sound waves to create images of your heart. It provides your doctor with information about the size and shape of your heart and how well your heart's chambers and valves are working. This procedure takes approximately one hour. There are no restrictions for this procedure.  Follow-Up: Your provider wants you to follow-up in: 6 months with Dr. Cooper. You will receive a reminder letter in the mail two months in advance. If you don't receive a letter, please call our office to schedule the follow-up appointment.    Any Other Special Instructions Will Be Listed Below (If Applicable).     If you need a refill on your cardiac medications before your next appointment, please call your pharmacy.   

## 2017-09-14 ENCOUNTER — Encounter: Payer: Self-pay | Admitting: Cardiovascular Disease

## 2017-09-20 ENCOUNTER — Other Ambulatory Visit: Payer: Self-pay | Admitting: Cardiology

## 2017-09-20 ENCOUNTER — Other Ambulatory Visit: Payer: Self-pay

## 2017-09-20 ENCOUNTER — Ambulatory Visit (HOSPITAL_COMMUNITY): Payer: Medicare Other | Attending: Cardiology

## 2017-09-20 DIAGNOSIS — I2510 Atherosclerotic heart disease of native coronary artery without angina pectoris: Secondary | ICD-10-CM | POA: Diagnosis not present

## 2017-09-20 DIAGNOSIS — M545 Low back pain: Secondary | ICD-10-CM | POA: Insufficient documentation

## 2017-09-20 DIAGNOSIS — I10 Essential (primary) hypertension: Secondary | ICD-10-CM | POA: Diagnosis not present

## 2017-09-20 DIAGNOSIS — I519 Heart disease, unspecified: Secondary | ICD-10-CM | POA: Diagnosis not present

## 2017-09-20 DIAGNOSIS — J449 Chronic obstructive pulmonary disease, unspecified: Secondary | ICD-10-CM | POA: Insufficient documentation

## 2017-09-20 DIAGNOSIS — E785 Hyperlipidemia, unspecified: Secondary | ICD-10-CM | POA: Diagnosis not present

## 2017-09-20 DIAGNOSIS — R079 Chest pain, unspecified: Secondary | ICD-10-CM | POA: Diagnosis not present

## 2017-09-20 DIAGNOSIS — I252 Old myocardial infarction: Secondary | ICD-10-CM | POA: Insufficient documentation

## 2017-09-20 DIAGNOSIS — I350 Nonrheumatic aortic (valve) stenosis: Secondary | ICD-10-CM | POA: Diagnosis present

## 2017-09-20 DIAGNOSIS — M797 Fibromyalgia: Secondary | ICD-10-CM | POA: Insufficient documentation

## 2017-09-20 DIAGNOSIS — Z8673 Personal history of transient ischemic attack (TIA), and cerebral infarction without residual deficits: Secondary | ICD-10-CM | POA: Diagnosis not present

## 2017-09-20 DIAGNOSIS — E669 Obesity, unspecified: Secondary | ICD-10-CM | POA: Diagnosis not present

## 2017-09-23 ENCOUNTER — Telehealth: Payer: Self-pay | Admitting: *Deleted

## 2017-09-23 DIAGNOSIS — I350 Nonrheumatic aortic (valve) stenosis: Secondary | ICD-10-CM

## 2017-09-23 NOTE — Telephone Encounter (Signed)
DPR on file ok to s/w pt's husband. Pt husband has been notified of pt's echo results and findings by phone with verbal understanding. Advised per Dr. Excell Seltzer to repeat echo in 1 yr. Pt's husband is agreeable to plan of care for the pt and thanked me for my call today.

## 2017-09-23 NOTE — Telephone Encounter (Signed)
-----   Message from Tonny Bollman, MD sent at 09/22/2017  9:45 AM EST ----- Stable, moderate aortic stenosis. Continue annual FU. LV function preserved.

## 2017-10-05 ENCOUNTER — Other Ambulatory Visit: Payer: Self-pay | Admitting: Nurse Practitioner

## 2017-10-11 ENCOUNTER — Other Ambulatory Visit: Payer: Self-pay | Admitting: Nurse Practitioner

## 2017-10-23 ENCOUNTER — Other Ambulatory Visit: Payer: Self-pay | Admitting: Nurse Practitioner

## 2017-12-05 ENCOUNTER — Other Ambulatory Visit: Payer: Self-pay | Admitting: Cardiology

## 2017-12-06 NOTE — Telephone Encounter (Signed)
REFILL 

## 2018-01-24 ENCOUNTER — Other Ambulatory Visit: Payer: Self-pay | Admitting: Cardiovascular Disease

## 2018-02-22 ENCOUNTER — Encounter (HOSPITAL_BASED_OUTPATIENT_CLINIC_OR_DEPARTMENT_OTHER): Payer: Self-pay | Admitting: Emergency Medicine

## 2018-02-22 ENCOUNTER — Emergency Department (HOSPITAL_BASED_OUTPATIENT_CLINIC_OR_DEPARTMENT_OTHER): Payer: Medicare Other

## 2018-02-22 ENCOUNTER — Emergency Department (HOSPITAL_BASED_OUTPATIENT_CLINIC_OR_DEPARTMENT_OTHER)
Admission: EM | Admit: 2018-02-22 | Discharge: 2018-02-22 | Disposition: A | Discharge status: Home / Self Care | Payer: Medicare Other | Attending: Emergency Medicine | Admitting: Emergency Medicine

## 2018-02-22 ENCOUNTER — Other Ambulatory Visit: Payer: Self-pay

## 2018-02-22 DIAGNOSIS — Z23 Encounter for immunization: Secondary | ICD-10-CM | POA: Diagnosis not present

## 2018-02-22 DIAGNOSIS — Z951 Presence of aortocoronary bypass graft: Secondary | ICD-10-CM | POA: Diagnosis not present

## 2018-02-22 DIAGNOSIS — I10 Essential (primary) hypertension: Secondary | ICD-10-CM | POA: Insufficient documentation

## 2018-02-22 DIAGNOSIS — M79642 Pain in left hand: Secondary | ICD-10-CM | POA: Diagnosis present

## 2018-02-22 DIAGNOSIS — Z79899 Other long term (current) drug therapy: Secondary | ICD-10-CM | POA: Diagnosis not present

## 2018-02-22 DIAGNOSIS — I252 Old myocardial infarction: Secondary | ICD-10-CM | POA: Diagnosis not present

## 2018-02-22 DIAGNOSIS — E039 Hypothyroidism, unspecified: Secondary | ICD-10-CM | POA: Insufficient documentation

## 2018-02-22 DIAGNOSIS — Z7982 Long term (current) use of aspirin: Secondary | ICD-10-CM | POA: Diagnosis not present

## 2018-02-22 DIAGNOSIS — L03114 Cellulitis of left upper limb: Secondary | ICD-10-CM | POA: Diagnosis not present

## 2018-02-22 MED ORDER — CEPHALEXIN 250 MG PO CAPS
1000.0000 mg | ORAL_CAPSULE | Freq: Once | ORAL | Status: AC
  Administered 2018-02-22: 1000 mg via ORAL
  Filled 2018-02-22: qty 4

## 2018-02-22 MED ORDER — IBUPROFEN 400 MG PO TABS
400.0000 mg | ORAL_TABLET | Freq: Once | ORAL | Status: AC
  Administered 2018-02-22: 400 mg via ORAL
  Filled 2018-02-22: qty 1

## 2018-02-22 MED ORDER — TETANUS-DIPHTH-ACELL PERTUSSIS 5-2.5-18.5 LF-MCG/0.5 IM SUSP
0.5000 mL | Freq: Once | INTRAMUSCULAR | Status: AC
  Administered 2018-02-22: 0.5 mL via INTRAMUSCULAR
  Filled 2018-02-22: qty 0.5

## 2018-02-22 MED ORDER — CEPHALEXIN 500 MG PO CAPS: 500.0000 mg | ORAL_CAPSULE | Freq: Four times a day (QID) | ORAL | 0 refills | Status: DC

## 2018-02-22 NOTE — Discharge Instructions (Signed)
It was our pleasure to provide your ER care today - we hope that you feel better.  Take keflex (antibiotic) as prescribed.  Take acetaminophen or ibuprofen as need.  Keep area very clean.  Return to ER if worse, spreading redness, severe pain/swelling, other concern.

## 2018-02-22 NOTE — ED Triage Notes (Signed)
Patient states that she was cutting wood 2 -3 days ago and feels like there may be a FB in it, or she broke it. Noted swelling and redness to her left hand

## 2018-02-22 NOTE — ED Provider Notes (Signed)
[facility] EMERGENCY DEPARTMENT Provider Note   CSN: [id number] Arrival date & time: 02/22/18  1324     History   Chief Complaint Chief Complaint  Patient presents with  . Hand Pain    HPI [name] is a 67 y.o. female.  Patient c/o swelling and redness to dorsum of left hand and wrist for the past 1-2 days. Symptoms gradual onset, persistent/constant, slowly worse. States 3 days ago was cutting wood, and a piece of branch poked through her skin, she was able to pull it out. Denies fever or chills. Does not feel sick or ill. No numbness/tingling. Normal movement of hand and fingers. Tetanus - pt unsure.   The history is provided by the patient.  Hand Pain     Past Medical History:  Diagnosis Date  . ALLERGIC RHINITIS   . ANEMIA-IRON DEFICIENCY   . ANXIETY   . ASTHMA   . BREAST MASS, LEFT   . CAD (coronary artery disease)    1. s/p inf MI 2007 c/b low BP and brady, req temp pacer and pressors;  tx with DES x 2 to RCA;  2. inf STEMI 10/12 - PL occluded but small, med Rx recommended;  3.  NSTEMI 01/2012 - LHC with 3v CAD adn EF 45%; Echo with mild LVH, inf HK, EF 55-605, grade 1 diast dysfxn, mild AS;  4. s/p CABG 02/04/12: L-LAD, S-RI, S-OM1, S-PD/PLVB ([facility]) 5. NSTEMI 09/11/16 patent LIMA to LAD, EF 55-60%  . COLONIC POLYPS, HX OF   . COPD   . DIVERTICULITIS, HX OF   . FATIGUE, CHRONIC   . FIBROMYALGIA   . GERD   . GLUCOSE INTOLERANCE   . HYPERLIPIDEMIA 08/23/2007  . HYPERTENSION 08/23/2007  . HYPOTHYROIDISM   . IBS   . LOW BACK PAIN   . MENOPAUSAL DISORDER   . OBESITY   . OSTEOARTHRITIS   . PERIPHERAL EDEMA   . PERIPHERAL VASCULAR DISEASE 07/15/2009  . RENAL INSUFFICIENCY 03/02/2009  . Somatization disorder 08/23/2007  . Stroke [facility])     Patient Active Problem List   Diagnosis Date Noted  . S/P CABG x 5 02/08/2012  . CAD (coronary artery disease) 02/01/2012  . NSTEMI (non-ST elevated myocardial infarction) (HCC) 02/01/2012  . PERIPHERAL  EDEMA 08/28/2010  . BREAST MASS, LEFT 08/19/2009  . PERIPHERAL VASCULAR DISEASE 07/15/2009  . MENOPAUSAL DISORDER 07/15/2009  . SHORTNESS OF BREATH 03/03/2009  . HYPOKALEMIA 03/02/2009  . RENAL INSUFFICIENCY 03/02/2009  . GLUCOSE INTOLERANCE 06/07/2008  . ANEMIA-IRON DEFICIENCY 06/07/2008  . ALLERGIC RHINITIS 06/07/2008  . GERD 06/07/2008  . FATIGUE, CHRONIC 06/07/2008  . CHEST PAIN 06/07/2008  . COLONIC POLYPS, HX OF 06/07/2008  . DIVERTICULITIS, HX OF 06/07/2008  . HYPOTHYROIDISM 08/23/2007  . Hyperlipidemia 08/23/2007  . OBESITY 08/23/2007  . ANXIETY 08/23/2007  . SOMATIZATION DISORDER 08/23/2007  . Essential hypertension 08/23/2007  . MYOCARDIAL INFARCTION, HX OF 08/23/2007  . Coronary atherosclerosis 08/23/2007  . ASTHMA 08/23/2007  . COPD 08/23/2007  . IBS 08/23/2007  . OSTEOARTHRITIS 08/23/2007  . LOW BACK PAIN 08/23/2007  . FIBROMYALGIA 08/23/2007    Past Surgical History:  Procedure Laterality Date  . ABDOMINAL HYSTERECTOMY    . APPENDECTOMY    . CARDIAC CATHETERIZATION    . CARDIAC CATHETERIZATION N/A 09/11/2016   Procedure: Left Heart Cath and Coronary Angiography;  Surgeon: Tonny Bollman, MD;  Location: [facility] INVASIVE CV LAB;  Service: Cardiovascular;  Laterality: N/A;  . CARDIAC CATHETERIZATION N/A 09/11/2016   Procedure: Coronary Balloon  Angioplasty;  Surgeon: Tonny Bollman, MD;  Location: [facility] INVASIVE CV LAB;  Service: Cardiovascular;  Laterality: N/A;  . CHOLECYSTECTOMY    . COLON SURGERY    . CORONARY ANGIOPLASTY    . CORONARY ARTERY BYPASS GRAFT  02/04/2012   Procedure: CORONARY ARTERY BYPASS GRAFTING (CABG);  Surgeon: Loreli Slot, MD;  Location: [facility] OR;  Service: Open Heart Surgery;  Laterality: N/A;  CABG x five  using left internal mammary artery and right leg greater saphenous vein harvested endoscopically  . CORONARY STENT PLACEMENT    . LEFT HEART CATHETERIZATION WITH CORONARY ANGIOGRAM N/A 01/30/2012   Procedure: LEFT HEART CATHETERIZATION WITH  CORONARY ANGIOGRAM;  Surgeon: Kathleene Hazel, MD;  Location: [facility] CATH LAB;  Service: Cardiovascular;  Laterality: N/A;  . OOPHORECTOMY    . REPLACEMENT TOTAL KNEE    . TONSILLECTOMY       OB History   None      Home Medications    Prior to Admission medications   Medication Sig Start Date End Date Taking? Authorizing Provider  albuterol (PROVENTIL HFA;VENTOLIN HFA) 108 (90 BASE) MCG/ACT inhaler Inhale 2 puffs into the lungs every 6 (six) hours as needed. Shortness of breath    [provider]  amLODipine (NORVASC) 10 MG tablet TAKE ONE TABLET BY MOUTH ONCE DAILY 01/24/18   Tonny Bollman, MD  aspirin 81 MG tablet Take 1 tablet (81 mg total) daily by mouth. 09/12/17   Tonny Bollman, MD  carvedilol (COREG) 25 MG tablet TAKE ONE TABLET BY MOUTH TWICE DAILY WITH MEALS 12/06/17   Robbie Lis M, PA-C  clopidogrel (PLAVIX) 75 MG tablet TAKE ONE TABLET BY MOUTH ONCE DAILY 10/23/17   Rosalio Macadamia, NP  Fluticasone-Salmeterol (ADVAIR) 250-50 MCG/DOSE AEPB Inhale 1 puff into the lungs every 12 (twelve) hours as needed. Shortness of breath    [provider]  furosemide (LASIX) 40 MG tablet Take 40 mg by mouth daily as needed for fluid or edema (only when needed for swelling or 3lbs weight gain for a day).    [provider]  isosorbide mononitrate (IMDUR) 30 MG 24 hr tablet TAKE ONE-HALF TABLET BY MOUTH ONCE DAILY 09/20/17   Laverda Page B, NP  levothyroxine (SYNTHROID, LEVOTHROID) 125 MCG tablet Take 125 mcg by mouth daily before breakfast.    [provider]  loratadine (CLARITIN) 10 MG tablet Take 10 mg daily by mouth.    [provider]  losartan (COZAAR) 100 MG tablet Take 100 mg by mouth daily.    [provider]  metaxalone (SKELAXIN) 800 MG tablet Take 800 mg by mouth 2 (two) times daily as needed for muscle spasms.     [provider]  nitroGLYCERIN (NITROSTAT) 0.4 MG SL tablet Place 0.4 mg under the tongue  every 5 (five) minutes as needed for chest pain.    [provider]  nystatin (MYCOSTATIN/NYSTOP) powder Apply 1 application 4 (four) times daily as needed topically. Skin irritation 11/17/14   [provider]  oxycodone (OXY-IR) 5 MG capsule Take 5 mg by mouth every 4 (four) hours as needed for pain.     [provider]  pantoprazole (PROTONIX) 40 MG tablet TAKE ONE TABLET BY MOUTH TWICE DAILY 10/07/17   Rosalio Macadamia, NP  potassium chloride SA (K-DUR,KLOR-CON) 20 MEQ tablet Take 20 mEq by mouth as directed.    [provider]  ranitidine (ZANTAC) 75 MG tablet Take 75 mg by mouth daily. Pt states she is taking this  along with the protonix    [provider]  rosuvastatin (CRESTOR) 20 MG tablet TAKE ONE TABLET BY MOUTH ONCE DAILY 10/11/17   Rosalio Macadamia, NP  traMADol (ULTRAM) 50 MG tablet Take 50 mg 2 (two) times daily as needed by mouth. Headache 08/31/17   [provider]    Family History History reviewed. No pertinent family history.  Social History Social History   Tobacco Use  . Smoking status: Never Smoker  . Smokeless tobacco: Never Used  Substance Use Topics  . Alcohol use: No  . Drug use: No     Allergies   Sulfonamide derivatives   Review of Systems Review of Systems  Constitutional: Negative for chills and fever.  Musculoskeletal:       Mild swelling, pain to dorsum left hand.   Skin:       Small scab to left hand, no purulent drainage.   Neurological: Negative for numbness.     Physical Exam Updated Vital Signs BP (!) 149/74 (BP Location: Right Arm)   Pulse 74   Temp 98.1 F (36.7 C) (Oral)   Resp 18   Ht 1.524 m (5')   Wt 87.1 kg (192 lb)   SpO2 100%   BMI 37.50 kg/m   Physical Exam  Constitutional: She appears well-developed and well-nourished. No distress.  HENT:  Head: Atraumatic.  Eyes: Conjunctivae are normal. No scleral icterus.  Neck: Neck supple. No tracheal deviation present.    Cardiovascular: Intact distal pulses.  Pulmonary/Chest: Effort normal. No respiratory distress.  Abdominal: Normal appearance.  Musculoskeletal: She exhibits no edema.  Patient w small 2 mm diam scab to dorsum of proximal left hand. No purulent drainage to area. No fluctuance. Pt with mild erythema and increased warm to dorsum left hand and wrist. No pain w movement of digits, no tenosynovitis. No lymphangitis.   Neurological: She is alert.  Skin: Skin is warm and dry. No rash noted. She is not diaphoretic.  Psychiatric: She has a normal mood and affect.  Nursing note and vitals reviewed.    ED Treatments / Results  Labs (all labs ordered are listed, but only abnormal results are displayed) Labs Reviewed - No data to display  EKG None  Radiology Dg Hand Complete Left  Result Date: 02/22/2018 CLINICAL DATA:  Pain and swelling following wood foreign body, initial encounter EXAM: LEFT HAND - COMPLETE 3+ VIEW COMPARISON:  None. FINDINGS: Considerable soft tissue swelling is noted. No acute fracture or dislocation is noted. No radiopaque foreign body is noted. It should be noted wood foreign bodies are typically radiolucent. IMPRESSION: Significant soft tissue swelling is noted. No radiopaque foreign body is seen. Electronically Signed   By: Alcide Clever M.D.   On: 02/22/2018 19:23    Procedures Procedures (including critical care time)  Medications Ordered in ED Medications  cephALEXin (KEFLEX) capsule 1,000 mg (1,000 mg Oral Given 02/22/18 2007)  ibuprofen (ADVIL,MOTRIN) tablet 400 mg (400 mg Oral Given 02/22/18 2007)     Initial Impression / Assessment and Plan / ED Course  I have reviewed the triage vital signs and the nursing notes.  Pertinent labs & imaging results that were available during my care of the patient were reviewed by me and considered in my medical decision making (see chart for details).  Xrays.  Xrays reviewed - no fb seen. No fx.   Tetanus im.    Confirmed nkda x sulfa.   Keflex po.  rx for home.  Final Clinical Impressions(s) / ED Diagnoses   Final diagnoses:  None    ED Discharge Orders    None       Cathren Laine, MD 02/22/18 2019

## 2018-04-04 ENCOUNTER — Other Ambulatory Visit: Payer: Self-pay | Admitting: Cardiology

## 2018-04-11 ENCOUNTER — Telehealth: Payer: Self-pay | Admitting: Cardiovascular Disease

## 2018-04-11 ENCOUNTER — Ambulatory Visit (INDEPENDENT_AMBULATORY_CARE_PROVIDER_SITE_OTHER): Payer: Medicare Other | Admitting: Cardiovascular Disease

## 2018-04-11 ENCOUNTER — Encounter: Payer: Self-pay | Admitting: Cardiovascular Disease

## 2018-04-11 VITALS — BP 118/70 | HR 73 | Ht 60.0 in | Wt 199.4 lb

## 2018-04-11 DIAGNOSIS — I25119 Atherosclerotic heart disease of native coronary artery with unspecified angina pectoris: Secondary | ICD-10-CM | POA: Diagnosis not present

## 2018-04-11 DIAGNOSIS — I350 Nonrheumatic aortic (valve) stenosis: Secondary | ICD-10-CM | POA: Diagnosis not present

## 2018-04-11 NOTE — Telephone Encounter (Signed)
   Primary Cardiologist:Dr Excell Seltzer  Chart reviewed as part of pre-operative protocol coverage. Because of [name]'s past medical history and time since last visit, he/she will require a follow-up visit in order to better assess preoperative cardiovascular risk.  Pre-op covering staff: - Please schedule appointment with Dr Excell Seltzer or an APP at [facility] and call patient to inform them. - Please contact requesting surgeon's office via preferred method (i.e, phone, fax) to inform them of need for appointment prior to surgery.  Corine Shelter, PA-C  04/11/2018, 2:23 PM

## 2018-04-11 NOTE — Progress Notes (Signed)
Cardiology Office Note Date:  04/11/2018   ID:  [name], DOB 01-Jul-1951, MRN [id number]  PCP:  Dema Severin, NP  Cardiologist:  Tonny Bollman, MD    Chief Complaint  Patient presents with  . Pre-op Exam     History of Present Illness: [name] is a 67 y.o. female who presents for  follow-up evaluation.  The patient is followed for coronary artery disease.  She presented with non-STEMI in November 2017 and cardiac cath showed occlusion of her vein grafts with continued patency of the LIMA to LAD.  Her LVEF was 50% by left ventriculography and 55-60% by echo.  She also was noted to have mild to moderate aortic stenosis.  The patient has done relatively well on antianginal medical therapy since that time.  She is here with her husband today.  Preoperative cardiovascular assessment has been requested for dental extraction.  She will require extraction of 3 of her teeth.  She is having a lot of pain and eager to have this surgery done.  She reports no change in cardiac symptoms.  She complains of exertional chest discomfort that has been stable over the past year.  She denies any progressive symptoms.  She denies any recent use of sublingual nitroglycerin.  No resting chest pain or pressure.  She has chronic shortness of breath with activity, also unchanged.  Denies orthopnea, PND, heart palpitations, lightheadedness, or syncope.  She has leg swelling when she is up on her feet a lot.    Past Medical History:  Diagnosis Date  . ALLERGIC RHINITIS   . ANEMIA-IRON DEFICIENCY   . ANXIETY   . ASTHMA   . BREAST MASS, LEFT   . CAD (coronary artery disease)    1. s/p inf MI 2007 c/b low BP and brady, req temp pacer and pressors;  tx with DES x 2 to RCA;  2. inf STEMI 10/12 - PL occluded but small, med Rx recommended;  3.  NSTEMI 01/2012 - LHC with 3v CAD adn EF 45%; Echo with mild LVH, inf HK, EF 55-605, grade 1 diast dysfxn, mild AS;  4. s/p CABG 02/04/12: L-LAD, S-RI, S-OM1,  S-PD/PLVB ([facility]) 5. NSTEMI 09/11/16 patent LIMA to LAD, EF 55-60%  . COLONIC POLYPS, HX OF   . COPD   . DIVERTICULITIS, HX OF   . FATIGUE, CHRONIC   . FIBROMYALGIA   . GERD   . GLUCOSE INTOLERANCE   . HYPERLIPIDEMIA 08/23/2007  . HYPERTENSION 08/23/2007  . HYPOTHYROIDISM   . IBS   . LOW BACK PAIN   . MENOPAUSAL DISORDER   . OBESITY   . OSTEOARTHRITIS   . PERIPHERAL EDEMA   . PERIPHERAL VASCULAR DISEASE 07/15/2009  . RENAL INSUFFICIENCY 03/02/2009  . Somatization disorder 08/23/2007  . Stroke [facility])     Past Surgical History:  Procedure Laterality Date  . ABDOMINAL HYSTERECTOMY    . APPENDECTOMY    . CARDIAC CATHETERIZATION    . CARDIAC CATHETERIZATION N/A 09/11/2016   Procedure: Left Heart Cath and Coronary Angiography;  Surgeon: Tonny Bollman, MD;  Location: [facility] INVASIVE CV LAB;  Service: Cardiovascular;  Laterality: N/A;  . CARDIAC CATHETERIZATION N/A 09/11/2016   Procedure: Coronary Balloon Angioplasty;  Surgeon: Tonny Bollman, MD;  Location: [facility] INVASIVE CV LAB;  Service: Cardiovascular;  Laterality: N/A;  . CHOLECYSTECTOMY    . COLON SURGERY    . CORONARY ANGIOPLASTY    . CORONARY ARTERY BYPASS GRAFT  02/04/2012   Procedure: CORONARY ARTERY BYPASS  GRAFTING (CABG);  Surgeon: Loreli Slot, MD;  Location: [facility] OR;  Service: Open Heart Surgery;  Laterality: N/A;  CABG x five  using left internal mammary artery and right leg greater saphenous vein harvested endoscopically  . CORONARY STENT PLACEMENT    . LEFT HEART CATHETERIZATION WITH CORONARY ANGIOGRAM N/A 01/30/2012   Procedure: LEFT HEART CATHETERIZATION WITH CORONARY ANGIOGRAM;  Surgeon: Kathleene Hazel, MD;  Location: [facility] CATH LAB;  Service: Cardiovascular;  Laterality: N/A;  . OOPHORECTOMY    . REPLACEMENT TOTAL KNEE    . TONSILLECTOMY      Current Outpatient Medications  Medication Sig Dispense Refill  . albuterol (PROVENTIL HFA;VENTOLIN HFA) 108 (90 BASE) MCG/ACT inhaler Inhale 2 puffs into the lungs  every 6 (six) hours as needed. Shortness of breath    . amLODipine (NORVASC) 10 MG tablet TAKE ONE TABLET BY MOUTH ONCE DAILY 90 tablet 2  . aspirin 81 MG tablet Take 1 tablet (81 mg total) daily by mouth. 30 tablet   . carvedilol (COREG) 25 MG tablet Take 1 tablet (25 mg total) by mouth 2 (two) times daily with a meal. 180 tablet 2  . clopidogrel (PLAVIX) 75 MG tablet TAKE ONE TABLET BY MOUTH ONCE DAILY 90 tablet 3  . Fluticasone-Salmeterol (ADVAIR) 250-50 MCG/DOSE AEPB Inhale 1 puff into the lungs every 12 (twelve) hours as needed. Shortness of breath    . furosemide (LASIX) 40 MG tablet Take 40 mg by mouth daily as needed for fluid or edema (only when needed for swelling or 3lbs weight gain for a day).    . isosorbide mononitrate (IMDUR) 30 MG 24 hr tablet TAKE ONE-HALF TABLET BY MOUTH ONCE DAILY 45 tablet 3  . levothyroxine (SYNTHROID, LEVOTHROID) 125 MCG tablet Take 125 mcg by mouth daily before breakfast.    . loratadine (CLARITIN) 10 MG tablet Take 10 mg daily by mouth.    . losartan (COZAAR) 100 MG tablet Take 100 mg by mouth daily.    . nitroGLYCERIN (NITROSTAT) 0.4 MG SL tablet Place 0.4 mg under the tongue every 5 (five) minutes as needed for chest pain.    Marland Kitchen nystatin (MYCOSTATIN/NYSTOP) powder Apply 1 application 4 (four) times daily as needed topically. Skin irritation    . oxycodone (OXY-IR) 5 MG capsule Take 5 mg by mouth every 4 (four) hours as needed for pain.     . pantoprazole (PROTONIX) 40 MG tablet TAKE ONE TABLET BY MOUTH TWICE DAILY 180 tablet 2  . potassium chloride SA (K-DUR,KLOR-CON) 20 MEQ tablet Take 20 mEq by mouth as directed.    . ranitidine (ZANTAC) 75 MG tablet Take 75 mg by mouth daily. Pt states she is taking this along with the protonix    . rosuvastatin (CRESTOR) 20 MG tablet TAKE ONE TABLET BY MOUTH ONCE DAILY 90 tablet 3  . traMADol (ULTRAM) 50 MG tablet Take 50 mg 2 (two) times daily as needed by mouth. Headache     No current facility-administered  medications for this visit.     Allergies:   Sulfonamide derivatives   Social History:  The patient  reports that she has never smoked. She has never used smokeless tobacco. She reports that she does not drink alcohol or use drugs.   Family History:  The patient's family history is not on file.    ROS:  Please see the history of present illness.  All other systems are reviewed and negative.    PHYSICAL EXAM: VS:  BP 118/70   Pulse  73   Ht 5' (1.524 m)   Wt 199 lb 6.4 oz (90.4 kg)   SpO2 95%   BMI 38.94 kg/m  , BMI Body mass index is 38.94 kg/m. GEN: obese woman, in no acute distress  HEENT: normal  Neck: no JVD, no masses. No carotid bruits Cardiac: RRR with 2/6 systolic murmur at the RUSB, no diastolic murmur              Respiratory:  clear to auscultation bilaterally, normal work of breathing GI: soft, nontender, nondistended, + BS MS: no deformity or atrophy  Ext: 1+ bilateral pretibial edema, pedal pulses 2+= bilaterally Skin: warm and dry, no rash Neuro:  Strength and sensation are intact Psych: euthymic mood, full affect  EKG:  EKG is ordered today. The ekg ordered today shows normal sinus rhythm 73 bpm, possible inferior infarct age undetermined.  Recent Labs: No results found for requested labs within last 8760 hours.   Lipid Panel     Component Value Date/Time   CHOL 171 11/16/2016 1147   TRIG 189 (H) 11/16/2016 1147   HDL 43 11/16/2016 1147   CHOLHDL 4.0 11/16/2016 1147   CHOLHDL 6.4 09/12/2016 0256   VLDL 56 (H) 09/12/2016 0256   LDLCALC 90 11/16/2016 1147   LDLDIRECT 168.9 08/22/2010 0840     Wt Readings from Last 3 Encounters:  04/11/18 199 lb 6.4 oz (90.4 kg)  02/22/18 192 lb (87.1 kg)  09/12/17 199 lb 12.8 oz (90.6 kg)    Cardiac Studies Reviewed: Echo 09/20/2017: Study Conclusions  - Left ventricle: The cavity size was normal. There was mild   concentric hypertrophy. Systolic function was normal. The   estimated ejection fraction was  in the range of 60% to 65%. Wall   motion was normal; there were no regional wall motion   abnormalities. Doppler parameters are consistent with abnormal   left ventricular relaxation (grade 1 diastolic dysfunction). - Aortic valve: Trileaflet; mildly thickened, mildly calcified   leaflets. There was moderate stenosis. Peak velocity (S): 306   cm/s. Mean gradient (S): 19 mm Hg. Valve area (VTI): 1.24 cm^2.   Valve area (Vmax): 1.2 cm^2. Valve area (Vmean): 1.24 cm^2. - Mitral valve: Moderately calcified annulus.  Impressions:  - Compared to the prior study, there has been no significant   interval change.  ASSESSMENT AND PLAN: 1.  CAD, native vessel, with angina: stable symptoms reported.  Antianginal program is reviewed and includes isosorbide and carvedilol.  No changes recommended today.  2. Moderate aortic stenosis: most recent echo reviewed with stable findings. Continue annual echo surveillance.  Exam is unchanged today.  3. Preoperative CV evaluation:  Hold plavix 3 days prior to dental extraction. Resume day following extraction if no major bleeding.  Stable symptoms noted.  Can proceed at low risk of cardiac complications.  4.  Hypertension: Blood pressure is well controlled.  5.  Hyperlipidemia, mixed: Treated with rosuvastatin.  Current medicines are reviewed with the patient today.  The patient does not have concerns regarding medicines.  Labs/ tests ordered today include:   Orders Placed This Encounter  Procedures  . EKG 12-Lead  . ECHOCARDIOGRAM COMPLETE    Disposition:   FU 6 months with an echo prior to the office visit  Signed, Tonny Bollman, MD  04/11/2018 4:45 PM    [facility] [address] [place], [place], [place]  [address] Phone: [phone]; Fax: [phone]

## 2018-04-11 NOTE — Patient Instructions (Signed)
Medication Instructions:  1) You may hold your PLAVIX starting now for your upcoming dental procedures. You may restart the day after your teeth are extracted if your dentist deems safe.  Labwork: None  Testing/Procedures: Your provider has requested that you have an echocardiogram in 6 months. Echocardiography is a painless test that uses sound waves to create images of your heart. It provides your doctor with information about the size and shape of your heart and how well your heart's chambers and valves are working. This procedure takes approximately one hour. There are no restrictions for this procedure.  Follow-Up: Your provider recommends that you schedule a follow-up appointment in 6 months with Dr. Excell Seltzer. You will be called to arrange this appointment and your echocardiogram.  Any Other Special Instructions Will Be Listed Below (If Applicable). You have been cleared for your dental extractions! Please see above for your medication instructions.     If you need a refill on your cardiac medications before your next appointment, please call your pharmacy.

## 2018-04-11 NOTE — Telephone Encounter (Signed)
Walk In pt Form-Clearance Form Dropped off placed in Triage doc box.

## 2018-04-11 NOTE — Telephone Encounter (Signed)
° °  [facility] Pre-operative Risk Assessment    Request for surgical clearance:  1. What type of surgery is being performed? 3 extractions #9, #11 and #12, Dental procedure   2. When is this surgery scheduled? TBD  3. What type of clearance is required (medical clearance vs. Pharmacy clearance to hold med vs. Both)? Both   4. Are there any medications that need to be held prior to surgery and how long? Plavix and any other medications that need to be held.   5. Practice name and name of physician performing surgery? [facility], [place]  6. What is your office phone number [phone]   7.   What is your office fax number [phone]  8.   Anesthesia type (None, local, MAC, general) ? Not listed.    Derl Barrow 04/11/2018, 2:00 PM  _________________________________________________________________   (provider comments below)

## 2018-04-11 NOTE — Telephone Encounter (Signed)
Spoke with pt, patient will see dr cooper today for clearance.  Will make Dr Shellia Carwin aware.

## 2018-06-13 ENCOUNTER — Other Ambulatory Visit: Payer: Self-pay | Admitting: Nurse Practitioner

## 2018-06-20 ENCOUNTER — Ambulatory Visit: Payer: Medicare Other | Admitting: Cardiovascular Disease

## 2018-07-17 ENCOUNTER — Other Ambulatory Visit: Payer: Self-pay | Admitting: Nurse Practitioner

## 2018-10-10 ENCOUNTER — Ambulatory Visit (HOSPITAL_COMMUNITY): Payer: Medicare Other | Attending: Cardiology

## 2018-10-10 ENCOUNTER — Other Ambulatory Visit: Payer: Self-pay

## 2018-10-10 ENCOUNTER — Encounter (INDEPENDENT_AMBULATORY_CARE_PROVIDER_SITE_OTHER): Payer: Self-pay

## 2018-10-10 DIAGNOSIS — I350 Nonrheumatic aortic (valve) stenosis: Secondary | ICD-10-CM | POA: Insufficient documentation

## 2018-10-13 ENCOUNTER — Ambulatory Visit (INDEPENDENT_AMBULATORY_CARE_PROVIDER_SITE_OTHER): Payer: Medicare Other | Admitting: Cardiovascular Disease

## 2018-10-13 ENCOUNTER — Encounter: Payer: Self-pay | Admitting: Cardiovascular Disease

## 2018-10-13 VITALS — BP 142/82 | HR 74 | Ht 60.0 in | Wt 194.0 lb

## 2018-10-13 DIAGNOSIS — I10 Essential (primary) hypertension: Secondary | ICD-10-CM

## 2018-10-13 DIAGNOSIS — E782 Mixed hyperlipidemia: Secondary | ICD-10-CM

## 2018-10-13 DIAGNOSIS — I25119 Atherosclerotic heart disease of native coronary artery with unspecified angina pectoris: Secondary | ICD-10-CM | POA: Diagnosis not present

## 2018-10-13 NOTE — Progress Notes (Signed)
Cardiology Office Note:    Date:  10/13/2018   ID:  [name], DOB 1950-12-09, MRN [id number]  PCP:  Imagene Riches, NP  Cardiologist:  Sherren Mocha, MD  Electrophysiologist:  None   Referring MD: Imagene Riches, NP   Chief Complaint  Patient presents with  . Shortness of Breath    History of Present Illness:    [name] is a 67 y.o. female with a hx of extensive CAD, presenting for follow-up evaluation.  The patient is undergone multivessel CABG.  She most recently presented with non-STEMI in 2017 and was found to have occlusion of all of her vein grafts.  There is continued patency of the LIMA to LAD.  Attempted PCI none of 1 of the vein grafts was performed, but unsuccessful in restoring flow.  The patient's LVEF has been in the range of 50%.  She has been noted to have mild to moderate aortic stenosis.  The patient is here with her husband today.  She reports no significant change in her chronic anginal symptoms.  She does have chest pain present intermittently at rest and with exertion.  She has not taken nitroglycerin recently.  She stopped taking isosorbide because of headaches.  She complains of exertional dyspnea, unchanged over time.  No orthopnea or PND.  She is afraid she might need revision of right knee replacement as she is having pain and swelling of the knee.  She reports no recent changes in her medications.  Past Medical History:  Diagnosis Date  . ALLERGIC RHINITIS   . ANEMIA-IRON DEFICIENCY   . ANXIETY   . ASTHMA   . BREAST MASS, LEFT   . CAD (coronary artery disease)    1. s/p inf MI 2007 c/b low BP and brady, req temp pacer and pressors;  tx with DES x 2 to RCA;  2. inf STEMI 10/12 - PL occluded but small, med Rx recommended;  3.  NSTEMI 01/2012 - LHC with 3v CAD adn EF 45%; Echo with mild LVH, inf HK, EF 55-605, grade 1 diast dysfxn, mild AS;  4. s/p CABG 02/04/12: L-LAD, S-RI, S-OM1, S-PD/PLVB ([facility]) 5. NSTEMI 09/11/16 patent LIMA to LAD, EF  55-60%  . COLONIC POLYPS, HX OF   . COPD   . DIVERTICULITIS, HX OF   . FATIGUE, CHRONIC   . FIBROMYALGIA   . GERD   . GLUCOSE INTOLERANCE   . HYPERLIPIDEMIA 08/23/2007  . HYPERTENSION 08/23/2007  . HYPOTHYROIDISM   . IBS   . LOW BACK PAIN   . MENOPAUSAL DISORDER   . OBESITY   . OSTEOARTHRITIS   . PERIPHERAL EDEMA   . PERIPHERAL VASCULAR DISEASE 07/15/2009  . RENAL INSUFFICIENCY 03/02/2009  . Somatization disorder 08/23/2007  . Stroke [facility])     Past Surgical History:  Procedure Laterality Date  . ABDOMINAL HYSTERECTOMY    . APPENDECTOMY    . CARDIAC CATHETERIZATION    . CARDIAC CATHETERIZATION N/A 09/11/2016   Procedure: Left Heart Cath and Coronary Angiography;  Surgeon: Sherren Mocha, MD;  Location: [place] CV LAB;  Service: Cardiovascular;  Laterality: N/A;  . CARDIAC CATHETERIZATION N/A 09/11/2016   Procedure: Coronary Balloon Angioplasty;  Surgeon: Sherren Mocha, MD;  Location: [place] CV LAB;  Service: Cardiovascular;  Laterality: N/A;  . CHOLECYSTECTOMY    . COLON SURGERY    . CORONARY ANGIOPLASTY    . CORONARY ARTERY BYPASS GRAFT  02/04/2012   Procedure: CORONARY ARTERY BYPASS GRAFTING (CABG);  Surgeon: Remo Lipps  Chaya Jan, MD;  Location: [place];  Service: Open Heart Surgery;  Laterality: N/A;  CABG x five  using left internal mammary artery and right leg greater saphenous vein harvested endoscopically  . CORONARY STENT PLACEMENT    . LEFT HEART CATHETERIZATION WITH CORONARY ANGIOGRAM N/A 01/30/2012   Procedure: LEFT HEART CATHETERIZATION WITH CORONARY ANGIOGRAM;  Surgeon: Burnell Blanks, MD;  Location[facility] C[facility] LAB;  Service: Cardiovascular;  Laterality: N/A;  . OOPHORECTOMY    . REPLACEMENT TOTAL KNEE    . TONSILLECTOMY      Current Medications: Current Meds  Medication Sig  . albuterol (PROVENTIL HFA;VENTOLIN HFA) 108 (90 BASE) MCG/ACT inhaler Inhale 2 puffs into the lungs every 6 (six) hours as needed. Shortness of breath  . amLODipine (NORVASC)  10 MG tablet TAKE ONE TABLET BY MOUTH ONCE DAILY  . aspirin 81 MG tablet Take 1 tablet (81 mg total) daily by mouth.  . carvedilol (COREG) 25 MG tablet Take 1 tablet (25 mg total) by mouth 2 (two) times daily with a meal.  . Cholecalciferol (VITAMIN D3) 1.25 MG (50000 UT) CAPS Take 1 capsule by mouth once a week.  . clopidogrel (PLAVIX) 75 MG tablet TAKE ONE TABLET BY MOUTH ONCE DAILY  . Fluticasone-Salmeterol (ADVAIR) 250-50 MCG/DOSE AEPB Inhale 1 puff into the lungs every 12 (twelve) hours as needed. Shortness of breath  . furosemide (LASIX) 40 MG tablet Take 40 mg by mouth daily as needed for fluid or edema (only when needed for swelling or 3lbs weight gain for a day).  . isosorbide mononitrate (IMDUR) 30 MG 24 hr tablet TAKE ONE-HALF TABLET BY MOUTH ONCE DAILY  . levothyroxine (SYNTHROID, LEVOTHROID) 125 MCG tablet Take 125 mcg by mouth daily before breakfast.  . loratadine (CLARITIN) 10 MG tablet Take 10 mg daily by mouth.  . losartan (COZAAR) 100 MG tablet Take 100 mg by mouth daily.  . nitroGLYCERIN (NITROSTAT) 0.4 MG SL tablet Place 0.4 mg under the tongue every 5 (five) minutes as needed for chest pain.  Marland Kitchen nystatin (MYCOSTATIN/NYSTOP) powder Apply 1 application 4 (four) times daily as needed topically. Skin irritation  . oxycodone (OXY-IR) 5 MG capsule Take 5 mg by mouth every 4 (four) hours as needed for pain.   . pantoprazole (PROTONIX) 40 MG tablet TAKE 1 TABLET BY MOUTH TWICE DAILY  . potassium chloride SA (K-DUR,KLOR-CON) 20 MEQ tablet Take 20 mEq by mouth as directed.  . ranitidine (ZANTAC) 75 MG tablet Take 75 mg by mouth daily. Pt states she is taking this along with the protonix  . rosuvastatin (CRESTOR) 20 MG tablet TAKE 1 TABLET BY MOUTH ONCE DAILY  . traMADol (ULTRAM) 50 MG tablet Take 50 mg 2 (two) times daily as needed by mouth. Headache     Allergies:   Sulfonamide derivatives   Social History   Socioeconomic History  . Marital status: Married    Spouse name: Not on  file  . Number of children: Not on file  . Years of education: Not on file  . Highest education level: Not on file  Occupational History  . Not on file  Social Needs  . Financial resource strain: Not on file  . Food insecurity:    Worry: Not on file    Inability: Not on file  . Transportation needs:    Medical: Not on file    Non-medical: Not on file  Tobacco Use  . Smoking status: Never Smoker  . Smokeless tobacco: Never Used  Substance and  Sexual Activity  . Alcohol use: No  . Drug use: No  . Sexual activity: Not on file  Lifestyle  . Physical activity:    Days per week: Not on file    Minutes per session: Not on file  . Stress: Not on file  Relationships  . Social connections:    Talks on phone: Not on file    Gets together: Not on file    Attends religious service: Not on file    Active member of club or organization: Not on file    Attends meetings of clubs or organizations: Not on file    Relationship status: Not on file  Other Topics Concern  . Not on file  Social History Narrative  . Not on file     Family History: The patient's family history is not on file.  ROS:   Please see the history of present illness.    All other systems reviewed and are negative.  EKGs/Labs/Other Studies Reviewed:    The following studies were reviewed today: 2D echocardiogram 10/10/2018: Study Conclusions  - Left ventricle: The cavity size was normal. There was moderate   concentric hypertrophy. Systolic function was normal. The   estimated ejection fraction was in the range of 50% to 55%. There   is akinesis and aneurysmal deformity of the basalinferior   myocardium. Doppler parameters are consistent with abnormal left   ventricular relaxation (grade 1 diastolic dysfunction). - Aortic valve: Trileaflet; moderately thickened, moderately   calcified leaflets. Valve mobility was restricted. There was mild   stenosis. Peak velocity (S): 244 cm/s. Mean gradient (S): 9 mm    Hg. Valve area (VTI): 1.56 cm^2. - Mitral valve: Moderately calcified annulus. - Left atrium: The atrium was mildly dilated.  Impressions:  - Aortic stenosis has not advanced.  Cardiac Cath 09-11-2016: Conclusion   1. Severe native three-vessel coronary artery disease with total occlusion of the RCA, total occlusion of the mid LAD, and total occlusion of the first OM 2. Status post aortocoronary bypass surgery with continued patency of the LIMA to LAD, chronic total occlusion of the saphenous vein graft sequential to ramus and first OM, and acute total occlusion of the sequential saphenous vein graft to PDA and PLA 3. Mild contraction abnormality of the left ventricle with inferior wall akinesis and LVEF estimated at 50% 4. Unsuccessful balloon angioplasty and aspiration thrombectomy of the saphenous vein graft to PDA and PLA with TIMI 0 flow at the completion of the procedure  Recommend: Post-MI medical therapy  Indications   NSTEMI (non-ST elevated myocardial infarction) ([place]) [I21.4 (ICD-10-CM)]  Procedural Details   Technical Details INDICATION: 67 year old woman with known CAD and previous multivessel CABG in 2013. She presents with ongoing ischemic chest pain symptoms and non-ST elevation MI.  PROCEDURAL DETAILS: The left wrist was prepped, draped, and anesthetized with 1% lidocaine. Using the modified Seldinger technique, a 5/6 French Slender sheath was introduced into the left radial artery. 3 mg of verapamil was administered through the sheath, weight-based unfractionated heparin was administered intravenously. Standard Judkins catheters were used for selective coronary angiography and left ventriculography. The bypass grafts were imaged with a multipurpose catheter and a JR4 catheter. PCI was performed after the diagnostic procedure. Catheter exchanges were performed over an exchange length guidewire. There were no immediate procedural complications. A TR band was used for radial  hemostasis at the completion of the procedure. The patient was transferred to the post catheterization recovery area for further monitoring.   Estimated  blood loss <50 mL.  During this procedure the patient was administered the following to achieve and maintain moderate conscious sedation: Versed 4 mg, Fentanyl 75 mcg, while the patient's heart rate, blood pressure, and oxygen saturation were continuously monitored. The period of conscious sedation was 62 minutes, of which I was present face-to-face 100% of this time.  Medications  (Filter: Administrations occurring from 09/11/16 1613 to 09/11/16 1753)  Medication Rate/Dose/Volume Action  Date Time   midazolam (VERSED) injection (mg) 2 mg Given 09/11/16 1632   Total dose as of 10/13/18 1600 1 mg Given 1646   4 mg 1 mg Given 1729   fentaNYL (SUBLIMAZE) injection (mcg) 25 mcg Given 09/11/16 1632   Total dose as of 10/13/18 1600 25 mcg Given 1646   75 mcg 25 mcg Given 1730   heparin infusion 2 units/mL in 0.9 % sodium chloride (mL) 1,000 mL New Bag/Given 09/11/16 1643   Total dose as of 10/13/18 1600        Cannot be calculated        Radial Cocktail/Verapamil only (mL) 10 mL Given 09/11/16 1643   Total dose as of 10/13/18 1600        10 mL        lidocaine (PF) (XYLOCAINE) 1 % injection (mL) 2 mL Given 09/11/16 1643   Total dose as of 10/13/18 1600        2 mL        heparin injection (Units) 5,000 Units Given 09/11/16 1648   Total dose as of 10/13/18 1600 4,000 Units Given 1706   9,000 Units        iopamidol (ISOVUE-370) 76 % injection (mL) 100 mL Given 09/11/16 1741   Total dose as of 10/13/18 1600        100 mL        Sedation Time   Sedation Time Physician-1: 1 hour 47 seconds  Coronary Findings   Diagnostic  Dominance: Right  Left Anterior Descending  Prox LAD lesion 100% stenosed  Prox LAD lesion.  Ramus Intermedius  Ramus lesion 90% stenosed  Small vessel with 90% stenosis at the insertion of the saphenous vein graft    Left Circumflex  First Obtuse Marginal Branch  1st Mrg lesion 100% stenosed  The vessel fills late from left to left collaterals  Right Coronary Artery  Prox RCA lesion 100% stenosed  Prox RCA lesion.  Mid RCA to Dist RCA lesion 100% stenosed  The lesion was previously treated.  jump graft Graft to RPDA  Seq SVG- PDA and PLA graft was visualized by angiography and is normal in caliber. The graft exhibits severe . 100% occlusion, likely 'culprit'  Prox Graft lesion 100% stenosed  The lesion is thrombotic.  Sequential jump graft Graft to Ramus, 1st Mrg  Seq SVG- OM1 and Ramus.  Prox Graft lesion before Ramus 100% stenosed  Chronic occlusion  LIMA LIMA Graft to Mid LAD  LIMA graft was visualized by angiography and is normal in caliber and anatomically normal. LIMA-LAD is widely patent  Intervention   Prox Graft lesion (jump graft Graft to RPDA)  Angioplasty  Angioplasty alone was performed. There is no pre-interventional antegrade distal flow (TIMI 0). There is no post-interventional antegrade distal flow (TIMI 0). The intervention was unsuccessful . No complications occurred at this lesion. The saphenous vein graft sequential to the right PDA and PLA is totally occluded. There is an angiographic appearance of an acute occlusion. Based on the patient's clinical history, I  suspected the grafted been occluded for 24 hours. However, the patient was still having active chest pain so PCI was attempted. Heparin is used for coagulation. The patient has been on aspirin and Plavix. A 6 [place] multipurpose guide catheter is used. A cougar guidewire is passed beyond the lesion without difficulty. The entire graft is dilated multiple times with a 2.25 x 30 mm balloon up to 8 atm. The balloon was well expanded throughout. Flow remained TIMI 0 following angioplasty. Aspiration thrombectomy was then performed with multiple passes. Flow remained TIMI 0. I did not think there was any other means to reopen the  vein graft. Suspect there was extensive thrombus throughout. The patient is clinically stable. There are no PCI-related complications.  There is a 100% residual stenosis post intervention.  Wall Motion              Left Heart   Left Ventricle The left ventricular size is normal. There is mild left ventricular systolic dysfunction. LV end diastolic pressure is normal. The left ventricular ejection fraction is 50-55% by visual estimate. There are LV function abnormalities due to segmental dysfunction. The basal and midinferior walls are akinetic, the other LV wall segments contract normally. LVEF is estimated at 50%.  Coronary Diagrams   Diagnostic  Dominance: Right    EKG:  EKG is not ordered today.   Recent Labs: No results found for requested labs within last 8760 hours.  Recent Lipid Panel    Component Value Date/Time   CHOL 171 11/16/2016 1147   TRIG 189 (H) 11/16/2016 1147   HDL 43 11/16/2016 1147   CHOLHDL 4.0 11/16/2016 1147   CHOLHDL 6.4 09/12/2016 0256   VLDL 56 (H) 09/12/2016 0256   LDLCALC 90 11/16/2016 1147   LDLDIRECT 168.9 08/22/2010 0840    Physical Exam:    VS:  BP (!) 142/82   Pulse 74   Ht 5' (1.524 m)   Wt 194 lb (88 kg)   SpO2 97%   BMI 37.89 kg/m     Wt Readings from Last 3 Encounters:  10/13/18 194 lb (88 kg)  04/11/18 199 lb 6.4 oz (90.4 kg)  02/22/18 192 lb (87.1 kg)     GEN:  Obese woman in no acute distress HEENT: Normal NECK: No JVD; No carotid bruits LYMPHATICS: No lymphadenopathy CARDIAC: RRR, 2/6 harsh early to mid peaking systolic murmur at the right upper sternal border, no diastolic murmur, preserved A2 RESPIRATORY:  Clear to auscultation without rales, wheezing or rhonchi  ABDOMEN: Soft, non-tender, non-distended MUSCULOSKELETAL: 1+ edema of the right knee and pretibial region, no significant edema on the left; No deformity  SKIN: Warm and dry NEUROLOGIC:  Alert and oriented x 3 PSYCHIATRIC:  Normal affect   ASSESSMENT:      1. Coronary artery disease involving native coronary artery of native heart with angina pectoris ([place])   2. Essential hypertension   3. Mixed hyperlipidemia    PLAN:    In order of problems listed above:  1. The patient has stable symptoms of angina.  She has had trouble tolerating isosorbide.  It appears she was taking a full pill and may try the 15 mg dose.  She continues on dual antiplatelet therapy with aspirin and clopidogrel.  She is at a goal dose of carvedilol and amlodipine.  I recommended a Lexiscan Myoview stress test to evaluate her ischemic burden.  She thinks she may require a revision of her right total knee replacement and I think further risk  stratification is indicated. 2. Blood pressure is controlled on multidrug therapy. 3. Overdue for lipid follow-up.  Lifestyle modification is reviewed at length.  She continues on a high intensity statin drug with Crestor 20 mg.  Will update lipids and LFTs.   Medication Adjustments/Labs and Tests Ordered: Current medicines are reviewed at length with the patient today.  Concerns regarding medicines are outlined above.  Orders Placed This Encounter  Procedures  . Hepatic function panel  . Lipid panel  . MYOCARDIAL PERFUSION IMAGING   No orders of the defined types were placed in this encounter.   Patient Instructions  Medication Instructions:  Your provider recommends that you continue on your current medications as directed. Please refer to the Current Medication list given to you today.    Labwork: You will need to come to your stress test fasting as we will check your cholesterol that morning.  Testing/Procedures: Your provider has requested that you have a lexiscan myoview. For further information please visit [email or web address]. Please follow instruction sheet, as given.  Follow-Up: Your provider recommends that you schedule a follow-up appointment in 6 MONTHS with Richardson Dopp, PA.    Signed, Sherren Mocha, MD   10/13/2018 5:22 PM    [facility]

## 2018-10-13 NOTE — Patient Instructions (Signed)
Medication Instructions:  Your provider recommends that you continue on your current medications as directed. Please refer to the Current Medication list given to you today.    Labwork: You will need to come to your stress test fasting as we will check your cholesterol that morning.  Testing/Procedures: Your provider has requested that you have a lexiscan myoview. For further information please visit [email or web address] Please follow instruction sheet, as given.  Follow-Up: Your provider recommends that you schedule a follow-up appointment in 6 MONTHS with Tereso Newcomer, PA.

## 2018-10-20 ENCOUNTER — Telehealth (HOSPITAL_COMMUNITY): Payer: Self-pay | Admitting: *Deleted

## 2018-10-20 NOTE — Telephone Encounter (Signed)
Patient's husband per DPR was given detailed instructions per Myocardial Perfusion Study Information Sheet for the test on 10/24/18 at 0715. Patient notified to arrive 15 minutes early and that it is imperative to arrive on time for appointment to keep from having the test rescheduled.  If you need to cancel or reschedule your appointment, please call the office within 24 hours of your appointment. . Patient verbalized understanding.Liahna Brickner, Adelene Idler

## 2018-10-22 ENCOUNTER — Other Ambulatory Visit: Payer: Self-pay | Admitting: Nurse Practitioner

## 2018-10-22 ENCOUNTER — Other Ambulatory Visit: Payer: Self-pay | Admitting: Cardiovascular Disease

## 2018-10-24 ENCOUNTER — Ambulatory Visit (HOSPITAL_COMMUNITY): Payer: Medicare Other | Attending: Cardiology

## 2018-10-24 ENCOUNTER — Other Ambulatory Visit: Payer: Medicare Other

## 2018-10-24 DIAGNOSIS — I25119 Atherosclerotic heart disease of native coronary artery with unspecified angina pectoris: Secondary | ICD-10-CM

## 2018-10-24 LAB — HEPATIC FUNCTION PANEL
ALK PHOS: 79 IU/L (ref 39–117)
ALT: 16 IU/L (ref 0–32)
AST: 18 IU/L (ref 0–40)
Albumin: 4.7 g/dL (ref 3.6–4.8)
Bilirubin Total: 0.4 mg/dL (ref 0.0–1.2)
Bilirubin, Direct: 0.1 mg/dL (ref 0.00–0.40)
TOTAL PROTEIN: 7.4 g/dL (ref 6.0–8.5)

## 2018-10-24 LAB — LIPID PANEL
CHOLESTEROL TOTAL: 188 mg/dL (ref 100–199)
Chol/HDL Ratio: 3.7 ratio (ref 0.0–4.4)
HDL: 51 mg/dL (ref >39)
LDL Calculated: 100 mg/dL — ABNORMAL HIGH (ref 0–99)
TRIGLYCERIDES: 187 mg/dL — AB (ref 0–149)
VLDL Cholesterol Cal: 37 mg/dL (ref 5–40)

## 2018-10-24 LAB — MYOCARDIAL PERFUSION IMAGING
CHL CUP NUCLEAR SDS: 3
CSEPPHR: 93 {beats}/min
LV dias vol: 71 mL (ref 46–106)
LVSYSVOL: 19 mL
NUC STRESS TID: 1.15
Rest HR: 73 {beats}/min
SRS: 10
SSS: 14

## 2018-10-24 MED ORDER — TECHNETIUM TC 99M TETROFOSMIN IV KIT
28.3000 | PACK | Freq: Once | INTRAVENOUS | Status: AC | PRN
  Administered 2018-10-24: 28.3 via INTRAVENOUS
  Filled 2018-10-24: qty 29

## 2018-10-24 MED ORDER — TECHNETIUM TC 99M TETROFOSMIN IV KIT
8.7000 | PACK | Freq: Once | INTRAVENOUS | Status: AC | PRN
  Administered 2018-10-24: 8.7 via INTRAVENOUS
  Filled 2018-10-24: qty 9

## 2018-10-24 MED ORDER — REGADENOSON 0.4 MG/5ML IV SOLN
0.4000 mg | Freq: Once | INTRAVENOUS | Status: AC
  Administered 2018-10-24: 0.4 mg via INTRAVENOUS

## 2018-10-27 ENCOUNTER — Telehealth: Payer: Self-pay

## 2018-10-27 DIAGNOSIS — I25119 Atherosclerotic heart disease of native coronary artery with unspecified angina pectoris: Secondary | ICD-10-CM

## 2018-10-27 MED ORDER — EZETIMIBE 10 MG PO TABS: 10.0000 mg | ORAL_TABLET | Freq: Every day | ORAL | 3 refills | Status: DC

## 2018-10-27 MED ORDER — ROSUVASTATIN CALCIUM 40 MG PO TABS: 40.0000 mg | ORAL_TABLET | Freq: Every day | ORAL | 3 refills | Status: AC

## 2018-10-27 NOTE — Progress Notes (Signed)
LDL above goal. Pt on crestor 20 mg. Recommend increase to 40 mg and add zetia 10 mg daily. Repeat labs in 3 months. thx

## 2018-10-27 NOTE — Telephone Encounter (Signed)
[name] Bollman, MD filed at 10/27/2018 5:30 AM   Status: Signed    LDL above goal. Pt on crestor 20 mg. Recommend increase to 40 mg and add zetia 10 mg daily. Repeat labs in 3 months. thx       Reviewed results with patient's DPR who verbalized understanding. Instructed him to have her INCREASE CRESTOR to 40 mg daily and to START ZETIA 10 mg daily. FLP and LFTs scheduled 3/27. DPR was grateful for call and agrees with treatment plan.

## 2019-01-30 ENCOUNTER — Other Ambulatory Visit: Payer: Medicare Other

## 2019-03-27 ENCOUNTER — Other Ambulatory Visit: Payer: Medicare Other

## 2019-04-16 ENCOUNTER — Telehealth: Payer: Self-pay | Admitting: *Deleted

## 2019-04-16 NOTE — Progress Notes (Signed)
Virtual Visit via Telephone Note   This visit type was conducted due to national recommendations for restrictions regarding the COVID-19 Pandemic (e.g. social distancing) in an effort to limit this patient's exposure and mitigate transmission in our community.  Due to her co-morbid illnesses, this patient is at least at moderate risk for complications without adequate follow up.  This format is felt to be most appropriate for this patient at this time.  The patient did not have access to video technology/had technical difficulties with video requiring transitioning to audio format only (telephone).  All issues noted in this document were discussed and addressed.  No physical exam could be performed with this format.  Please refer to the patient's chart for her  consent to telehealth for [facility].   Date:  04/17/2019   ID:  [name], DOB July 01, 1951, MRN [id number]  Patient Location: Home Provider Location: Office  PCP:  Imagene Riches, NP  Cardiologist:  Sherren Mocha, MD   Electrophysiologist:  None   Evaluation Performed:  Follow-Up Visit  Chief Complaint:  FU on CAD w/ angina  History of Present Illness:    [name] is a 68 y.o. female with:  Coronary artery disease s/p CABG w/ chronic angina  S/p MI 2007 >> DES x 2 to RCA; Inf STEMI 08/2011 >> Tx med  S/p NSTEMI 01/2012 >> s/p CABG  NSTEMI 12/2015 >> all VG's occluded, L-LAD ok; unsuccessful PCI of SVG  Aortic Stenosis  Hypertension   Hyperlipidemia   COPD  She was last seen by Dr. Burt Knack in 10/2018.  He arrange a Nuclear stress test which was low risk and neg for ischemia.  Today, she is experiencing flank pain, nausea and hematuria.  She has chronic nephrolithiasis and these are her typical symptoms.  She will contact her PCP if the symptoms do not resolve in a day or so.  Her BP is up today but it is usually 1-teens/70s.  She has chronic angina.  This is unchanged.  She has chronic dyspnea on  exertion from COPD and this is unchanged.  She has not had syncope, paroxysmal nocturnal dyspnea, leg swelling.  She feels tired and complains of fatigue.  No hx of snoring. She has not had melena.  She has not been able to get her B12 shot in a few months.  The patient does not have symptoms concerning for COVID-19 infection (fever, chills, cough, or new shortness of breath).    Past Medical History:  Diagnosis Date  . ALLERGIC RHINITIS   . ANEMIA-IRON DEFICIENCY   . ANXIETY   . ASTHMA   . BREAST MASS, LEFT   . CAD (coronary artery disease)    1. s/p inf MI 2007 c/b low BP and brady, req temp pacer and pressors;  tx with DES x 2 to RCA;  2. inf STEMI 10/12 - PL occluded but small, med Rx recommended;  3.  NSTEMI 01/2012 - LHC with 3v CAD adn EF 45%; Echo with mild LVH, inf HK, EF 55-605, grade 1 diast dysfxn, mild AS;  4. s/p CABG 02/04/12: L-LAD, S-RI, S-OM1, S-PD/PLVB ([place]) 5. NSTEMI 09/11/16 patent LIMA to LAD, EF 55-60%  . COLONIC POLYPS, HX OF   . COPD   . DIVERTICULITIS, HX OF   . FATIGUE, CHRONIC   . FIBROMYALGIA   . GERD   . GLUCOSE INTOLERANCE   . HYPERLIPIDEMIA 08/23/2007  . HYPERTENSION 08/23/2007  . HYPOTHYROIDISM   . IBS   .  LOW BACK PAIN   . MENOPAUSAL DISORDER   . OBESITY   . OSTEOARTHRITIS   . PERIPHERAL EDEMA   . PERIPHERAL VASCULAR DISEASE 07/15/2009  . RENAL INSUFFICIENCY 03/02/2009  . Somatization disorder 08/23/2007  . Stroke [facility])    Past Surgical History:  Procedure Laterality Date  . ABDOMINAL HYSTERECTOMY    . APPENDECTOMY    . CARDIAC CATHETERIZATION    . CARDIAC CATHETERIZATION N/A 09/11/2016   Procedure: Left Heart Cath and Coronary Angiography;  Surgeon: Sherren Mocha, MD;  Location: [place] CV [facility];  Service: Cardiovascular;  Laterality: N/A;  . CARDIAC CATHETERIZATION N/A 09/11/2016   Procedure: Coronary Balloon Angioplasty;  Surgeon: Sherren Mocha, MD;  Location: [place] [facility];  Service: Cardiovascular;  Laterality: N/A;  .  CHOLECYSTECTOMY    . COLON SURGERY    . CORONARY ANGIOPLASTY    . CORONARY ARTERY BYPASS GRAFT  02/04/2012   Procedure: CORONARY ARTERY BYPASS GRAFTING (CABG);  Surgeon: Melrose Nakayama, MD;  Location: [place];  Service: Open Heart Surgery;  Laterality: N/A;  CABG x five  using left internal mammary artery and right leg greater saphenous vein harvested endoscopically  . CORONARY STENT PLACEMENT    . LEFT HEART CATHETERIZATION WITH CORONARY ANGIOGRAM N/A 01/30/2012   Procedure: LEFT HEART CATHETERIZATION WITH CORONARY ANGIOGRAM;  Surgeon: Burnell Blanks, MD;  Location[facility] CATH LAB;  Service: Cardiovascular;  Laterality: N/A;  . OOPHORECTOMY    . REPLACEMENT TOTAL KNEE    . TONSILLECTOMY       Current Meds  Medication Sig  . albuterol (PROVENTIL HFA;VENTOLIN HFA) 108 (90 BASE) MCG/ACT inhaler Inhale 2 puffs into the lungs every 6 (six) hours as needed. Shortness of breath  . amLODipine (NORVASC) 10 MG tablet TAKE 1 TABLET BY MOUTH ONCE DAILY  . aspirin 81 MG tablet Take 1 tablet (81 mg total) daily by mouth.  . carvedilol (COREG) 25 MG tablet Take 1 tablet (25 mg total) by mouth 2 (two) times daily with a meal.  . Cholecalciferol (VITAMIN D3) 1.25 MG (50000 UT) CAPS Take 1 capsule by mouth once a week.  . clopidogrel (PLAVIX) 75 MG tablet TAKE 1 TABLET BY MOUTH ONCE DAILY  . Fluticasone-Salmeterol (ADVAIR) 250-50 MCG/DOSE AEPB Inhale 1 puff into the lungs every 12 (twelve) hours as needed. Shortness of breath  . furosemide (LASIX) 40 MG tablet Take 40 mg by mouth daily as needed for fluid or edema (only when needed for swelling or 3lbs weight gain for a day).  Marland Kitchen levothyroxine (SYNTHROID, LEVOTHROID) 125 MCG tablet Take 125 mcg by mouth daily before breakfast.  . Loratadine-Pseudoephedrine (CLARITIN-D 24 HOUR PO) Take 1 tablet by mouth daily.  Marland Kitchen losartan (COZAAR) 100 MG tablet Take 100 mg by mouth daily.  . nitroGLYCERIN (NITROSTAT) 0.4 MG SL tablet Place 0.4 mg under the tongue every  5 (five) minutes as needed for chest pain.  Marland Kitchen nystatin (MYCOSTATIN/NYSTOP) powder Apply 1 application 4 (four) times daily as needed topically. Skin irritation  . pantoprazole (PROTONIX) 40 MG tablet TAKE 1 TABLET BY MOUTH TWICE DAILY  . potassium chloride SA (K-DUR,KLOR-CON) 20 MEQ tablet Take 20 mEq by mouth as directed.  . rosuvastatin (CRESTOR) 40 MG tablet Take 1 tablet (40 mg total) by mouth daily.  . traMADol (ULTRAM) 50 MG tablet Take 50 mg 2 (two) times daily as needed by mouth. Headache     Allergies:   Ezetimibe and Sulfonamide derivatives   Social History   Tobacco Use  .  Smoking status: Never Smoker  . Smokeless tobacco: Never Used  Substance Use Topics  . Alcohol use: No  . Drug use: No     Family Hx: The patient's family history is not on file.  ROS:   Please see the history of present illness.    All other systems reviewed and are negative.   Prior CV studies:   The following studies were reviewed today:  Myoview 10/13/18 EF 73; soft tissue atten, no ischemia  Echo 10/10/18 Mod conc LVH, EF 50-55, inf KAK and aneurysmal deformity, Gr 1 DD, mod AoV thickening, mild AS (mean 9), mod MAC, mild LAE  Cardiac Catheterization 09/11/16 LAD prox 100 RI (small ) 90 OM1 100 RCA prox 100, mid 100 S-PDA/PLA 100 S-RI, OM1 100 L-LAD patent EF 50   Labs/Other Tests and Data Reviewed:    EKG:  No ECG reviewed.  Recent Labs: 10/24/2018: ALT 16   Recent Lipid Panel Lab Results  Component Value Date/Time   CHOL 188 10/24/2018 08:11 AM   TRIG 187 (H) 10/24/2018 08:11 AM   HDL 51 10/24/2018 08:11 AM   CHOLHDL 3.7 10/24/2018 08:11 AM   CHOLHDL 6.4 09/12/2016 02:56 AM   LDLCALC 100 (H) 10/24/2018 08:11 AM   LDLDIRECT 168.9 08/22/2010 08:40 AM    Wt Readings from Last 3 Encounters:  04/17/19 186 lb (84.4 kg)  10/24/18 194 lb (88 kg)  10/13/18 194 lb (88 kg)     Objective:    Vital Signs:  BP (!) 182/89   Pulse 88   Ht 5' (1.524 m)   Wt 186 lb (84.4  kg)   BMI 36.33 kg/m    VITAL SIGNS:  reviewed GEN:  no acute distress RESPIRATORY:  no labored breathing NEURO:  alert and oriented PSYCH:  normal mood  ASSESSMENT & PLAN:     Coronary artery disease involving native coronary artery of native heart without angina pectoris - Plan: Hx of CABG.  All VGs are known to be occluded.  The L-LAD is patent.  Prior PCI attempt of the S-PDA was unsuccessful.  Recent Myoview was low risk and neg for ischemia.  She has chronic angina which is unchanged.  BP is up today due to recurrent nephrolithiasis.  Her BP is usually well controlled.  Continue current medical regimen with dual antiplatelet Rx, beta-blocker, statin, Amlodipine, Nitrates.    Essential hypertension - Plan: As noted her BP is up today due to a kidney stone.  Continue current regimen.  I have asked her to contact her PCP if her kidney stone symptoms do not improve.   Hyperlipidemia, unspecified hyperlipidemia type - Plan: Crestor increased in 10/2018 due to elevated LDL.  She could not tolerate Ezetimibe due to indigestion.  Arrange follow up CMET, Lipids.   Aortic valve stenosis, etiology of cardiac valve disease unspecified - Plan: Mild by recent echo.   Other fatigue - Plan:  This is probably related to her not getting her B12.  Since I am getting labs, I will also get a CBC, TSH.  COVID-19 Education: The signs and symptoms of COVID-19 were discussed with the patient and how to seek care for testing (follow up with PCP or arrange E-visit).  The importance of social distancing was discussed today.  Time:   Today, I have spent 15 minutes with the patient with telehealth technology discussing the above problems.     Medication Adjustments/Labs and Tests Ordered: Current medicines are reviewed at length with the patient today.  Concerns  regarding medicines are outlined above.   Tests Ordered: Orders Placed This Encounter  Procedures  . Comprehensive metabolic panel  . Lipid  panel  . CBC  . TSH    Medication Changes: No orders of the defined types were placed in this encounter.   Follow Up:  In Person in 4 month(s)  Signed, Richardson Dopp, PA-C  04/17/2019 1:09 PM    [facility]

## 2019-04-16 NOTE — Telephone Encounter (Signed)
Pt consented on 6-9     Confirm consent - "In the setting of the current Covid19 crisis, you are scheduled for a (phone or video) visit with your provider on (date) at (time).  Just as we do with many in-office visits, in order for you to participate in this visit, we must obtain consent.  If you'd like, I can send this to your mychart (if signed up) or email for you to review.  Otherwise, I can obtain your verbal consent now.  All virtual visits are billed to your insurance company just like a normal visit would be.  By agreeing to a virtual visit, we'd like you to understand that the technology does not allow for your provider to perform an examination, and thus may limit your provider's ability to fully assess your condition. If your provider identifies any concerns that need to be evaluated in person, we will make arrangements to do so.  Finally, though the technology is pretty good, we cannot assure that it will always work on either your or our end, and in the setting of a video visit, we may have to convert it to a phone-only visit.  In either situation, we cannot ensure that we have a secure connection.  Are you willing to proceed?" STAFF: Did the patient verbally acknowledge consent to telehealth visit? Document YES/NO here: *YES   1. Advise patient to be prepared - "Two hours prior to your appointment, go ahead and check your blood pressure, pulse, oxygen saturation, and your weight (if you have the equipment to check those) and write them all down. When your visit starts, your provider will ask you for this information. If you have an Apple Watch or Kardia device, please plan to have heart rate information ready on the day of your appointment. Please have a pen and paper handy nearby the day of the visit as well."  2. Give patient instructions for MyChart download to smartphone OR [email or web address] as below if video visit (depending on what platform provider is using)  3. Inform patient they will  receive a phone call 15 minutes prior to their appointment time (may be from unknown caller ID) so they should be prepared to answer    TELEPHONE CALL NOTE  [name] has been deemed a candidate for a follow-up tele-health visit to limit community exposure during the Covid-19 pandemic. I spoke with the patient via phone to ensure availability of phone/video source, confirm preferred email & phone number, and discuss instructions and expectations.  I reminded [name] to be prepared with any vital sign and/or heart rhythm information that could potentially be obtained via home monitoring, at the time of her visit. I reminded [name] to expect a phone call prior to her visit.  Oleta Mouse, CMA 04/16/2019 7:51 AM   INSTRUCTIONS FOR DOWNLOADING THE MYCHART APP TO SMARTPHONE  - The patient must first make sure to have activated MyChart and know their login information - If Apple, go to [employer] and type in MyChart in the search bar and download the app. If Android, ask patient to go to [employer] and type in [place] in the search bar and download the app. The app is free but as with any other app downloads, their phone may require them to verify saved payment information or Apple/Android password.  - The patient will need to then log into the app with their MyChart username and password, and select [facility] as their healthcare  provider to link the account. When it is time for your visit, go to the MyChart app, find appointments, and click Begin Video Visit. Be sure to Select Allow for your device to access the Microphone and Camera for your visit. You will then be connected, and your provider will be with you shortly.  **If they have any issues connecting, or need assistance please contact MyChart service desk [phone])**  **If using a computer, in order to ensure the best quality for their visit they will need to use either of the following  Internet Browsers: [employer], or Google Chrome**  IF USING DOXIMITY or DOXY.ME - The patient will receive a link just prior to their visit by text.     FULL LENGTH CONSENT FOR TELE-HEALTH VISIT   I hereby voluntarily request, consent and authorize [facility] and its employed or contracted physicians, physician assistants, nurse practitioners or other licensed health care professionals (the Practitioner), to provide me with telemedicine health care services (the "Services") as deemed necessary by the treating Practitioner. I acknowledge and consent to receive the Services by the Practitioner via telemedicine. I understand that the telemedicine visit will involve communicating with the Practitioner through live audiovisual communication technology and the disclosure of certain medical information by electronic transmission. I acknowledge that I have been given the opportunity to request an in-person assessment or other available alternative prior to the telemedicine visit and am voluntarily participating in the telemedicine visit.  I understand that I have the right to withhold or withdraw my consent to the use of telemedicine in the course of my care at any time, without affecting my right to future care or treatment, and that the Practitioner or I may terminate the telemedicine visit at any time. I understand that I have the right to inspect all information obtained and/or recorded in the course of the telemedicine visit and may receive copies of available information for a reasonable fee.  I understand that some of the potential risks of receiving the Services via telemedicine include:  Marland Kitchen Delay or interruption in medical evaluation due to technological equipment failure or disruption; . Information transmitted may not be sufficient (e.g. poor resolution of images) to allow for appropriate medical decision making by the Practitioner; and/or  . In rare instances, security protocols could fail,  causing a breach of personal health information.  Furthermore, I acknowledge that it is my responsibility to provide information about my medical history, conditions and care that is complete and accurate to the best of my ability. I acknowledge that Practitioner's advice, recommendations, and/or decision may be based on factors not within their control, such as incomplete or inaccurate data provided by me or distortions of diagnostic images or specimens that may result from electronic transmissions. I understand that the practice of medicine is not an exact science and that Practitioner makes no warranties or guarantees regarding treatment outcomes. I acknowledge that I will receive a copy of this consent concurrently upon execution via email to the email address I last provided but may also request a printed copy by calling the office of [facility].    I understand that my insurance will be billed for this visit.   I have read or had this consent read to me. . I understand the contents of this consent, which adequately explains the benefits and risks of the Services being provided via telemedicine.  . I have been provided ample opportunity to ask questions regarding this consent and the Services and have  had my questions answered to my satisfaction. . I give my informed consent for the services to be provided through the use of telemedicine in my medical care  By participating in this telemedicine visit I agree to the above.

## 2019-04-17 ENCOUNTER — Telehealth: Payer: Self-pay | Admitting: *Deleted

## 2019-04-17 ENCOUNTER — Other Ambulatory Visit: Payer: Self-pay

## 2019-04-17 ENCOUNTER — Encounter: Payer: Self-pay | Admitting: Physician Assistant

## 2019-04-17 ENCOUNTER — Telehealth (INDEPENDENT_AMBULATORY_CARE_PROVIDER_SITE_OTHER): Payer: Medicare Other | Admitting: Physician Assistant

## 2019-04-17 VITALS — BP 182/89 | HR 88 | Ht 60.0 in | Wt 186.0 lb

## 2019-04-17 DIAGNOSIS — I2510 Atherosclerotic heart disease of native coronary artery without angina pectoris: Secondary | ICD-10-CM

## 2019-04-17 DIAGNOSIS — I10 Essential (primary) hypertension: Secondary | ICD-10-CM | POA: Diagnosis not present

## 2019-04-17 DIAGNOSIS — I350 Nonrheumatic aortic (valve) stenosis: Secondary | ICD-10-CM | POA: Diagnosis not present

## 2019-04-17 DIAGNOSIS — E785 Hyperlipidemia, unspecified: Secondary | ICD-10-CM | POA: Diagnosis not present

## 2019-04-17 DIAGNOSIS — R5383 Other fatigue: Secondary | ICD-10-CM

## 2019-04-17 DIAGNOSIS — Z7189 Other specified counseling: Secondary | ICD-10-CM

## 2019-04-17 NOTE — Patient Instructions (Signed)
Medication Instructions:   If you need a refill on your cardiac medications before your next appointment, please call your pharmacy.   Lab work:  Fasting CMET, CBC, TSH, Lipds in the next 2 weeks  If you have labs (blood work) drawn today and your tests are completely normal, you will receive your results only by: Marland Kitchen MyChart Message (if you have MyChart) OR . A paper copy in the mail If you have any lab test that is abnormal or we need to change your treatment, we will call you to review the results.  Testing/Procedures:  NONE ORDERED  TODAY    Follow-Up: At [facility], you and your health needs are our priority.  As part of our continuing mission to provide you with exceptional heart care, we have created designated Provider Care Teams.  These Care Teams include your primary Cardiologist (physician) and Advanced Practice Providers (APPs -  Physician Assistants and Nurse Practitioners) who all work together to provide you with the care you need, when you need it. You will need a follow up appointment in:  4 months.  Please call our office 2 months in advance to schedule this appointment.  You may see Sherren Mocha, MD  or one of the following Advanced Practice Providers on your designated Care Team: Richardson Dopp, PA-C [place], [place] . Daune Perch, NP  Any Other Special Instructions Will Be Listed Below (If Applicable).

## 2019-04-17 NOTE — Telephone Encounter (Signed)
Lvm to call clinic back to schedule fasting labs and 4 months with Nicki Reaper

## 2019-04-21 NOTE — Telephone Encounter (Signed)
Called patient who stated she got my message and she's at beach and don't know when she coming back. Patient stated she really doesn't want to come in clinic until this virus stuff get under control.  Patient stated she's hanging up and shell call us don't call her about her appointments and labs.Shell handle it  whenever she returns.

## 2019-06-26 ENCOUNTER — Other Ambulatory Visit: Payer: Medicare Other | Admitting: *Deleted

## 2019-06-26 ENCOUNTER — Other Ambulatory Visit: Payer: Self-pay

## 2019-06-26 DIAGNOSIS — I2510 Atherosclerotic heart disease of native coronary artery without angina pectoris: Secondary | ICD-10-CM

## 2019-06-26 DIAGNOSIS — E785 Hyperlipidemia, unspecified: Secondary | ICD-10-CM

## 2019-06-26 DIAGNOSIS — R5383 Other fatigue: Secondary | ICD-10-CM

## 2019-06-26 LAB — LIPID PANEL
Chol/HDL Ratio: 3.5 ratio (ref 0.0–4.4)
Cholesterol, Total: 169 mg/dL (ref 100–199)
HDL: 48 mg/dL (ref >39)
LDL Calculated: 90 mg/dL (ref 0–99)
Triglycerides: 157 mg/dL — ABNORMAL HIGH (ref 0–149)
VLDL Cholesterol Cal: 31 mg/dL (ref 5–40)

## 2019-06-26 LAB — COMPREHENSIVE METABOLIC PANEL
ALT: 13 IU/L (ref 0–32)
AST: 16 IU/L (ref 0–40)
Albumin/Globulin Ratio: 1.9 (ref 1.2–2.2)
Albumin: 4.6 g/dL (ref 3.8–4.8)
Alkaline Phosphatase: 71 IU/L (ref 39–117)
BUN/Creatinine Ratio: 13 (ref 12–28)
BUN: 11 mg/dL (ref 8–27)
Bilirubin Total: 0.4 mg/dL (ref 0.0–1.2)
CO2: 20 mmol/L (ref 20–29)
Calcium: 9.4 mg/dL (ref 8.7–10.3)
Chloride: 101 mmol/L (ref 96–106)
Creatinine, Ser: 0.85 mg/dL (ref 0.57–1.00)
GFR calc Af Amer: 81 mL/min/{1.73_m2} (ref >59)
GFR calc non Af Amer: 71 mL/min/{1.73_m2} (ref >59)
Globulin, Total: 2.4 g/dL (ref 1.5–4.5)
Glucose: 141 mg/dL — ABNORMAL HIGH (ref 65–99)
Potassium: 3.8 mmol/L (ref 3.5–5.2)
Sodium: 142 mmol/L (ref 134–144)
Total Protein: 7 g/dL (ref 6.0–8.5)

## 2019-06-26 LAB — CBC
Hematocrit: 42.4 % (ref 34.0–46.6)
Hemoglobin: 14 g/dL (ref 11.1–15.9)
MCH: 30 pg (ref 26.6–33.0)
MCHC: 33 g/dL (ref 31.5–35.7)
MCV: 91 fL (ref 79–97)
Platelets: 324 10*3/uL (ref 150–450)
RBC: 4.67 x10E6/uL (ref 3.77–5.28)
RDW: 12.5 % (ref 11.7–15.4)
WBC: 9.7 10*3/uL (ref 3.4–10.8)

## 2019-06-26 LAB — TSH: TSH: 0.02 u[IU]/mL — ABNORMAL LOW (ref 0.450–4.500)

## 2019-07-17 ENCOUNTER — Ambulatory Visit: Payer: Medicare Other

## 2019-07-24 ENCOUNTER — Other Ambulatory Visit: Payer: Self-pay | Admitting: Cardiovascular Disease

## 2019-07-24 ENCOUNTER — Other Ambulatory Visit: Payer: Self-pay | Admitting: Nurse Practitioner

## 2019-09-11 ENCOUNTER — Other Ambulatory Visit: Payer: Self-pay | Admitting: Cardiology

## 2019-10-21 ENCOUNTER — Other Ambulatory Visit: Payer: Self-pay | Admitting: Nurse Practitioner

## 2020-04-08 ENCOUNTER — Other Ambulatory Visit: Payer: Self-pay

## 2020-04-08 ENCOUNTER — Encounter (HOSPITAL_BASED_OUTPATIENT_CLINIC_OR_DEPARTMENT_OTHER): Payer: Self-pay

## 2020-04-08 ENCOUNTER — Emergency Department (HOSPITAL_BASED_OUTPATIENT_CLINIC_OR_DEPARTMENT_OTHER): Payer: No Typology Code available for payment source

## 2020-04-08 ENCOUNTER — Emergency Department (HOSPITAL_BASED_OUTPATIENT_CLINIC_OR_DEPARTMENT_OTHER)
Admission: EM | Admit: 2020-04-08 | Discharge: 2020-04-08 | Disposition: A | Discharge status: Home / Self Care | Payer: No Typology Code available for payment source | Attending: Emergency Medicine | Admitting: Emergency Medicine

## 2020-04-08 DIAGNOSIS — Z79899 Other long term (current) drug therapy: Secondary | ICD-10-CM | POA: Insufficient documentation

## 2020-04-08 DIAGNOSIS — I2510 Atherosclerotic heart disease of native coronary artery without angina pectoris: Secondary | ICD-10-CM | POA: Insufficient documentation

## 2020-04-08 DIAGNOSIS — E039 Hypothyroidism, unspecified: Secondary | ICD-10-CM | POA: Diagnosis not present

## 2020-04-08 DIAGNOSIS — I219 Acute myocardial infarction, unspecified: Secondary | ICD-10-CM | POA: Insufficient documentation

## 2020-04-08 DIAGNOSIS — S161XXD Strain of muscle, fascia and tendon at neck level, subsequent encounter: Secondary | ICD-10-CM | POA: Diagnosis not present

## 2020-04-08 DIAGNOSIS — Z7982 Long term (current) use of aspirin: Secondary | ICD-10-CM | POA: Diagnosis not present

## 2020-04-08 DIAGNOSIS — Z882 Allergy status to sulfonamides status: Secondary | ICD-10-CM | POA: Insufficient documentation

## 2020-04-08 DIAGNOSIS — I119 Hypertensive heart disease without heart failure: Secondary | ICD-10-CM | POA: Diagnosis not present

## 2020-04-08 DIAGNOSIS — Z951 Presence of aortocoronary bypass graft: Secondary | ICD-10-CM | POA: Diagnosis not present

## 2020-04-08 DIAGNOSIS — J449 Chronic obstructive pulmonary disease, unspecified: Secondary | ICD-10-CM | POA: Insufficient documentation

## 2020-04-08 DIAGNOSIS — R202 Paresthesia of skin: Secondary | ICD-10-CM | POA: Diagnosis present

## 2020-04-08 DIAGNOSIS — J45909 Unspecified asthma, uncomplicated: Secondary | ICD-10-CM | POA: Insufficient documentation
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
## 2020-04-08 NOTE — ED Notes (Signed)
ED Provider at bedside. 
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
## 2020-07-05 ENCOUNTER — Other Ambulatory Visit (HOSPITAL_COMMUNITY): Payer: Self-pay | Admitting: Orthopedic Surgery

## 2020-07-05 ENCOUNTER — Other Ambulatory Visit: Payer: Self-pay | Admitting: Orthopedic Surgery

## 2020-07-05 DIAGNOSIS — T84032D Mechanical loosening of internal right knee prosthetic joint, subsequent encounter: Secondary | ICD-10-CM

## 2020-07-12 ENCOUNTER — Other Ambulatory Visit (HOSPITAL_COMMUNITY): Payer: Medicare Other

## 2020-07-12 ENCOUNTER — Ambulatory Visit (HOSPITAL_COMMUNITY): Payer: No Typology Code available for payment source

## 2020-07-15 ENCOUNTER — Other Ambulatory Visit: Payer: Self-pay

## 2020-07-15 ENCOUNTER — Encounter (HOSPITAL_COMMUNITY)
Admission: RE | Admit: 2020-07-15 | Discharge: 2020-07-15 | Disposition: A | Discharge status: Home / Self Care | Payer: Medicare Other | Source: Ambulatory Visit | Attending: Orthopedic Surgery | Admitting: Orthopedic Surgery

## 2020-07-15 ENCOUNTER — Ambulatory Visit (HOSPITAL_COMMUNITY)
Admission: RE | Admit: 2020-07-15 | Discharge: 2020-07-15 | Disposition: A | Discharge status: Home / Self Care | Payer: Medicare Other | Source: Ambulatory Visit | Attending: Orthopedic Surgery | Admitting: Orthopedic Surgery

## 2020-07-15 DIAGNOSIS — T84032D Mechanical loosening of internal right knee prosthetic joint, subsequent encounter: Secondary | ICD-10-CM | POA: Insufficient documentation

## 2020-07-15 MED ORDER — TECHNETIUM TC 99M MEDRONATE IV KIT
20.0000 | PACK | Freq: Once | INTRAVENOUS | Status: AC | PRN
  Administered 2020-07-15: 20 via INTRAVENOUS

## 2020-08-26 ENCOUNTER — Other Ambulatory Visit: Payer: Self-pay

## 2020-08-26 ENCOUNTER — Encounter: Payer: Self-pay | Admitting: Physician Assistant

## 2020-08-26 ENCOUNTER — Ambulatory Visit (INDEPENDENT_AMBULATORY_CARE_PROVIDER_SITE_OTHER): Payer: Medicare Other | Admitting: Physician Assistant

## 2020-08-26 VITALS — BP 118/64 | HR 80 | Ht 60.0 in | Wt 170.2 lb

## 2020-08-26 DIAGNOSIS — R1110 Vomiting, unspecified: Secondary | ICD-10-CM

## 2020-08-26 DIAGNOSIS — I350 Nonrheumatic aortic (valve) stenosis: Secondary | ICD-10-CM

## 2020-08-26 DIAGNOSIS — E785 Hyperlipidemia, unspecified: Secondary | ICD-10-CM | POA: Diagnosis not present

## 2020-08-26 DIAGNOSIS — I2510 Atherosclerotic heart disease of native coronary artery without angina pectoris: Secondary | ICD-10-CM

## 2020-08-26 DIAGNOSIS — I10 Essential (primary) hypertension: Secondary | ICD-10-CM

## 2020-08-26 DIAGNOSIS — R5383 Other fatigue: Secondary | ICD-10-CM

## 2020-08-26 NOTE — Patient Instructions (Addendum)
Medication Instructions:  Your physician has recommended you make the following change in your medication:  1  STOP the Losartan  *If you need a refill on your cardiac medications before your next appointment, please call your pharmacy*   Lab Work: TODAY:  STAT CBC, STAT CMP, & STAT PRO BNP  If you have labs (blood work) drawn today and your tests are completely normal, you will receive your results only by: Marland Kitchen MyChart Message (if you have MyChart) OR . A paper copy in the mail If you have any lab test that is abnormal or we need to change your treatment, we will call you to review the results.   Testing/Procedures: Your physician has requested that you have an echocardiogram COME Monday 08/29/2020 AT 9:15. Echocardiography is a painless test that uses sound waves to create images of your heart. It provides your doctor with information about the size and shape of your heart and how well your heart's chambers and valves are working. This procedure takes approximately one hour. There are no restrictions for this procedure.     Follow-Up: At [facility], you and your health needs are our priority.  As part of our continuing mission to provide you with exceptional heart care, we have created designated Provider Care Teams.  These Care Teams include your primary Cardiologist (physician) and Advanced Practice Providers (APPs -  Physician Assistants and Nurse Practitioners) who all work together to provide you with the care you need, when you need it.  We recommend signing up for the patient portal called "MyChart".  Sign up information is provided on this After Visit Summary.  MyChart is used to connect with patients for Virtual Visits (Telemedicine).  Patients are able to view lab/test results, encounter notes, upcoming appointments, etc.  Non-urgent messages can be sent to your provider as well.   To learn more about what you can do with MyChart, go to [email or web address].    Your next  appointment:   1 month(s)  The format for your next appointment:   In Person  Provider:   You may see Tonny Bollman, MD or one of the following Advanced Practice Providers on your designated Care Team:    Tereso Newcomer, PA-C  Chelsea Aus, [place]    Other Instructions  Echocardiogram An echocardiogram is a procedure that uses painless sound waves (ultrasound) to produce an image of the heart. Images from an echocardiogram can provide important information about:  Signs of coronary artery disease (CAD).  Aneurysm detection. An aneurysm is a weak or damaged part of an artery wall that bulges out from the normal force of blood pumping through the body.  Heart size and shape. Changes in the size or shape of the heart can be associated with certain conditions, including heart failure, aneurysm, and CAD.  Heart muscle function.  Heart valve function.  Signs of a past heart attack.  Fluid buildup around the heart.  Thickening of the heart muscle.  A tumor or infectious growth around the heart valves. Tell a health care provider about:  Any allergies you have.  All medicines you are taking, including vitamins, herbs, eye drops, creams, and over-the-counter medicines.  Any blood disorders you have.  Any surgeries you have had.  Any medical conditions you have.  Whether you are pregnant or may be pregnant. What are the risks? Generally, this is a safe procedure. However, problems may occur, including:  Allergic reaction to dye (contrast) that may be used during the procedure. What happens  before the procedure? No specific preparation is needed. You may eat and drink normally. What happens during the procedure?   An IV tube may be inserted into one of your veins.  You may receive contrast through this tube. A contrast is an injection that improves the quality of the pictures from your heart.  A gel will be applied to your chest.  A wand-like tool (transducer) will be  moved over your chest. The gel will help to transmit the sound waves from the transducer.  The sound waves will harmlessly bounce off of your heart to allow the heart images to be captured in real-time motion. The images will be recorded on a computer. The procedure may vary among health care providers and hospitals. What happens after the procedure?  You may return to your normal, everyday life, including diet, activities, and medicines, unless your health care provider tells you not to do that. Summary  An echocardiogram is a procedure that uses painless sound waves (ultrasound) to produce an image of the heart.  Images from an echocardiogram can provide important information about the size and shape of your heart, heart muscle function, heart valve function, and fluid buildup around your heart.  You do not need to do anything to prepare before this procedure. You may eat and drink normally.  After the echocardiogram is completed, you may return to your normal, everyday life, unless your health care provider tells you not to do that. This information is not intended to replace advice given to you by your health care provider. Make sure you discuss any questions you have with your health care provider. Document Revised: 02/12/2019 Document Reviewed: 11/24/2016 Elsevier Patient Education  2020 [employer].

## 2020-08-26 NOTE — Progress Notes (Signed)
Cardiology Office Note:    Date:  08/26/2020   ID:  [name], DOB 10-03-1951, MRN [id number]  PCP:  Imagene Riches, NP  [facility] Cardiologist:  Sherren Mocha, MD  [place] Electrophysiologist:  None   Chief Complaint: Hospital follow up   History of Present Illness:    [name] is a 69 y.o. female with a hx of CAD, aortic stenosis, HTN, HLD, stroke and COPD seen for hospital follow up.   Hx of CAD s/p DES x 2 to RCA in 2007 s/p multivessel CABG 2013.  She most recently presented with non-STEMI in 2017 and was found to have occlusion of all of her vein grafts.  There is continued patency of the LIMA to LAD.  Attempted PCI none of 1 of the vein grafts was performed, but unsuccessful in restoring flow.  Low risk stress test 10/2018. Echo 10/2018 showed LVEF of 50-55% and mild AS with mean gradient of 9 mm Hg.   Admitted to [facility] 08/2020 for AKI 2nd to obstructive left renal calculus and underwent ureteral stent placement.  Her presenting symptoms was nausea, vomiting, diarrhea and back pain.  Had mild chest discomfort but troponin with mild flat trend. Outpatient cardiac work up recommended.   Here today for follow up with husband.  Patient continues to have nausea, vomiting and diarrhea since discharge which was her presenting in terms while admitted.  Symptoms may have improved but not significantly.  Patient is tired and fatigued.  Patient reported 3 episodes of syncope.  Each occurred at bedside while standing.  Occasional felt dizzy.  Unwitnessed.  Her diuretics were discontinued at discharge.  Denies orthopnea or PND.  Intermittent chest discomfort.  Patient is very poor historian.  Past Medical History:  Diagnosis Date  . ALLERGIC RHINITIS   . ANEMIA-IRON DEFICIENCY   . ANXIETY   . ASTHMA   . BREAST MASS, LEFT   . CAD (coronary artery disease)    1. s/p inf MI 2007 c/b low BP and brady, req temp pacer and pressors;  tx with DES x 2 to RCA;  2.  inf STEMI 10/12 - PL occluded but small, med Rx recommended;  3.  NSTEMI 01/2012 - LHC with 3v CAD adn EF 45%; Echo with mild LVH, inf HK, EF 55-605, grade 1 diast dysfxn, mild AS;  4. s/p CABG 02/04/12: L-LAD, S-RI, S-OM1, S-PD/PLVB ([place]) 5. NSTEMI 09/11/16 patent LIMA to LAD, EF 55-60%  . COLONIC POLYPS, HX OF   . COPD   . DIVERTICULITIS, HX OF   . FATIGUE, CHRONIC   . FIBROMYALGIA   . GERD   . GLUCOSE INTOLERANCE   . HYPERLIPIDEMIA 08/23/2007  . HYPERTENSION 08/23/2007  . HYPOTHYROIDISM   . IBS   . LOW BACK PAIN   . MENOPAUSAL DISORDER   . OBESITY   . OSTEOARTHRITIS   . PERIPHERAL EDEMA   . PERIPHERAL VASCULAR DISEASE 07/15/2009  . RENAL INSUFFICIENCY 03/02/2009  . Somatization disorder 08/23/2007  . Stroke [facility])     Past Surgical History:  Procedure Laterality Date  . ABDOMINAL HYSTERECTOMY    . APPENDECTOMY    . CARDIAC CATHETERIZATION    . CARDIAC CATHETERIZATION N/A 09/11/2016   Procedure: Left Heart Cath and Coronary Angiography;  Surgeon: Sherren Mocha, MD;  Location: [place] CV LAB;  Service: Cardiovascular;  Laterality: N/A;  . CARDIAC CATHETERIZATION N/A 09/11/2016   Procedure: Coronary Balloon Angioplasty;  Surgeon: Sherren Mocha, MD;  Location: [facility] INVASIVE CV  LAB;  Service: Cardiovascular;  Laterality: N/A;  . CHOLECYSTECTOMY    . COLON SURGERY    . CORONARY ANGIOPLASTY    . CORONARY ARTERY BYPASS GRAFT  02/04/2012   Procedure: CORONARY ARTERY BYPASS GRAFTING (CABG);  Surgeon: Melrose Nakayama, MD;  Location: [place];  Service: Open Heart Surgery;  Laterality: N/A;  CABG x five  using left internal mammary artery and right leg greater saphenous vein harvested endoscopically  . CORONARY STENT PLACEMENT    . LEFT HEART CATHETERIZATION WITH CORONARY ANGIOGRAM N/A 01/30/2012   Procedure: LEFT HEART CATHETERIZATION WITH CORONARY ANGIOGRAM;  Surgeon: Burnell Blanks, MD;  Location: [facility] CATH LAB;  Service: Cardiovascular;  Laterality: N/A;  . OOPHORECTOMY      . REPLACEMENT TOTAL KNEE    . TONSILLECTOMY      Current Medications: Current Meds  Medication Sig  . albuterol (PROVENTIL HFA;VENTOLIN HFA) 108 (90 BASE) MCG/ACT inhaler Inhale 2 puffs into the lungs every 6 (six) hours as needed. Shortness of breath  . amLODipine (NORVASC) 10 MG tablet Take 1 tablet by mouth once daily  . aspirin 81 MG tablet Take 1 tablet (81 mg total) daily by mouth.  . carvedilol (COREG) 25 MG tablet Take 25 mg by mouth daily.  . clopidogrel (PLAVIX) 75 MG tablet Take 1 tablet by mouth once daily  . diphenoxylate-atropine (LOMOTIL) 2.5-0.025 MG tablet   . furosemide (LASIX) 40 MG tablet Take 40 mg by mouth daily as needed for fluid or edema (only when needed for swelling or 3lbs weight gain for a day).  Marland Kitchen levothyroxine (SYNTHROID) 112 MCG tablet Take 112 mcg by mouth daily.  . Loratadine-Pseudoephedrine (CLARITIN-D 24 HOUR PO) Take 1 tablet by mouth daily.  . nitrofurantoin, macrocrystal-monohydrate, (MACROBID) 100 MG capsule Take 100 mg by mouth 2 (two) times daily.  . nitroGLYCERIN (NITROSTAT) 0.4 MG SL tablet Place 0.4 mg under the tongue every 5 (five) minutes as needed for chest pain.  Marland Kitchen nystatin (MYCOSTATIN/NYSTOP) powder Apply 1 application 4 (four) times daily as needed topically. Skin irritation  . pantoprazole (PROTONIX) 40 MG tablet Take 1 tablet by mouth twice daily  . potassium chloride SA (K-DUR,KLOR-CON) 20 MEQ tablet Take 20 mEq by mouth as directed.  . rosuvastatin (CRESTOR) 40 MG tablet Take 1 tablet (40 mg total) by mouth daily.  . tamsulosin (FLOMAX) 0.4 MG CAPS capsule Take 0.4 mg by mouth daily.  . traMADol (ULTRAM) 50 MG tablet Take 50 mg 2 (two) times daily as needed by mouth. Headache  . [DISCONTINUED] levothyroxine (SYNTHROID, LEVOTHROID) 125 MCG tablet Take 125 mcg by mouth daily before breakfast.  . [DISCONTINUED] losartan (COZAAR) 100 MG tablet Take 100 mg by mouth daily.     Allergies:   Sulfonamide derivatives   Social History    Socioeconomic History  . Marital status: Married    Spouse name: Not on file  . Number of children: Not on file  . Years of education: Not on file  . Highest education level: Not on file  Occupational History  . Not on file  Tobacco Use  . Smoking status: Never Smoker  . Smokeless tobacco: Never Used  Vaping Use  . Vaping Use: Never used  Substance and Sexual Activity  . Alcohol use: No  . Drug use: No  . Sexual activity: Not on file  Other Topics Concern  . Not on file  Social History Narrative  . Not on file   Social Determinants of Health   Financial Resource Strain:   .  Difficulty of Paying Living Expenses: Not on file  Food Insecurity:   . Worried About Charity fundraiser in the Last Year: Not on file  . Ran Out of Food in the Last Year: Not on file  Transportation Needs:   . Lack of Transportation (Medical): Not on file  . Lack of Transportation (Non-Medical): Not on file  Physical Activity:   . Days of Exercise per Week: Not on file  . Minutes of Exercise per Session: Not on file  Stress:   . Feeling of Stress : Not on file  Social Connections:   . Frequency of Communication with Friends and Family: Not on file  . Frequency of Social Gatherings with Friends and Family: Not on file  . Attends Religious Services: Not on file  . Active Member of Clubs or Organizations: Not on file  . Attends Archivist Meetings: Not on file  . Marital Status: Not on file     Family History: The patient's family history is not on file.    ROS:   Please see the history of present illness.    All other systems reviewed and are negative.   EKGs/Labs/Other Studies Reviewed:    The following studies were reviewed today:  Echo 10/10/2018 Study Conclusions   - Left ventricle: The cavity size was normal. There was moderate  concentric hypertrophy. Systolic function was normal. The  estimated ejection fraction was in the range of 50% to 55%. There  is  akinesis and aneurysmal deformity of the basalinferior  myocardium. Doppler parameters are consistent with abnormal left  ventricular relaxation (grade 1 diastolic dysfunction).  - Aortic valve: Trileaflet; moderately thickened, moderately  calcified leaflets. Valve mobility was restricted. There was mild  stenosis. Peak velocity (S): 244 cm/s. Mean gradient (S): 9 mm  Hg. Valve area (VTI): 1.56 cm^2.  - Mitral valve: Moderately calcified annulus.  - Left atrium: The atrium was mildly dilated.   Impressions:   - Aortic stenosis has not advanced.   Stress test 10/24/2018  Nferior, inferolateral defect (base, mid) consistent with probable soft tissue attenuation, cannot exclude subendocardial scar. No ischemia  LVEF 73%  There was no ST segment deviation noted during stress.  EKG:  EKG is  ordered today.  The ekg ordered today demonstrates normal sinus rhythm at rate of 80 bpm  Recent Labs: No results found for requested labs within last 8760 hours.  Recent Lipid Panel    Component Value Date/Time   CHOL 169 06/26/2019 0838   TRIG 157 (H) 06/26/2019 0838   HDL 48 06/26/2019 0838   CHOLHDL 3.5 06/26/2019 0838   CHOLHDL 6.4 09/12/2016 0256   VLDL 56 (H) 09/12/2016 0256   LDLCALC 90 06/26/2019 0838   LDLDIRECT 168.9 08/22/2010 0840     Physical Exam:    VS:  BP 118/64   Pulse 80   Ht 5' (1.524 m)   Wt 170 lb 3.2 oz (77.2 kg)   SpO2 98%   BMI 33.24 kg/m     Wt Readings from Last 3 Encounters:  08/26/20 170 lb 3.2 oz (77.2 kg)  04/08/20 183 lb 4.8 oz (83.1 kg)  04/17/19 186 lb (84.4 kg)     GEN: Well nourished, well developed in no acute distress HEENT: Normal NECK: No JVD; No carotid bruits LYMPHATICS: No lymphadenopathy CARDIAC: RRR, 3/6 systolic murmurs, rubs, gallops RESPIRATORY:  Clear to auscultation without rales, wheezing or rhonchi  ABDOMEN: Soft, non-tender, non-distended MUSCULOSKELETAL:  No edema; No deformity  SKIN: Warm and  dry NEUROLOGIC:  Alert and oriented x 3 PSYCHIATRIC:  Normal affect   ASSESSMENT AND PLAN:    1. Syncope/dizziness Very poor history of syncope.  Could be due to orthostatic hypotension.  She was positive for orthostasis during admission however negative during office visit today.  She has mild aortic stenosis by echocardiogram in 2019.  I have reviewed her history with Dr. Burt Knack today.  His symptoms more sounds like orthostasis rather than true cardiac issue.  Will get echocardiogram for further evaluation.  2.  CAD -Last cardiac catheterization in 2017 as above -Mild chest discomfort intermittently -Does not sound like typical angina.  No change in medical therapy  3.  Fatigue, nausea, vomiting or diarrhea -Seems her symptoms likely due to dehydration and still recovering for UTI. -Check labs -Encourage small meal and fluid -Encouraged to follow-up with PCP  4.  Hypertension -Not orthostatic during office visit -Continue Norvasc 10 mg daily and carvedilol 25 mg twice daily -Stop losartan, IF BP above 150/100 persistently she will restart losartan at half a dose at 50 mg daily   She will go to emergency room if worsening symptoms  Medication Adjustments/Labs and Tests Ordered: Current medicines are reviewed at length with the patient today.  Concerns regarding medicines are outlined above.  Orders Placed This Encounter  Procedures  . CBC  . Comp Met (CMET)  . Pro b natriuretic peptide (BNP)  . EKG 12-Lead  . ECHOCARDIOGRAM COMPLETE   No orders of the defined types were placed in this encounter.   Patient Instructions  Medication Instructions:  Your physician has recommended you make the following change in your medication:  1  STOP the Losartan  *If you need a refill on your cardiac medications before your next appointment, please call your pharmacy*   Lab Work: TODAY:  STAT CBC, STAT CMP, & STAT PRO BNP  If you have labs (blood work) drawn today and your tests  are completely normal, you will receive your results only by: Marland Kitchen MyChart Message (if you have MyChart) OR . A paper copy in the mail If you have any lab test that is abnormal or we need to change your treatment, we will call you to review the results.   Testing/Procedures: Your physician has requested that you have an echocardiogram COME Monday 08/29/2020 AT 9:15. Echocardiography is a painless test that uses sound waves to create images of your heart. It provides your doctor with information about the size and shape of your heart and how well your heart's chambers and valves are working. This procedure takes approximately one hour. There are no restrictions for this procedure.     Follow-Up: At [facility], you and your health needs are our priority.  As part of our continuing mission to provide you with exceptional heart care, we have created designated Provider Care Teams.  These Care Teams include your primary Cardiologist (physician) and Advanced Practice Providers (APPs -  Physician Assistants and Nurse Practitioners) who all work together to provide you with the care you need, when you need it.  We recommend signing up for the patient portal called "MyChart".  Sign up information is provided on this After Visit Summary.  MyChart is used to connect with patients for Virtual Visits (Telemedicine).  Patients are able to view lab/test results, encounter notes, upcoming appointments, etc.  Non-urgent messages can be sent to your provider as well.   To learn more about what you can do with MyChart, go  to [email or web address].    Your next appointment:   1 month(s)  The format for your next appointment:   In Person  Provider:   You may see Sherren Mocha, MD or one of the following Advanced Practice Providers on your designated Care Team:    Richardson Dopp, PA-C  Robbie Lis, [place]    Other Instructions  Echocardiogram An echocardiogram is a procedure that uses painless sound  waves (ultrasound) to produce an image of the heart. Images from an echocardiogram can provide important information about:  Signs of coronary artery disease (CAD).  Aneurysm detection. An aneurysm is a weak or damaged part of an artery wall that bulges out from the normal force of blood pumping through the body.  Heart size and shape. Changes in the size or shape of the heart can be associated with certain conditions, including heart failure, aneurysm, and CAD.  Heart muscle function.  Heart valve function.  Signs of a past heart attack.  Fluid buildup around the heart.  Thickening of the heart muscle.  A tumor or infectious growth around the heart valves. Tell a health care provider about:  Any allergies you have.  All medicines you are taking, including vitamins, herbs, eye drops, creams, and over-the-counter medicines.  Any blood disorders you have.  Any surgeries you have had.  Any medical conditions you have.  Whether you are pregnant or may be pregnant. What are the risks? Generally, this is a safe procedure. However, problems may occur, including:  Allergic reaction to dye (contrast) that may be used during the procedure. What happens before the procedure? No specific preparation is needed. You may eat and drink normally. What happens during the procedure?   An IV tube may be inserted into one of your veins.  You may receive contrast through this tube. A contrast is an injection that improves the quality of the pictures from your heart.  A gel will be applied to your chest.  A wand-like tool (transducer) will be moved over your chest. The gel will help to transmit the sound waves from the transducer.  The sound waves will harmlessly bounce off of your heart to allow the heart images to be captured in real-time motion. The images will be recorded on a computer. The procedure may vary among health care providers and hospitals. What happens after the  procedure?  You may return to your normal, everyday life, including diet, activities, and medicines, unless your health care provider tells you not to do that. Summary  An echocardiogram is a procedure that uses painless sound waves (ultrasound) to produce an image of the heart.  Images from an echocardiogram can provide important information about the size and shape of your heart, heart muscle function, heart valve function, and fluid buildup around your heart.  You do not need to do anything to prepare before this procedure. You may eat and drink normally.  After the echocardiogram is completed, you may return to your normal, everyday life, unless your health care provider tells you not to do that. This information is not intended to replace advice given to you by your health care provider. Make sure you discuss any questions you have with your health care provider. Document Revised: 02/12/2019 Document Reviewed: 11/24/2016 Elsevier Patient Education  [place], [place], [place]  08/26/2020 4:07 PM    [facility]

## 2020-08-27 LAB — COMPREHENSIVE METABOLIC PANEL WITH GFR
ALT: 27 [IU]/L (ref 0–32)
AST: 28 [IU]/L (ref 0–40)
Albumin/Globulin Ratio: 1.5 (ref 1.2–2.2)
Albumin: 3.4 g/dL — ABNORMAL LOW (ref 3.8–4.8)
Alkaline Phosphatase: 67 [IU]/L (ref 44–121)
BUN/Creatinine Ratio: 9 — ABNORMAL LOW (ref 12–28)
BUN: 8 mg/dL (ref 8–27)
Bilirubin Total: 0.3 mg/dL (ref 0.0–1.2)
CO2: 17 mmol/L — ABNORMAL LOW (ref 20–29)
Calcium: 8.8 mg/dL (ref 8.7–10.3)
Chloride: 110 mmol/L — ABNORMAL HIGH (ref 96–106)
Creatinine, Ser: 0.94 mg/dL (ref 0.57–1.00)
GFR calc Af Amer: 72 mL/min/{1.73_m2}
GFR calc non Af Amer: 62 mL/min/{1.73_m2}
Globulin, Total: 2.2 g/dL (ref 1.5–4.5)
Glucose: 113 mg/dL — ABNORMAL HIGH (ref 65–99)
Potassium: 3.6 mmol/L (ref 3.5–5.2)
Sodium: 143 mmol/L (ref 134–144)
Total Protein: 5.6 g/dL — ABNORMAL LOW (ref 6.0–8.5)

## 2020-08-27 LAB — CBC
Hematocrit: 34.7 % (ref 34.0–46.6)
Hemoglobin: 11.5 g/dL (ref 11.1–15.9)
MCH: 29.7 pg (ref 26.6–33.0)
MCHC: 33.1 g/dL (ref 31.5–35.7)
MCV: 90 fL (ref 79–97)
Platelets: 284 10*3/uL (ref 150–450)
RBC: 3.87 x10E6/uL (ref 3.77–5.28)
RDW: 12.8 % (ref 11.7–15.4)
WBC: 6.5 10*3/uL (ref 3.4–10.8)

## 2020-08-27 LAB — PRO B NATRIURETIC PEPTIDE: NT-Pro BNP: 701 pg/mL — ABNORMAL HIGH (ref 0–301)

## 2020-08-29 ENCOUNTER — Telehealth: Payer: Self-pay

## 2020-08-29 ENCOUNTER — Ambulatory Visit (HOSPITAL_COMMUNITY): Payer: Medicare Other | Attending: Cardiology

## 2020-08-29 ENCOUNTER — Other Ambulatory Visit: Payer: Self-pay

## 2020-08-29 ENCOUNTER — Encounter (HOSPITAL_COMMUNITY): Payer: Self-pay | Admitting: Radiology

## 2020-08-29 DIAGNOSIS — I10 Essential (primary) hypertension: Secondary | ICD-10-CM | POA: Insufficient documentation

## 2020-08-29 DIAGNOSIS — I350 Nonrheumatic aortic (valve) stenosis: Secondary | ICD-10-CM | POA: Diagnosis not present

## 2020-08-29 DIAGNOSIS — I2510 Atherosclerotic heart disease of native coronary artery without angina pectoris: Secondary | ICD-10-CM | POA: Diagnosis not present

## 2020-08-29 DIAGNOSIS — E785 Hyperlipidemia, unspecified: Secondary | ICD-10-CM | POA: Insufficient documentation

## 2020-08-29 LAB — ECHOCARDIOGRAM COMPLETE
AR max vel: 1.07 cm2
AV Area VTI: 1.03 cm2
AV Area mean vel: 1.08 cm2
AV Mean grad: 14 mmHg
AV Peak grad: 21.3 mmHg
Ao pk vel: 2.31 m/s
Area-P 1/2: 2.8 cm2
P 1/2 time: 771 msec
S' Lateral: 2.55 cm

## 2020-08-29 NOTE — Telephone Encounter (Signed)
The patient has been notified of the result and verbalized understanding.  All questions (if any) were answered. Leanord Hawking, RN 08/29/2020 8:50 AM

## 2020-08-29 NOTE — Telephone Encounter (Signed)
-----   Message from [place], [place] sent at 08/29/2020  7:58 AM EDT ----- Electrolytes, hemoglobin and renal function stable. Fluid marker minimally up. Take lasix 20mg  qd as needed for le edema or shortness of breath.   Recommended follow up with PCP and forward copy of result.

## 2020-08-30 ENCOUNTER — Telehealth: Payer: Self-pay | Admitting: *Deleted

## 2020-08-30 NOTE — Telephone Encounter (Signed)
-----   Message from [place], [place] sent at 08/30/2020  7:43 AM EDT ----- Normal pumping function and mild stiffness of heart muscle. Mild aortic stenosis. This does not explain her symptoms. Forward copy to PCP.

## 2020-08-30 NOTE — Telephone Encounter (Signed)
Returned call to pt's husband, Brett Canales, [place] on file.  He has been made aware of pt's echo results.

## 2020-08-30 NOTE — Telephone Encounter (Signed)
Returned call to [place], [place] on file.  See other phone note.

## 2020-08-30 NOTE — Telephone Encounter (Signed)
Husband of patient calling about the patient's lab results. Please return call

## 2020-09-16 ENCOUNTER — Other Ambulatory Visit: Payer: Self-pay | Admitting: Nurse Practitioner

## 2020-09-16 ENCOUNTER — Other Ambulatory Visit: Payer: Self-pay | Admitting: Cardiovascular Disease

## 2020-10-14 ENCOUNTER — Encounter: Payer: Self-pay | Admitting: Physician Assistant

## 2020-10-14 ENCOUNTER — Ambulatory Visit (INDEPENDENT_AMBULATORY_CARE_PROVIDER_SITE_OTHER): Payer: Medicare Other | Admitting: Physician Assistant

## 2020-10-14 ENCOUNTER — Other Ambulatory Visit: Payer: Self-pay

## 2020-10-14 VITALS — BP 122/58 | HR 81 | Ht 60.0 in | Wt 169.4 lb

## 2020-10-14 DIAGNOSIS — R0601 Orthopnea: Secondary | ICD-10-CM | POA: Diagnosis not present

## 2020-10-14 DIAGNOSIS — E785 Hyperlipidemia, unspecified: Secondary | ICD-10-CM | POA: Diagnosis not present

## 2020-10-14 DIAGNOSIS — I2510 Atherosclerotic heart disease of native coronary artery without angina pectoris: Secondary | ICD-10-CM | POA: Diagnosis not present

## 2020-10-14 DIAGNOSIS — I10 Essential (primary) hypertension: Secondary | ICD-10-CM

## 2020-10-14 DIAGNOSIS — R42 Dizziness and giddiness: Secondary | ICD-10-CM

## 2020-10-14 NOTE — Progress Notes (Signed)
Cardiology Office Note:    Date:  10/14/2020   ID:  [name], DOB 1950-12-04, MRN [id number]  PCP:  Dema Severin, NP  [facility] Cardiologist:  Tonny Bollman, MD  [facility] Electrophysiologist:  None   Chief Complaint: 6 weeks follow up   History of Present Illness:    [name] is a 69 y.o. female with a hx of with a hx of CAD, mild aortic stenosis, HTN, HLD, stroke and COPD presents for follow up.   Hx of CAD s/p DES x 2 to RCA in 2007 s/p multivessel CABG 2013. She most recently presented with non-STEMI in 2017 and was found to have occlusion of all of her vein grafts. There is continued patency of the LIMA to LAD. Attempted PCI none of 1 of the vein grafts was performed, but unsuccessful in restoring flow. Low risk stress test 10/2018. Echo 10/2018 showed LVEF of 50-55% and mild AS with mean gradient of 9 mm Hg.   Admitted to [facility] 08/2020 for AKI 2nd to obstructive left renal calculus and underwent ureteral stent placement.  Her presenting symptoms was nausea, vomiting, diarrhea and back pain.  Had mild chest discomfort but troponin with mild flat trend. Outpatient cardiac work up recommended.   Seen by me 08/26/2020. She continues to have nausea, vomiting and diarrhea since discharge which was her presenting in terms while admitted. Had 3 syncope episode with dizziness with very poor hx. Felt symptoms likely due to orthostatics (likley due to dehydration) despite negative in clinic. Stopped losartan.   Echo 08/2020 showed normal pumping function and mild stiffness of heart muscle. Mild aortic stenosis.    Here today for follow up.  She still has intermittent dizziness sometimes standing up and sometimes during walking.  No associated chest pain, shortness of breath, palpitation or fluttering sensation.  She stopped her losartan and advised for few weeks then restarted back for unknown reason.  No difference in her symptoms.  She is very poor  historian.  She is eating high salt diet.  Occasional shortness of breath with laying down but no sustained PND.  Mild lower extremity edema.   Past Medical History:  Diagnosis Date  . ALLERGIC RHINITIS   . ANEMIA-IRON DEFICIENCY   . ANXIETY   . ASTHMA   . BREAST MASS, LEFT   . CAD (coronary artery disease)    1. s/p inf MI 2007 c/b low BP and brady, req temp pacer and pressors;  tx with DES x 2 to RCA;  2. inf STEMI 10/12 - PL occluded but small, med Rx recommended;  3.  NSTEMI 01/2012 - LHC with 3v CAD adn EF 45%; Echo with mild LVH, inf HK, EF 55-605, grade 1 diast dysfxn, mild AS;  4. s/p CABG 02/04/12: L-LAD, S-RI, S-OM1, S-PD/PLVB ([facility]) 5. NSTEMI 09/11/16 patent LIMA to LAD, EF 55-60%  . COLONIC POLYPS, HX OF   . COPD   . DIVERTICULITIS, HX OF   . FATIGUE, CHRONIC   . FIBROMYALGIA   . GERD   . GLUCOSE INTOLERANCE   . HYPERLIPIDEMIA 08/23/2007  . HYPERTENSION 08/23/2007  . HYPOTHYROIDISM   . IBS   . LOW BACK PAIN   . MENOPAUSAL DISORDER   . OBESITY   . OSTEOARTHRITIS   . PERIPHERAL EDEMA   . PERIPHERAL VASCULAR DISEASE 07/15/2009  . RENAL INSUFFICIENCY 03/02/2009  . Somatization disorder 08/23/2007  . Stroke [facility])     Past Surgical History:  Procedure Laterality Date  .  ABDOMINAL HYSTERECTOMY    . APPENDECTOMY    . CARDIAC CATHETERIZATION    . CARDIAC CATHETERIZATION N/A 09/11/2016   Procedure: Left Heart Cath and Coronary Angiography;  Surgeon: Tonny Bollman, MD;  Location: [facility] INVASIVE CV LAB;  Service: Cardiovascular;  Laterality: N/A;  . CARDIAC CATHETERIZATION N/A 09/11/2016   Procedure: Coronary Balloon Angioplasty;  Surgeon: Tonny Bollman, MD;  Location: [facility] INVASIVE CV LAB;  Service: Cardiovascular;  Laterality: N/A;  . CHOLECYSTECTOMY    . COLON SURGERY    . CORONARY ANGIOPLASTY    . CORONARY ARTERY BYPASS GRAFT  02/04/2012   Procedure: CORONARY ARTERY BYPASS GRAFTING (CABG);  Surgeon: Loreli Slot, MD;  Location: [facility] OR;  Service: Open Heart Surgery;   Laterality: N/A;  CABG x five  using left internal mammary artery and right leg greater saphenous vein harvested endoscopically  . CORONARY STENT PLACEMENT    . LEFT HEART CATHETERIZATION WITH CORONARY ANGIOGRAM N/A 01/30/2012   Procedure: LEFT HEART CATHETERIZATION WITH CORONARY ANGIOGRAM;  Surgeon: Kathleene Hazel, MD;  Location: [facility] CATH LAB;  Service: Cardiovascular;  Laterality: N/A;  . OOPHORECTOMY    . REPLACEMENT TOTAL KNEE    . TONSILLECTOMY      Current Medications: Current Meds  Medication Sig  . albuterol (PROVENTIL HFA;VENTOLIN HFA) 108 (90 BASE) MCG/ACT inhaler Inhale 2 puffs into the lungs every 6 (six) hours as needed. Shortness of breath  . amLODipine (NORVASC) 10 MG tablet Take 1 tablet by mouth once daily  . aspirin 81 MG tablet Take 1 tablet (81 mg total) daily by mouth.  . carvedilol (COREG) 25 MG tablet Take 25 mg by mouth daily.  . clopidogrel (PLAVIX) 75 MG tablet Take 1 tablet by mouth once daily  . diphenoxylate-atropine (LOMOTIL) 2.5-0.025 MG tablet   . furosemide (LASIX) 40 MG tablet Take 40 mg by mouth daily as needed for fluid or edema (only when needed for swelling or 3lbs weight gain for a day).  Marland Kitchen levothyroxine (SYNTHROID) 112 MCG tablet Take 112 mcg by mouth daily.  . Loratadine-Pseudoephedrine (CLARITIN-D 24 HOUR PO) Take 1 tablet by mouth daily.  . nitroGLYCERIN (NITROSTAT) 0.4 MG SL tablet Place 0.4 mg under the tongue every 5 (five) minutes as needed for chest pain.  Marland Kitchen nystatin (MYCOSTATIN/NYSTOP) powder Apply 1 application 4 (four) times daily as needed topically. Skin irritation  . pantoprazole (PROTONIX) 40 MG tablet Take 1 tablet by mouth twice daily  . potassium chloride SA (K-DUR,KLOR-CON) 20 MEQ tablet Take 20 mEq by mouth as needed (when takes lasix).  . tamsulosin (FLOMAX) 0.4 MG CAPS capsule Take 0.4 mg by mouth daily.  . traMADol (ULTRAM) 50 MG tablet Take 50 mg 2 (two) times daily as needed by mouth. Headache     Allergies:    Sulfonamide derivatives   Social History   Socioeconomic History  . Marital status: Married    Spouse name: Not on file  . Number of children: Not on file  . Years of education: Not on file  . Highest education level: Not on file  Occupational History  . Not on file  Tobacco Use  . Smoking status: Never Smoker  . Smokeless tobacco: Never Used  Vaping Use  . Vaping Use: Never used  Substance and Sexual Activity  . Alcohol use: No  . Drug use: No  . Sexual activity: Not on file  Other Topics Concern  . Not on file  Social History Narrative  . Not on file   Social Determinants  of Health   Financial Resource Strain: Not on file  Food Insecurity: Not on file  Transportation Needs: Not on file  Physical Activity: Not on file  Stress: Not on file  Social Connections: Not on file     Family History: The patient's family history is not on file.    ROS:   Please see the history of present illness.    All other systems reviewed and are negative.   EKGs/Labs/Other Studies Reviewed:    The following studies were reviewed today:  Echo 08/2020 1. Left ventricular ejection fraction, by estimation, is 55 to 60%. The  left ventricle has normal function. The left ventricle has no regional  wall motion abnormalities. There is moderate concentric left ventricular  hypertrophy. Left ventricular  diastolic parameters are consistent with Grade I diastolic dysfunction  (impaired relaxation). Elevated left atrial pressure. The average left  ventricular global longitudinal strain is -19.9 %. The global longitudinal  strain is normal.  2. Right ventricular systolic function is normal. The right ventricular  size is normal. The estimated right ventricular systolic pressure is 23.8  mmHg.  3. Left atrial size was moderately dilated.  4. The mitral valve is normal in structure. No evidence of mitral valve  regurgitation. No evidence of mitral stenosis. Moderate mitral annular   calcification.  5. The aortic valve is normal in structure. There is severe calcifcation  of the aortic valve. There is severe thickening of the aortic valve.  Aortic valve regurgitation is mild. Mild aortic valve stenosis. Aortic  valve mean gradient measures 14.0 mmHg.  6. The inferior vena cava is normal in size with greater than 50%  respiratory variability, suggesting right atrial pressure of 3 mmHg.    Stress test 10/2018  Nferior, inferolateral defect (base, mid) consistent with probable soft tissue attenuation, cannot exclude subendocardial scar. No ischemia  LVEF 73%  There was no ST segment deviation noted during stress.   EKG:  EKG is not ordered today.   Recent Labs: 08/26/2020: ALT 27; BUN 8; Creatinine, Ser 0.94; Hemoglobin 11.5; NT-Pro BNP 701; Platelets 284; Potassium 3.6; Sodium 143  Recent Lipid Panel    Component Value Date/Time   CHOL 169 06/26/2019 0838   TRIG 157 (H) 06/26/2019 0838   HDL 48 06/26/2019 0838   CHOLHDL 3.5 06/26/2019 0838   CHOLHDL 6.4 09/12/2016 0256   VLDL 56 (H) 09/12/2016 0256   LDLCALC 90 06/26/2019 0838   LDLDIRECT 168.9 08/22/2010 0840    Physical Exam:    VS:  BP (!) 122/58   Pulse 81   Ht 5' (1.524 m)   Wt 169 lb 6.4 oz (76.8 kg)   SpO2 99%   BMI 33.08 kg/m     Wt Readings from Last 3 Encounters:  10/14/20 169 lb 6.4 oz (76.8 kg)  08/26/20 170 lb 3.2 oz (77.2 kg)  04/08/20 183 lb 4.8 oz (83.1 kg)     GEN: Well nourished, well developed in no acute distress HEENT: Normal NECK: No JVD; No carotid bruits LYMPHATICS: No lymphadenopathy CARDIAC: RRR, Positive murmurs, rubs, gallops RESPIRATORY:  Clear to auscultation without rales, wheezing or rhonchi  ABDOMEN: Soft, non-tender, non-distended MUSCULOSKELETAL: trace edema; No deformity  SKIN: Warm and dry NEUROLOGIC:  Alert and oriented x 3 PSYCHIATRIC:  Normal affect   ASSESSMENT AND PLAN:    1. Intermittent dizziness Patient again not orthostatic in  clinic.  She was not dizzy here.  Echocardiogram reassuring.  She was having intermittent headache with history of  stroke.  I have recommended neurological work-up with PCP/neurologist.  If ongoing issue will consider monitor in future.  She denies any palpitation or fluttering sensation. -  Orthostatic VS for the past 24 hrs:  BP- Lying Pulse- Lying BP- Sitting Pulse- Sitting BP- Standing at 0 minutes Pulse- Standing at 0 minutes  10/14/20 1523 119/75 75 118/78 82 108/65 79     2.  CAD -No angina.  Continue aspirin 81 mg daily, Plavix 75 mg daily and Crestor 40 mg daily.  3.  Hypertension -Blood pressure stable on current medications  4.  Volume overload -Patient with intermittent orthopnea.  Noted mild lower extremity edema on exam.  BNP was elevated at last office visit.  Echocardiogram with elevated LA Pressure.  Likely due to high salt diet intake.  Advised to cut back and try compression stocking.  Use as needed Lasix.  Medication Adjustments/Labs and Tests Ordered: Current medicines are reviewed at length with the patient today.  Concerns regarding medicines are outlined above.  No orders of the defined types were placed in this encounter.  No orders of the defined types were placed in this encounter.   Patient Instructions  Medication Instructions:  Your physician recommends that you continue on your current medications as directed. Please refer to the Current Medication list given to you today.  *If you need a refill on your cardiac medications before your next appointment, please call your pharmacy*   Lab Work: None ordered  If you have labs (blood work) drawn today and your tests are completely normal, you will receive your results only by: Marland Kitchen MyChart Message (if you have MyChart) OR . A paper copy in the mail If you have any lab test that is abnormal or we need to change your treatment, we will call you to review the results.   Testing/Procedures: None  ordered   Follow-Up: At [facility], you and your health needs are our priority.  As part of our continuing mission to provide you with exceptional heart care, we have created designated Provider Care Teams.  These Care Teams include your primary Cardiologist (physician) and Advanced Practice Providers (APPs -  Physician Assistants and Nurse Practitioners) who all work together to provide you with the care you need, when you need it.  We recommend signing up for the patient portal called "MyChart".  Sign up information is provided on this After Visit Summary.  MyChart is used to connect with patients for Virtual Visits (Telemedicine).  Patients are able to view lab/test results, encounter notes, upcoming appointments, etc.  Non-urgent messages can be sent to your provider as well.   To learn more about what you can do with MyChart, go to [email or web address].    Your next appointment:   4 month(s)  The format for your next appointment:   In Person  Provider:   You may see Tonny Bollman, MD or one of the following Advanced Practice Providers on your designated Care Team:    Tereso Newcomer, PA-C  Chelsea Aus, PA-C    Other Instructions      Signed, Manson Passey, [place]  10/14/2020 3:48 PM    [facility]

## 2020-10-14 NOTE — Patient Instructions (Signed)
Medication Instructions:  Your physician recommends that you continue on your current medications as directed. Please refer to the Current Medication list given to you today.  *If you need a refill on your cardiac medications before your next appointment, please call your pharmacy*   Lab Work: None ordered  If you have labs (blood work) drawn today and your tests are completely normal, you will receive your results only by: MyChart Message (if you have MyChart) OR A paper copy in the mail If you have any lab test that is abnormal or we need to change your treatment, we will call you to review the results.   Testing/Procedures: None ordered   Follow-Up: At [facility], you and your health needs are our priority.  As part of our continuing mission to provide you with exceptional heart care, we have created designated Provider Care Teams.  These Care Teams include your primary Cardiologist (physician) and Advanced Practice Providers (APPs -  Physician Assistants and Nurse Practitioners) who all work together to provide you with the care you need, when you need it.  We recommend signing up for the patient portal called "MyChart".  Sign up information is provided on this After Visit Summary.  MyChart is used to connect with patients for Virtual Visits (Telemedicine).  Patients are able to view lab/test results, encounter notes, upcoming appointments, etc.  Non-urgent messages can be sent to your provider as well.   To learn more about what you can do with MyChart, go to [email or web address].    Your next appointment:   4 month(s)  The format for your next appointment:   In Person  Provider:   You may see Michael Cooper, MD or one of the following Advanced Practice Providers on your designated Care Team:   Scott Weaver, PA-C Vin Bhagat, PA-C   Other Instructions   

## 2020-11-20 IMAGING — NM NM BONE 3 PHASE
8 series · 8 of 8 positions shown · non-contrast
Comparison: None

Radiographic correlation: None

CLINICAL DATA: RIGHT knee pain and swelling, had BILATERAL knee
replacement surgery in August 2007, has fallen several times in the
past year but does not remember injuring knee

EXAM:
NUCLEAR MEDICINE 3-PHASE BONE SCAN
TECHNIQUE: Radionuclide angiographic images, immediate static blood pool
images, and 3-hour delayed static images were obtained of the knees
after intravenous injection of radiopharmaceutical.
RADIOPHARMACEUTICALS:  21.3 mCi 3c-NNm MDP IV

[Series 2: blood pool · 2.07mm/px · 1 of 1 slices shown (1 of 2)]
[im 1/1  full-range]
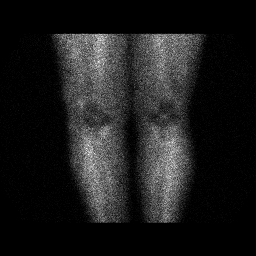

[Series 2: blood pool · 2.07mm/px · 1 of 1 slices shown (2 of 2)]
[im 1/1  full-range]
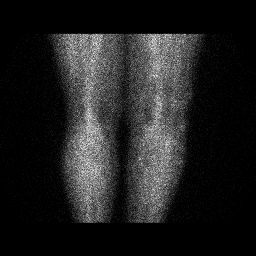

[Series 3: lat bp · 2.07mm/px · 1 of 1 slices shown (1 of 2)]
[im 1/1  full-range]
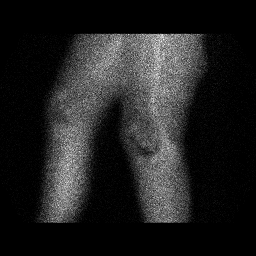

[Series 3: lat bp · 2.07mm/px · 1 of 1 slices shown (2 of 2)]
[im 1/1  full-range]
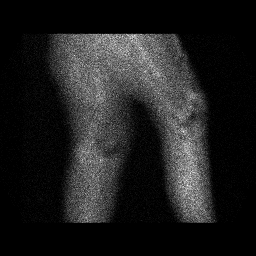

[Series 4: delay · delayed · 2.07mm/px · 1 of 1 slices shown (1 of 4)]
[im 1/1  full-range]
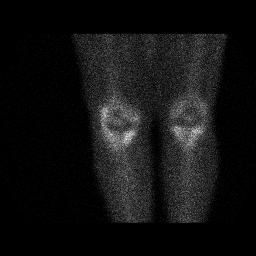

[Series 4: delay · delayed · 2.07mm/px · 1 of 1 slices shown (2 of 4)]
[im 1/1  full-range]
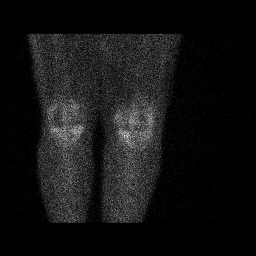

[Series 5: delay · delayed · 2.07mm/px · 1 of 1 slices shown (3 of 4)]
[im 1/1  full-range]
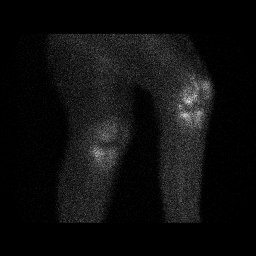

[Series 5: delay · delayed · 2.07mm/px · 1 of 1 slices shown (4 of 4)]
[im 1/1  full-range]
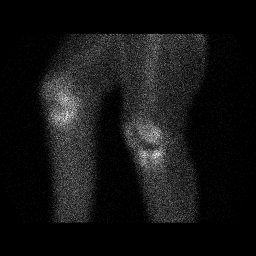

[8 of 8 positions shown; findings below may reference images not displayed]

FINDINGS: Vascular phase: Minimally increased blood flow to the RIGHT knee
versus LEFT

Blood pool phase: Minimally increased blood pool adjacent to RIGHT
knee prosthesis. Normal blood pool LEFT knee

Delayed phase: Photopenic defects at both knees from knee
prostheses. Mild tracer uptake in LEFT knee adjacent to tibial
component of prosthesis, nonspecific in the setting of an
asymptomatic knee. On the RIGHT, focal increased tracer uptake is
seen at the medial tibial plateau and at the lateral femoral condyle
adjacent to prosthetic components concerning for aseptic loosening.
IMPRESSION: Abnormal tracer accumulation adjacent to the RIGHT knee prosthesis
suspicious for aseptic loosening.

Minimal uptake of tracer adjacent to the tibial component of the
LEFT knee prosthesis, nonspecific in the setting of an asymptomatic
knee.

## 2021-02-16 NOTE — Progress Notes (Addendum)
Cardiology Office Note:    Date:  02/17/2021   ID:  [name], DOB 03-Dec-1950, MRN [id number]  PCP:  Imagene Riches, NP   [place]  Cardiologist:  Sherren Mocha, MD   Electrophysiologist:  None       Referring MD: Imagene Riches, NP   Chief Complaint:  Follow-up (CAD, AS, CHF)    Patient Profile:     [name] is a 70 y.o. female with:   Coronary artery disease s/p CABG w/ chronic angina ? S/p MI 2007 >> DES x 2 to RCA; Inf STEMI 08/2011 >> Tx med ? S/p NSTEMI 01/2012 >> s/p CABG ? NSTEMI 12/2015 >> all VG's occluded, L-LAD ok; unsuccessful PCI of SVG  Aortic Stenosis  Echocardiogram 10/21: mild AS (mean 14 mmHg)  (HFpEF) heart failure with preserved ejection fraction   Echocardiogram 10/21: EF 55-60, Gr 1 DD  Hypertension   Hyperlipidemia   COPD  Nephrolithiasis  Admx to [facility] in 10/21 with AKI 2/2 obstructive uropathy  Prior CV studies:   Echocardiogram 08/29/20 EF 55-60, no RWMA, mod conc LVH, Gr 1 DD, GLS -19.9%, normal RVSF, RVSP 23.8, mod LAE, mod MAC, AV calcification, mild AI, mild AS (mean 14 mmHg, Vmax 231 cm/s, DI 0.38)  Myoview 10/13/18 EF 73; soft tissue atten, no ischemia  Echo 10/10/18 Mod conc LVH, EF 50-55, inf KAK and aneurysmal deformity, Gr 1 DD, mod AoV thickening, mild AS (mean 9), mod MAC, mild LAE  Cardiac Catheterization 09/11/16 LAD prox 100 RI (small ) 90 OM1 100 RCA prox 100, mid 100 S-PDA/PLA 100 S-RI, OM1 100 L-LAD patent EF 50      History of Present Illness:    [name] was last seen in clinic in 12/21 by Leanor Kail, PA-C.  She returns for f/u.  She is her with her husband.  She has another kidney stone and has a procedure scheduled next week in [place].  She has chronic angina.  Her symptoms are overall stable.  She has not had to use NTG. She has more symptoms when she is in pain from her kidney stone.  She has not had significant shortness of breath. She has not  had further syncope.  She sleeps on an incline chronically.  She has not had significant edema.         Past Medical History:  Diagnosis Date  . ALLERGIC RHINITIS   . ANEMIA-IRON DEFICIENCY   . ANXIETY   . ASTHMA   . BREAST MASS, LEFT   . CAD (coronary artery disease)    1. s/p inf MI 2007 c/b low BP and brady, req temp pacer and pressors;  tx with DES x 2 to RCA;  2. inf STEMI 10/12 - PL occluded but small, med Rx recommended;  3.  NSTEMI 01/2012 - LHC with 3v CAD adn EF 45%; Echo with mild LVH, inf HK, EF 55-605, grade 1 diast dysfxn, mild AS;  4. s/p CABG 02/04/12: L-LAD, S-RI, S-OM1, S-PD/PLVB ([place]) 5. NSTEMI 09/11/16 patent LIMA to LAD, EF 55-60%  . COLONIC POLYPS, HX OF   . COPD   . DIVERTICULITIS, HX OF   . FATIGUE, CHRONIC   . FIBROMYALGIA   . GERD   . GLUCOSE INTOLERANCE   . HYPERLIPIDEMIA 08/23/2007  . HYPERTENSION 08/23/2007  . HYPOTHYROIDISM   . IBS   . LOW BACK PAIN   . MENOPAUSAL DISORDER   . OBESITY   . OSTEOARTHRITIS   .  PERIPHERAL EDEMA   . PERIPHERAL VASCULAR DISEASE 07/15/2009  . RENAL INSUFFICIENCY 03/02/2009  . Somatization disorder 08/23/2007  . Stroke [facility])     Current Medications: Current Meds  Medication Sig  . albuterol (PROVENTIL HFA;VENTOLIN HFA) 108 (90 BASE) MCG/ACT inhaler Inhale 2 puffs into the lungs every 6 (six) hours as needed. Shortness of breath  . amLODipine (NORVASC) 10 MG tablet Take 1 tablet by mouth once daily  . aspirin 81 MG tablet Take 1 tablet (81 mg total) daily by mouth.  . carvedilol (COREG) 25 MG tablet Take 25 mg by mouth daily.  . clopidogrel (PLAVIX) 75 MG tablet Take 1 tablet by mouth once daily  . diphenoxylate-atropine (LOMOTIL) 2.5-0.025 MG tablet   . furosemide (LASIX) 40 MG tablet Take 40 mg by mouth daily as needed for fluid or edema (only when needed for swelling or 3lbs weight gain for a day).  Marland Kitchen levothyroxine (SYNTHROID) 112 MCG tablet Take 112 mcg by mouth daily.  . Loratadine-Pseudoephedrine (CLARITIN-D 24  HOUR PO) Take 1 tablet by mouth daily.  . nitroGLYCERIN (NITROSTAT) 0.4 MG SL tablet Place 0.4 mg under the tongue every 5 (five) minutes as needed for chest pain.  Marland Kitchen nystatin (MYCOSTATIN/NYSTOP) powder Apply 1 application 4 (four) times daily as needed topically. Skin irritation  . pantoprazole (PROTONIX) 40 MG tablet Take 1 tablet by mouth twice daily  . potassium chloride SA (K-DUR,KLOR-CON) 20 MEQ tablet Take 20 mEq by mouth as needed (when takes lasix).  . tamsulosin (FLOMAX) 0.4 MG CAPS capsule Take 0.4 mg by mouth daily.  . traMADol (ULTRAM) 50 MG tablet Take 50 mg 2 (two) times daily as needed by mouth. Headache     Allergies:   Ciprofloxacin and Sulfonamide derivatives   Social History   Tobacco Use  . Smoking status: Never Smoker  . Smokeless tobacco: Never Used  Vaping Use  . Vaping Use: Never used  Substance Use Topics  . Alcohol use: No  . Drug use: No     Family Hx: The patient's family history is not on file.  ROS   EKGs/Labs/Other Test Reviewed:    EKG:  EKG is not ordered today.  The ekg ordered today demonstrates n/a  Recent Labs: 08/26/2020: ALT 27; BUN 8; Creatinine, Ser 0.94; Hemoglobin 11.5; NT-Pro BNP 701; Platelets 284; Potassium 3.6; Sodium 143   Recent Lipid Panel Lab Results  Component Value Date/Time   CHOL 169 06/26/2019 08:38 AM   TRIG 157 (H) 06/26/2019 08:38 AM   HDL 48 06/26/2019 08:38 AM   CHOLHDL 3.5 06/26/2019 08:38 AM   CHOLHDL 6.4 09/12/2016 02:56 AM   LDLCALC 90 06/26/2019 08:38 AM   LDLDIRECT 168.9 08/22/2010 08:40 AM      Risk Assessment/Calculations:      Physical Exam:    VS:  BP 140/64   Pulse 76   Ht 5' (1.524 m)   Wt 169 lb 12.8 oz (77 kg)   SpO2 93%   BMI 33.16 kg/m     Wt Readings from Last 3 Encounters:  02/17/21 169 lb 12.8 oz (77 kg)  10/14/20 169 lb 6.4 oz (76.8 kg)  08/26/20 170 lb 3.2 oz (77.2 kg)     Constitutional:      Appearance: Healthy appearance. Not in distress.  Pulmonary:     Effort:  Pulmonary effort is normal.     Breath sounds: No wheezing. No rales.  Cardiovascular:     Normal rate. Regular rhythm. Normal S1. Normal S2.  Murmurs: There is a grade 2/6 harsh crescendo-decrescendo midsystolic murmur.  Edema:    Ankle: bilateral trace edema of the ankle. Abdominal:     Palpations: Abdomen is soft.  Musculoskeletal:     Cervical back: Neck supple. Skin:    General: Skin is warm and dry.  Neurological:     Mental Status: Alert and oriented to person, place and time.     Cranial Nerves: Cranial nerves are intact.          ASSESSMENT & PLAN:    1. Coronary artery disease involving native coronary artery of native heart with angina pectoris ([place]) History of CABG.  Cardiac catheterization in 2017 demonstrated all vein grafts occluded.  She had an unsuccessful attempt at PCI of the SVG-PDA.  LIMA-LAD remained patent.  She has chronic angina.  Her symptoms are overall stable without significant worsening.  She has another kidney stone and has a procedure scheduled next week.  Blood pressures at home tend to run in the 110s/60s.  At this point, I do not think we need to adjust her medical therapy.  Continue current dose of amlodipine, aspirin, carvedilol, clopidogrel, rosuvastatin.  Follow-up in 6 months.    2. Chronic heart failure with preserved ejection fraction ([place]) EF 55-60 by echocardiogram 10/21.  Overall, volume is stable.  Continue current management.  3. Essential hypertension Blood pressures at home are optimal.  Continue current therapy.  4. Mixed hyperlipidemia LDL in 2020 was 90.  She was intolerant of ezetimibe.  She is on high-dose rosuvastatin.  Arrange follow-up CMET, fasting lipids.  If LDL remains >70, refer to Pharm.[facility]. lipid clinic for consideration of PCSK9 inhibitor therapy.  5. Nonrheumatic aortic valve stenosis Mild aortic stenosis by echocardiogram 10/21 with mean gradient 14.  Her murmur is fairly prominent.  We may want to consider  repeating her echocardiogram again in the next 1 year.   Dispo:  Return in about 6 months (around 08/19/2021) for Routine 6 month follow up with Dr. Burt Knack..   Medication Adjustments/Labs and Tests Ordered: Current medicines are reviewed at length with the patient today.  Concerns regarding medicines are outlined above.  Tests Ordered: Orders Placed This Encounter  Procedures  . Comp Met (CMET)  . Lipid Profile   Medication Changes: No orders of the defined types were placed in this encounter.   Signed, Richardson Dopp, PA-C  02/17/2021 1:20 PM    [facility] [place], [place], [place]  [address] Phone: [phone]; Fax: [phone]

## 2021-02-17 ENCOUNTER — Encounter: Payer: Self-pay | Admitting: Physician Assistant

## 2021-02-17 ENCOUNTER — Ambulatory Visit (INDEPENDENT_AMBULATORY_CARE_PROVIDER_SITE_OTHER): Payer: Medicare Other | Admitting: Physician Assistant

## 2021-02-17 ENCOUNTER — Other Ambulatory Visit: Payer: Self-pay

## 2021-02-17 VITALS — BP 140/64 | HR 76 | Ht 60.0 in | Wt 169.8 lb

## 2021-02-17 DIAGNOSIS — E782 Mixed hyperlipidemia: Secondary | ICD-10-CM

## 2021-02-17 DIAGNOSIS — I5032 Chronic diastolic (congestive) heart failure: Secondary | ICD-10-CM

## 2021-02-17 DIAGNOSIS — I25119 Atherosclerotic heart disease of native coronary artery with unspecified angina pectoris: Secondary | ICD-10-CM

## 2021-02-17 DIAGNOSIS — I10 Essential (primary) hypertension: Secondary | ICD-10-CM | POA: Diagnosis not present

## 2021-02-17 DIAGNOSIS — I350 Nonrheumatic aortic (valve) stenosis: Secondary | ICD-10-CM

## 2021-02-17 DIAGNOSIS — I2510 Atherosclerotic heart disease of native coronary artery without angina pectoris: Secondary | ICD-10-CM

## 2021-02-17 NOTE — Patient Instructions (Signed)
Medication Instructions:  Your physician recommends that you continue on your current medications as directed. Please refer to the Current Medication list given to you today.  *If you need a refill on your cardiac medications before your next appointment, please call your pharmacy*   Lab Work: Your physician recommends that you return for a FASTING lipid profile/cmet.  Come in between 7:30 - 4:30 fasting from the midnight the night before.   If you have labs (blood work) drawn today and your tests are completely normal, you will receive your results only by: Marland Kitchen MyChart Message (if you have MyChart) OR . A paper copy in the mail If you have any lab test that is abnormal or we need to change your treatment, we will call you to review the results.   Testing/Procedures: -None   Follow-Up: At [facility], you and your health needs are our priority.  As part of our continuing mission to provide you with exceptional heart care, we have created designated Provider Care Teams.  These Care Teams include your primary Cardiologist (physician) and Advanced Practice Providers (APPs -  Physician Assistants and Nurse Practitioners) who all work together to provide you with the care you need, when you need it.  We recommend signing up for the patient portal called "MyChart".  Sign up information is provided on this After Visit Summary.  MyChart is used to connect with patients for Virtual Visits (Telemedicine).  Patients are able to view lab/test results, encounter notes, upcoming appointments, etc.  Non-urgent messages can be sent to your provider as well.   To learn more about what you can do with MyChart, go to [email or web address].    Your next appointment:   6 month(s)  The format for your next appointment:   In Person  Provider:   Tonny Bollman, MD   Other Instructions Your physician wants you to follow-up in: 6 month with Dr.Cooper.  You will receive a reminder letter in the mail two  months in advance. If you don't receive a letter, please call our office to schedule the follow-up appointment.  Will request most recent labs from PCP.

## 2021-05-05 ENCOUNTER — Other Ambulatory Visit: Payer: Medicare Other

## 2021-06-12 ENCOUNTER — Other Ambulatory Visit: Payer: Self-pay | Admitting: Cardiovascular Disease

## 2021-12-12 ENCOUNTER — Other Ambulatory Visit: Payer: Self-pay | Admitting: Cardiovascular Disease

## 2022-11-15 ENCOUNTER — Other Ambulatory Visit: Payer: Self-pay | Admitting: Cardiovascular Disease

## 2023-02-02 ENCOUNTER — Other Ambulatory Visit: Payer: Self-pay | Admitting: Cardiovascular Disease

## 2023-02-04 ENCOUNTER — Other Ambulatory Visit: Payer: Self-pay

## 2023-02-04 MED ORDER — PANTOPRAZOLE SODIUM 40 MG PO TBEC: DELAYED_RELEASE_TABLET | ORAL | 0 refills | Status: AC

## 2023-03-01 ENCOUNTER — Other Ambulatory Visit: Payer: Self-pay | Admitting: Cardiovascular Disease

## 2023-03-23 LAB — LAB REPORT - SCANNED: EGFR: 59.2

## 2023-09-11 ENCOUNTER — Encounter (HOSPITAL_COMMUNITY): Payer: Self-pay | Admitting: Pharmacy Technician

## 2023-09-11 ENCOUNTER — Other Ambulatory Visit: Payer: Self-pay

## 2023-09-11 ENCOUNTER — Emergency Department (HOSPITAL_COMMUNITY): Payer: Medicare Other

## 2023-09-11 ENCOUNTER — Emergency Department (HOSPITAL_COMMUNITY)
Admission: EM | Admit: 2023-09-11 | Discharge: 2023-09-11 | Disposition: A | Discharge status: Home / Self Care | Payer: Medicare Other | Attending: Emergency Medicine | Admitting: Emergency Medicine

## 2023-09-11 DIAGNOSIS — Z951 Presence of aortocoronary bypass graft: Secondary | ICD-10-CM | POA: Diagnosis not present

## 2023-09-11 DIAGNOSIS — I2510 Atherosclerotic heart disease of native coronary artery without angina pectoris: Secondary | ICD-10-CM | POA: Insufficient documentation

## 2023-09-11 DIAGNOSIS — Y9301 Activity, walking, marching and hiking: Secondary | ICD-10-CM | POA: Insufficient documentation

## 2023-09-11 DIAGNOSIS — S0990XA Unspecified injury of head, initial encounter: Secondary | ICD-10-CM | POA: Diagnosis present

## 2023-09-11 DIAGNOSIS — W19XXXA Unspecified fall, initial encounter: Secondary | ICD-10-CM

## 2023-09-11 DIAGNOSIS — Z7901 Long term (current) use of anticoagulants: Secondary | ICD-10-CM | POA: Diagnosis not present

## 2023-09-11 DIAGNOSIS — S060X0A Concussion without loss of consciousness, initial encounter: Secondary | ICD-10-CM | POA: Diagnosis not present

## 2023-09-11 DIAGNOSIS — Z7982 Long term (current) use of aspirin: Secondary | ICD-10-CM | POA: Insufficient documentation

## 2023-09-11 DIAGNOSIS — W010XXA Fall on same level from slipping, tripping and stumbling without subsequent striking against object, initial encounter: Secondary | ICD-10-CM | POA: Diagnosis not present

## 2023-09-11 LAB — COMPREHENSIVE METABOLIC PANEL
ALT: 13 U/L (ref 0–44)
AST: 16 U/L (ref 15–41)
Albumin: 3.4 g/dL — ABNORMAL LOW (ref 3.5–5.0)
Alkaline Phosphatase: 76 U/L (ref 38–126)
Anion gap: 11 (ref 5–15)
BUN: 14 mg/dL (ref 8–23)
CO2: 25 mmol/L (ref 22–32)
Calcium: 9.2 mg/dL (ref 8.9–10.3)
Chloride: 104 mmol/L (ref 98–111)
Creatinine, Ser: 1.17 mg/dL — ABNORMAL HIGH (ref 0.44–1.00)
GFR, Estimated: 50 mL/min — ABNORMAL LOW (ref >60)
Glucose, Bld: 126 mg/dL — ABNORMAL HIGH (ref 70–99)
Potassium: 3.5 mmol/L (ref 3.5–5.1)
Sodium: 140 mmol/L (ref 135–145)
Total Bilirubin: 1.2 mg/dL — ABNORMAL HIGH (ref <1.2)
Total Protein: 6.6 g/dL (ref 6.5–8.1)

## 2023-09-11 LAB — CBC
HCT: 40 % (ref 36.0–46.0)
Hemoglobin: 12.6 g/dL (ref 12.0–15.0)
MCH: 29 pg (ref 26.0–34.0)
MCHC: 31.5 g/dL (ref 30.0–36.0)
MCV: 92.2 fL (ref 80.0–100.0)
Platelets: 273 10*3/uL (ref 150–400)
RBC: 4.34 MIL/uL (ref 3.87–5.11)
RDW: 13.9 % (ref 11.5–15.5)
WBC: 9.1 10*3/uL (ref 4.0–10.5)
nRBC: 0 % (ref 0.0–0.2)

## 2023-09-11 LAB — TROPONIN I (HIGH SENSITIVITY)
Troponin I (High Sensitivity): 17 ng/L (ref <18)
Troponin I (High Sensitivity): 19 ng/L — ABNORMAL HIGH (ref <18)

## 2023-09-11 MED ORDER — ACETAMINOPHEN 500 MG PO TABS
1000.0000 mg | ORAL_TABLET | Freq: Once | ORAL | Status: AC
  Administered 2023-09-11: 1000 mg via ORAL
  Filled 2023-09-11: qty 2

## 2023-09-11 NOTE — Discharge Instructions (Addendum)
Your CT of the head today did not show any signs of a brain bleed, no skull fractures.  Your chest x-ray did not show any signs of a rib fracture or punctured lung.  The x-rays of your hands and wrist did not show any signs of a fracture or dislocation.  I suspect your symptoms are secondary to a concussion.  Please refrain from any activities that require a lot of concentration over the next couple of days and reduce screen (TV/phone) time.  Please at least 8 hours of sleep each night.  You may take up to 1000mg  of tylenol every 6 hours as needed for pain.  Do not take more then 4g per day.  Your labs today showed that your creatinine which is a measure of kidney function was elevated slightly above your normal. Please keep well hydrated as this sometimes can cause the creatinine to be higher than normal. Please have this rechecked with your PCP this week.  You did have an abnormal rhythm on your EKG today called [place], but no signs of a heart attack. Your cardiac enzyme stayed about the same on the initial and repeat which is reassuring. Please follow up with your cardiologist within the next 2 weeks to ensure your symptoms are improving.  Follow-up with your PCP and cardiologist within the next week for a recheck of your symptoms.  Return to the ER for any uncontrolled vomiting, changes in vision, severe headache, cheat pain, shortness of breath, or any other new or concerning symptoms.

## 2023-09-11 NOTE — ED Notes (Signed)
Walked patient to the bathroom patient did well patient is now back in bed on the monitor with call bell in reach and family at bedside 

## 2023-09-11 NOTE — ED Provider Notes (Signed)
[facility] Provider Note   CSN: [id number] Arrival date & time: 09/11/23  1115     History  Chief Complaint  Patient presents with   Marletta Lor    [name] is a 72 y.o. female with history of CAD with CABG x 5 on plavix, who presents with for a mechanical fall yesterday.  States she was walking and got her toe tripped in a rug and fell onto the floor. She hit her head but denies any loss of consciousness.  She reports she had a nosebleed initially, but otherwise felt okay.  Today, reports feeling more dizzy than normal.  Also reports some pain in her rib cage and wrists bilaterally.  Last dose of Plavix was yesterday morning.  Denies any shortness of breath   Fall       Home Medications Prior to Admission medications   Medication Sig Start Date End Date Taking? Authorizing Provider  albuterol (PROVENTIL HFA;VENTOLIN HFA) 108 (90 BASE) MCG/ACT inhaler Inhale 2 puffs into the lungs every 6 (six) hours as needed. Shortness of breath    [provider]  amLODipine (NORVASC) 10 MG tablet Take 1 tablet by mouth once daily 06/12/21   Tonny Bollman, MD  aspirin 81 MG tablet Take 1 tablet (81 mg total) daily by mouth. 09/12/17   Tonny Bollman, MD  carvedilol (COREG) 25 MG tablet Take 25 mg by mouth daily.    [provider]  clopidogrel (PLAVIX) 75 MG tablet Take 1 tablet by mouth once daily 07/24/19   Rosalio Macadamia, NP  diphenoxylate-atropine (LOMOTIL) 2.5-0.025 MG tablet  08/19/20   [provider]  furosemide (LASIX) 40 MG tablet Take 40 mg by mouth daily as needed for fluid or edema (only when needed for swelling or 3lbs weight gain for a day).    [provider]  levothyroxine (SYNTHROID) 112 MCG tablet Take 112 mcg by mouth daily. 07/26/20   [provider]  Loratadine-Pseudoephedrine (CLARITIN-D 24 HOUR PO) Take 1 tablet by mouth daily.    [provider]  nitroGLYCERIN  (NITROSTAT) 0.4 MG SL tablet Place 0.4 mg under the tongue every 5 (five) minutes as needed for chest pain.    [provider]  nystatin (MYCOSTATIN/NYSTOP) powder Apply 1 application 4 (four) times daily as needed topically. Skin irritation 11/17/14   [provider]  pantoprazole (PROTONIX) 40 MG tablet TAKE 1 TABLET BY MOUTH TWICE DAILY 02/04/23   Tonny Bollman, MD  potassium chloride SA (K-DUR,KLOR-CON) 20 MEQ tablet Take 20 mEq by mouth as needed (when takes lasix).    [provider]  rosuvastatin (CRESTOR) 40 MG tablet Take 1 tablet (40 mg total) by mouth daily. 10/27/18 08/26/20  Tonny Bollman, MD  tamsulosin (FLOMAX) 0.4 MG CAPS capsule Take 0.4 mg by mouth daily. 08/21/20   [provider]  traMADol (ULTRAM) 50 MG tablet Take 50 mg 2 (two) times daily as needed by mouth. Headache 08/31/17   [provider]      Allergies    Ciprofloxacin and Sulfonamide derivatives    Review of Systems   Review of Systems  Neurological:  Negative for dizziness.    Physical Exam Updated Vital Signs BP (!) 129/49   Pulse 69   Temp 98.3 F (36.8 C)   Resp 16   SpO2 96%  Physical Exam Vitals and nursing note reviewed.  Constitutional:      General: She is not in acute distress.  Appearance: She is well-developed. She is not diaphoretic.  HENT:     Head: Normocephalic and atraumatic.  Eyes:     Extraocular Movements: Extraocular movements intact.     Conjunctiva/sclera: Conjunctivae normal.     Pupils: Pupils are equal, round, and reactive to light.  Neck:     Comments: Able to rotate neck left and right 45 degrees without difficulty Cardiovascular:     Rate and Rhythm: Normal rate and regular rhythm.     Heart sounds: No murmur heard. Pulmonary:     Effort: Pulmonary effort is normal. No respiratory distress.     Breath sounds: Normal breath sounds.  Abdominal:     Palpations: Abdomen is soft.     Tenderness: There is no abdominal  tenderness.  Musculoskeletal:        General: No swelling.     Cervical back: Neck supple.     Comments: No spinous process tenderness palpation Able to ambulate without difficulty  Bilateral upper extremities: No obvious deformity, erythema.  There is slight edema and ecchymosis over the dorsal aspect of the hands bilaterally  No tenderness to the radius, ulna, carpal bones, 1st through 5th metacarpals, 1st through 5th phalanges of the right and left lower extremity  Able to fully flex and extend wrist bilaterally, able to fully flex and extend 1st through 5th MCP, PIP, DIPs of the right and left hand  2+ radial pulse bilaterally   Tender to palpation of the anterior right lower rib cage    Skin:    General: Skin is warm and dry.     Capillary Refill: Capillary refill takes less than 2 seconds.     Comments: Nose with small 1 cm abrasion, scabbed over without surrounding erythema  Neurological:     General: No focal deficit present.     Mental Status: She is alert.  Psychiatric:        Mood and Affect: Mood normal.     ED Results / Procedures / Treatments   Labs (all labs ordered are listed, but only abnormal results are displayed) Labs Reviewed  COMPREHENSIVE METABOLIC PANEL - Abnormal; Notable for the following components:      Result Value   Glucose, Bld 126 (*)    Creatinine, Ser 1.17 (*)    Albumin 3.4 (*)    Total Bilirubin 1.2 (*)    GFR, Estimated 50 (*)    All other components within normal limits  TROPONIN I (HIGH SENSITIVITY) - Abnormal; Notable for the following components:   Troponin I (High Sensitivity) 19 (*)    All other components within normal limits  CBC  TROPONIN I (HIGH SENSITIVITY)    EKG EKG Interpretation Date/Time:  Wednesday September 11 2023 15:01:07 EST Ventricular Rate:  61 PR Interval:  176 QRS Duration:  101 QT Interval:  486 QTC Calculation: 490 R Axis:   70  Text Interpretation: Sinus rhythm Ventricular bigeminy Abnormal  R-wave progression, early transition Borderline T abnormalities, diffuse leads Borderline prolonged QT interval Confirmed by Vivi Barrack [phone]) on 09/11/2023 3:42:54 PM  Radiology DG Wrist Complete Right  Result Date: 09/11/2023 CLINICAL DATA:  Fall.  Bilateral hand and wrist pain. EXAM: RIGHT WRIST - COMPLETE 3+ VIEW; RIGHT HAND - COMPLETE 3+ VIEW COMPARISON:  None Available. FINDINGS: Right wrist: There is 2 mm ulnar positive variance. Minimal triscaphe and thumb carpometacarpal joint space narrowing. Right hand: Mild thumb interphalangeal and second through fifth DIP joint space narrowing and peripheral osteophytosis. Mild degenerative spurring at  the distal medial aspect of the index finger and fifth finger metacarpal heads. No acute fracture or dislocation. IMPRESSION: 1. No acute fracture. 2. Mild osteoarthritis of the thumb interphalangeal and second through fifth DIP joints. Electronically Signed   By: Neita Garnet M.D.   On: 09/11/2023 14:51   DG Hand Complete Right  Result Date: 09/11/2023 CLINICAL DATA:  Fall.  Bilateral hand and wrist pain. EXAM: RIGHT WRIST - COMPLETE 3+ VIEW; RIGHT HAND - COMPLETE 3+ VIEW COMPARISON:  None Available. FINDINGS: Right wrist: There is 2 mm ulnar positive variance. Minimal triscaphe and thumb carpometacarpal joint space narrowing. Right hand: Mild thumb interphalangeal and second through fifth DIP joint space narrowing and peripheral osteophytosis. Mild degenerative spurring at the distal medial aspect of the index finger and fifth finger metacarpal heads. No acute fracture or dislocation. IMPRESSION: 1. No acute fracture. 2. Mild osteoarthritis of the thumb interphalangeal and second through fifth DIP joints. Electronically Signed   By: Neita Garnet M.D.   On: 09/11/2023 14:51   DG Wrist Complete Left  Result Date: 09/11/2023 CLINICAL DATA:  Fall.  Bilateral hand and wrist pain. EXAM: LEFT WRIST - COMPLETE 3+ VIEW; LEFT HAND - COMPLETE 3+ VIEW COMPARISON:   Left hand radiographs 02/22/2018 FINDINGS: Left wrist: 3 mm ulnar positive variance. Mild distal radial styloid degenerative spurring, unchanged. Very mild triscaphe and thumb carpometacarpal joint space narrowing. Left hand: A pulse oximeter obscures portions of the distal phalanx of the third finger. No acute fracture or dislocation. Mild degenerative spurring at the dorsal base of the second through fifth distal phalanges. Resolution of the prior soft tissue swelling. IMPRESSION: 1. No acute fracture. 2. Mild triscaphe and thumb carpometacarpal osteoarthritis. Electronically Signed   By: Neita Garnet M.D.   On: 09/11/2023 14:49   DG Hand Complete Left  Result Date: 09/11/2023 CLINICAL DATA:  Fall.  Bilateral hand and wrist pain. EXAM: LEFT WRIST - COMPLETE 3+ VIEW; LEFT HAND - COMPLETE 3+ VIEW COMPARISON:  Left hand radiographs 02/22/2018 FINDINGS: Left wrist: 3 mm ulnar positive variance. Mild distal radial styloid degenerative spurring, unchanged. Very mild triscaphe and thumb carpometacarpal joint space narrowing. Left hand: A pulse oximeter obscures portions of the distal phalanx of the third finger. No acute fracture or dislocation. Mild degenerative spurring at the dorsal base of the second through fifth distal phalanges. Resolution of the prior soft tissue swelling. IMPRESSION: 1. No acute fracture. 2. Mild triscaphe and thumb carpometacarpal osteoarthritis. Electronically Signed   By: Neita Garnet M.D.   On: 09/11/2023 14:49   CT HEAD WO CONTRAST  Result Date: 09/11/2023 CLINICAL DATA:  Fall on blood thinners, head injury and headache EXAM: CT HEAD WITHOUT CONTRAST TECHNIQUE: Contiguous axial images were obtained from the base of the skull through the vertex without intravenous contrast. RADIATION DOSE REDUCTION: This exam was performed according to the departmental dose-optimization program which includes automated exposure control, adjustment of the mA and/or kV according to patient size  and/or use of iterative reconstruction technique. COMPARISON:  None Available. FINDINGS: Brain: No evidence of acute infarction, hemorrhage, mass, mass effect, or midline shift. No hydrocephalus or acute extra-axial fluid collection. Periventricular white matter changes, likely the sequela of chronic small vessel ischemic disease. Arachnoid cysts overlying the mid to posterior right frontal lobe and in the left posterior fossa. Vascular: No hyperdense vessel. Atherosclerotic calcifications in the intracranial carotid and vertebral arteries. Skull: Negative for fracture or focal lesion. Sinuses/Orbits: No acute finding. Other: The mastoid air cells are well  aerated. IMPRESSION: No acute intracranial process. Electronically Signed   By: Wiliam Ke M.D.   On: 09/11/2023 14:21   DG Chest 2 View  Result Date: 09/11/2023 CLINICAL DATA:  Chest pain. EXAM: CHEST - 2 VIEW COMPARISON:  09/11/2016. FINDINGS: Bilateral lung fields are clear. Bilateral costophrenic angles are clear. Normal cardio-mediastinal silhouette. There are surgical staples along the heart border and sternotomy wires, status post CABG (coronary artery bypass graft). No acute osseous abnormalities. The soft tissues are within normal limits. IMPRESSION: *No active cardiopulmonary disease. Electronically Signed   By: Jules Schick M.D.   On: 09/11/2023 14:03    Procedures Procedures    Medications Ordered in ED Medications  acetaminophen (TYLENOL) tablet 1,000 mg (1,000 mg Oral Given 09/11/23 1300)    ED Course/ Medical Decision Making/ A&P                                 Medical Decision Making Amount and/or Complexity of Data Reviewed Labs: ordered. Radiology: ordered.  Risk OTC drugs.     Differential diagnosis includes but is not limited to fracture, dislocation, intracranial hemorrhage, contusion, ACS, pneumothorax  ED Course:  Patient overall well-appearing, no acute distress.  Vital signs stable.  She is able to  ambulate without difficulty.  No focal neurological deficits.  She reports some nausea, no active emesis.  CT head with no signs of intracranial hemorrhage or other abnormality.  X-ray of the chest with no signs of rib fracture or pneumothorax.  X-ray of the hands and wrist bilaterally without any sign of fracture or dislocation.  She has full range of motion of the hands and wrists, neurovascular intact.  Patient reported some chest pain in triage, this was localized to tenderness of the right lower rib cage.   Denies any exertional chest pain or shortness of breath.  Her EKG with no signs of STEMI but with some bigemy. EKG remained flat at 17 and 19. No concern for ACS at this time. Reviewed case with Dr. Jearld Fenton who is also in agreement and feels patient can follow outpatient with her cardiologist.  Upon re-evaluation, patient still well-appearing.  States Tylenol has helped her pain.  Vital signs stable.  Appropriate for discharge at this time.  Impression: Mechanical fall Concussion  Disposition:  The patient was discharged home with instructions to follow-up with PCP within the next week.  Refrain from any Return precautions given.  Lab Tests: I Ordered, and personally interpreted labs.  The pertinent results include:   CMP with creatinine at 1.7 Initial troponin 17, repeat 19 CBC all within normal limits  Imaging Studies ordered: I ordered imaging studies including CT head, chest x-ray, x-ray right wrist and hand, x-ray left wrist and hand I independently visualized the imaging with scope of interpretation limited to determining acute life threatening conditions related to emergency care. Imaging showed no intracranial hemorrhage, no fractures or dislocations I agree with the radiologist interpretation   Cardiac Monitoring: / EKG: The patient was maintained on a cardiac monitor.  I personally viewed and interpreted the cardiac monitored which showed an underlying rhythm of: bigeminy                   Final Clinical Impression(s) / ED Diagnoses Final diagnoses:  Fall, initial encounter  Concussion without loss of consciousness, initial encounter    Rx / DC Orders ED Discharge Orders     None  Arabella Merles, PA-C 09/11/23 1606    Loetta Rough, MD 09/12/23 [phone]

## 2023-09-11 NOTE — ED Triage Notes (Signed)
Pt here from PCP for evaluation after fall yesterday. Pt tripped and hit her head on hardwood floors, takes plavix. Pt had a nose bleed yesterday along with a headache. Pt also with ribcage pain and bilateral hand pain with discoloration. At PCP, pt had abnormal EKG and complaints of dizziness, PCP wants electrolyte and cardiac workup.

## 2023-10-15 DIAGNOSIS — I517 Cardiomegaly: Secondary | ICD-10-CM

## 2024-10-14 ENCOUNTER — Ambulatory Visit: Admitting: Orthopedic Surgery
